# Patient Record
Sex: Female | Born: 1990 | Race: Black or African American | Hispanic: No | Marital: Single | Smoking: Never smoker
Health system: Southern US, Community
[De-identification: ages and names within clinical notes are randomized; demographics above are authoritative.]

## PROBLEM LIST (undated history)

## (undated) ENCOUNTER — Inpatient Hospital Stay (HOSPITAL_COMMUNITY): Payer: Self-pay

## (undated) DIAGNOSIS — F419 Anxiety disorder, unspecified: Secondary | ICD-10-CM

## (undated) DIAGNOSIS — R519 Headache, unspecified: Secondary | ICD-10-CM

## (undated) DIAGNOSIS — R51 Headache: Secondary | ICD-10-CM

## (undated) DIAGNOSIS — B999 Unspecified infectious disease: Secondary | ICD-10-CM

## (undated) DIAGNOSIS — A749 Chlamydial infection, unspecified: Secondary | ICD-10-CM

## (undated) DIAGNOSIS — D649 Anemia, unspecified: Secondary | ICD-10-CM

## (undated) DIAGNOSIS — N83209 Unspecified ovarian cyst, unspecified side: Secondary | ICD-10-CM

## (undated) HISTORY — PX: INDUCED ABORTION: SHX677

---
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
## 2012-01-09 ENCOUNTER — Ambulatory Visit (HOSPITAL_COMMUNITY)
Admission: RE | Admit: 2012-01-09 | Discharge: 2012-01-09 | Disposition: A | Discharge status: Home / Self Care | Payer: Medicaid Other | Source: Ambulatory Visit | Attending: Obstetrics and Gynecology | Admitting: Obstetrics and Gynecology

## 2012-01-09 VITALS — BP 110/71 | HR 95 | Wt 199.0 lb

## 2012-01-09 DIAGNOSIS — O358XX0 Maternal care for other (suspected) fetal abnormality and damage, not applicable or unspecified: Secondary | ICD-10-CM | POA: Insufficient documentation

## 2012-01-09 DIAGNOSIS — Z363 Encounter for antenatal screening for malformations: Secondary | ICD-10-CM | POA: Insufficient documentation

## 2012-01-09 DIAGNOSIS — Z1389 Encounter for screening for other disorder: Secondary | ICD-10-CM | POA: Insufficient documentation

## 2012-01-09 DIAGNOSIS — O3500X0 Maternal care for (suspected) central nervous system malformation or damage in fetus, unspecified, not applicable or unspecified: Secondary | ICD-10-CM | POA: Insufficient documentation

## 2012-01-09 DIAGNOSIS — O350XX0 Maternal care for (suspected) central nervous system malformation in fetus, not applicable or unspecified: Secondary | ICD-10-CM | POA: Insufficient documentation

## 2012-01-09 DIAGNOSIS — O2690 Pregnancy related conditions, unspecified, unspecified trimester: Secondary | ICD-10-CM

## 2012-01-09 NOTE — ED Notes (Signed)
Patient reports positive fetal movement.  Denies any contractions, leaking or bleeding, HA, VD.

## 2012-01-09 NOTE — Progress Notes (Signed)
Patient seen today  for follow up ultrasound.  See full report in [facility].  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 The fetal heart axis appears somewhat deviated to the right - the [facility] and outflow tracts otherwise appear normal. Normal amniotic fluid volume  Appropriate interval growth with EFW at the 28th %tile  Patient has appointment with Peds surgery in early June.  Referral placed for Peds Cardiology fetal echo.  Recommend follow up in 4 weeks for interval growth scan.

## 2012-02-06 ENCOUNTER — Encounter (HOSPITAL_COMMUNITY): Payer: Self-pay | Admitting: *Deleted

## 2012-02-06 ENCOUNTER — Encounter (HOSPITAL_COMMUNITY): Payer: Self-pay

## 2012-02-06 ENCOUNTER — Inpatient Hospital Stay (HOSPITAL_COMMUNITY)
Admission: AD | Admit: 2012-02-06 | Discharge: 2012-02-07 | Disposition: A | Discharge status: Home / Self Care | Payer: Medicaid Other | Source: Ambulatory Visit | Attending: Obstetrics & Gynecology | Admitting: Obstetrics & Gynecology

## 2012-02-06 ENCOUNTER — Other Ambulatory Visit (HOSPITAL_COMMUNITY): Payer: Self-pay | Admitting: Maternal and Fetal Medicine

## 2012-02-06 ENCOUNTER — Ambulatory Visit (HOSPITAL_COMMUNITY)
Admission: RE | Admit: 2012-02-06 | Discharge: 2012-02-06 | Disposition: A | Discharge status: Home / Self Care | Payer: Medicaid Other | Source: Ambulatory Visit | Attending: Obstetrics and Gynecology | Admitting: Obstetrics and Gynecology

## 2012-02-06 VITALS — BP 107/72 | HR 106 | Wt 207.0 lb

## 2012-02-06 DIAGNOSIS — O3500X0 Maternal care for (suspected) central nervous system malformation or damage in fetus, unspecified, not applicable or unspecified: Secondary | ICD-10-CM | POA: Insufficient documentation

## 2012-02-06 DIAGNOSIS — R109 Unspecified abdominal pain: Secondary | ICD-10-CM

## 2012-02-06 DIAGNOSIS — O358XX0 Maternal care for other (suspected) fetal abnormality and damage, not applicable or unspecified: Secondary | ICD-10-CM | POA: Insufficient documentation

## 2012-02-06 DIAGNOSIS — O26899 Other specified pregnancy related conditions, unspecified trimester: Secondary | ICD-10-CM

## 2012-02-06 DIAGNOSIS — O350XX0 Maternal care for (suspected) central nervous system malformation in fetus, not applicable or unspecified: Secondary | ICD-10-CM | POA: Insufficient documentation

## 2012-02-06 DIAGNOSIS — O99891 Other specified diseases and conditions complicating pregnancy: Secondary | ICD-10-CM | POA: Insufficient documentation

## 2012-02-06 LAB — URINALYSIS, ROUTINE W REFLEX MICROSCOPIC
Bilirubin Urine: NEGATIVE
Glucose, UA: NEGATIVE mg/dL
Nitrite: NEGATIVE
Specific Gravity, Urine: >1.03 — ABNORMAL HIGH (ref 1.005–1.030)
pH: 6.5 (ref 5.0–8.0)

## 2012-02-06 NOTE — MAU Provider Note (Signed)
History     CSN: [id number]  Arrival date & time 02/06/12  2108   None     Chief Complaint  Patient presents with  . Abdominal Pain    HPI Maria Olson is a 21 y.o. female @ [redacted]w[redacted]d gestation who presents to MAU for abdominal pain. The pain started tonight approximately 7 pm. She describes the pain as cramping that comes and goes. The pain is better now than when first started. She rates her pain as 6/10. Sitting make the pain worse and is better with lying down. Took one 500 mg tylenol for pain without relief. Denies nausea, vomiting but does have a headache. Last prenatal visit was 3 or 4 weeks ago with Dr. Dareen Piano. The history was provided by the patient.   Past Medical History  Diagnosis Date  . No pertinent past medical history     Past Surgical History  Procedure Date  . No past surgeries     Family History  Problem Relation Age of Onset  . Asthma Brother     History  Substance Use Topics  . Smoking status: Never Smoker   . Smokeless tobacco: Never Used  . Alcohol Use: No    OB History    Grav Para Term Preterm Abortions TAB SAB Ect Mult Living   2 1 1  0 0 0 0 0 0 1      Review of Systems  Constitutional: Negative for fever, chills, diaphoresis and fatigue.  HENT: Negative for ear pain, congestion, sore throat, facial swelling, neck pain, neck stiffness, dental problem and sinus pressure.   Eyes: Negative for photophobia, pain, discharge and visual disturbance.  Respiratory: Negative for cough, chest tightness and wheezing.   Cardiovascular: Negative for chest pain, palpitations and leg swelling.  Gastrointestinal: Negative for nausea, vomiting, abdominal pain, diarrhea, constipation and abdominal distention.  Genitourinary: Positive for frequency. Negative for dysuria, flank pain, vaginal bleeding, vaginal discharge and difficulty urinating.  Musculoskeletal: Positive for back pain. Negative for myalgias and gait problem.  Skin: Negative for color change  and rash.  Neurological: Positive for headaches. Negative for dizziness, speech difficulty, weakness, light-headedness and numbness.  Psychiatric/Behavioral: Negative for confusion and agitation. The patient is not nervous/anxious.     Allergies  Review of patient's allergies indicates no known allergies.  Home Medications  No current outpatient prescriptions on file.  BP 108/59  Pulse 90  Temp 98.7 F (37.1 C) (Oral)  Resp 20  Ht 5\' 2"  (1.575 m)  Wt 210 lb (95.255 kg)  BMI 38.41 kg/m2  SpO2 99%  LMP 08/06/2011  Physical Exam  Nursing note and vitals reviewed. Constitutional: She is oriented to person, place, and time. She appears well-developed and well-nourished.  HENT:  Head: Normocephalic.  Eyes: EOM are normal.  Neck: Neck supple.  Cardiovascular: Normal rate.   Pulmonary/Chest: Effort normal.  Abdominal: Soft. There is no tenderness.  Musculoskeletal: Normal range of motion.  Neurological: She is alert and oriented to person, place, and time. No cranial nerve deficit.  Skin: Skin is warm and dry.  Psychiatric: She has a normal mood and affect. Her behavior is normal. Judgment and thought content normal.   Results for orders placed during the hospital encounter of 02/06/12 (from the past 24 hour(s))  URINALYSIS, ROUTINE W REFLEX MICROSCOPIC     Status: Abnormal   Collection Time   02/06/12  9:20 PM      Component Value Range   Color, Urine YELLOW  YELLOW   APPearance  CLEAR  CLEAR   Specific Gravity, Urine >1.030 (*) 1.005 - 1.030   pH 6.5  5.0 - 8.0   Glucose, UA NEGATIVE  NEGATIVE mg/dL   Hgb urine dipstick NEGATIVE  NEGATIVE   Bilirubin Urine NEGATIVE  NEGATIVE   Ketones, ur NEGATIVE  NEGATIVE mg/dL   Protein, ur NEGATIVE  NEGATIVE mg/dL   Urobilinogen, UA 0.2  0.0 - 1.0 mg/dL   Nitrite NEGATIVE  NEGATIVE   Leukocytes, UA NEGATIVE  NEGATIVE   EFM: Base line 150 moderate variability, 15 x 15, one ? Deceleration but patient had moved from lying to sitting  tracing before and after is  Reactive.  ED Course: I spoke with Dr. Arlyce Dice and he request I check the patient's cervix and if closed will have her go home and follow up in the office.   Procedures   Cervical exam: Closed, thick, high.  Assessment: 21 y.o. @ [redacted]w[redacted]d with abdominal pain   Round ligament pain  Plan:  Rest, po fluids   Flexeril Rx   Follow up in the office, return here as needed. MDM

## 2012-02-06 NOTE — MAU Note (Signed)
Pt reports lower abd pain x 2 hours, denies vaginal bleeding . Denies dysuria.

## 2012-02-07 MED ORDER — CYCLOBENZAPRINE HCL 10 MG PO TABS: 5.0000 mg | ORAL_TABLET | Freq: Two times a day (BID) | ORAL | Status: AC | PRN

## 2012-02-07 MED ORDER — CYCLOBENZAPRINE HCL 10 MG PO TABS
5.0000 mg | ORAL_TABLET | Freq: Once | ORAL | Status: AC
  Administered 2012-02-07: 5 mg via ORAL
  Filled 2012-02-07: qty 2

## 2012-02-07 NOTE — Discharge Instructions (Signed)
Abdominal Pain During Pregnancy Abdominal discomfort is common in pregnancy. Most of the time, it does not cause harm. There are many causes of abdominal pain. Some causes are more serious than others. Some of the causes of abdominal pain in pregnancy are easily diagnosed. Occasionally, the diagnosis takes time to understand. Other times, the cause is not determined. Abdominal pain can be a sign that something is very wrong with the pregnancy, or the pain may have nothing to do with the pregnancy at all. For this reason, always tell your caregiver if you have any abdominal discomfort. CAUSES Common and harmless causes of abdominal pain include:  Constipation.   Excess gas and bloating.   Round ligament pain. This is pain that is felt in the folds of the groin.   The position the baby or placenta is in.   Baby kicks.   Braxton-Hicks contractions. These are mild contractions that do not cause cervical dilation.  Serious causes of abdominal pain include:  Ectopic pregnancy. This happens when a fertilized egg implants outside of the uterus.   Miscarriage.   Preterm labor. This is when labor starts at less than 37 weeks of pregnancy.   Placental abruption. This is when the placenta partially or completely separates from the uterus.   Preeclampsia. This is often associated with high blood pressure and has been referred to as "toxemia in pregnancy."   Uterine or amniotic fluid infections.  Causes unrelated to pregnancy include:  Urinary tract infection.   Gallbladder stones or inflammation.   Hepatitis or other liver illness.   Intestinal problems, stomach flu, food poisoning, or ulcer.   Appendicitis.   Kidney (renal) stones.   Kidney infection (pylonephritis).  HOME CARE INSTRUCTIONS  For mild pain:  Do not have sexual intercourse or put anything in your vagina until your symptoms go away completely.   Get plenty of rest until your pain improves. If your pain does not  improve in 1 hour, call your caregiver.   Drink clear fluids if you feel nauseous. Avoid solid food as long as you are uncomfortable or nauseous.   Only take medicine as directed by your caregiver.   Keep all follow-up appointments with your caregiver.  SEEK IMMEDIATE MEDICAL CARE IF:  You are bleeding, leaking fluid, or passing tissue from the vagina.   You have increasing pain or cramping.   You have persistent vomiting.   You have painful or bloody urination.   You have a fever.   You notice a decrease in your baby's movements.   You have extreme weakness or feel faint.   You have shortness of breath, with or without abdominal pain.   You develop a severe headache with abdominal pain.   You have abnormal vaginal discharge with abdominal pain.   You have persistent diarrhea.   You have abdominal pain that continues even after rest, or gets worse.  MAKE SURE YOU:   Understand these instructions.   Will watch your condition.   Will get help right away if you are not doing well or get worse.  Document Released: 08/05/2005 Document Revised: 07/25/2011 Document Reviewed: 03/01/2011 Exit[employer] Patient Information 2012 [employer].Abdominal Pain During Pregnancy Abdominal discomfort is common in pregnancy. Most of the time, it does not cause harm. There are many causes of abdominal pain. Some causes are more serious than others. Some of the causes of abdominal pain in pregnancy are easily diagnosed. Occasionally, the diagnosis takes time to understand. Other times, the cause is not determined.   Abdominal pain can be a sign that something is very wrong with the pregnancy, or the pain may have nothing to do with the pregnancy at all. For this reason, always tell your caregiver if you have any abdominal discomfort. CAUSES Common and harmless causes of abdominal pain include:  Constipation.   Excess gas and bloating.   Round ligament pain. This is pain that is felt in the  folds of the groin.   The position the baby or placenta is in.   Baby kicks.   Braxton-Hicks contractions. These are mild contractions that do not cause cervical dilation.  Serious causes of abdominal pain include:  Ectopic pregnancy. This happens when a fertilized egg implants outside of the uterus.   Miscarriage.   Preterm labor. This is when labor starts at less than 37 weeks of pregnancy.   Placental abruption. This is when the placenta partially or completely separates from the uterus.   Preeclampsia. This is often associated with high blood pressure and has been referred to as "toxemia in pregnancy."   Uterine or amniotic fluid infections.  Causes unrelated to pregnancy include:  Urinary tract infection.   Gallbladder stones or inflammation.   Hepatitis or other liver illness.   Intestinal problems, stomach flu, food poisoning, or ulcer.   Appendicitis.   Kidney (renal) stones.   Kidney infection (pylonephritis).  HOME CARE INSTRUCTIONS  For mild pain:  Do not have sexual intercourse or put anything in your vagina until your symptoms go away completely.   Get plenty of rest until your pain improves. If your pain does not improve in 1 hour, call your caregiver.   Drink clear fluids if you feel nauseous. Avoid solid food as long as you are uncomfortable or nauseous.   Only take medicine as directed by your caregiver.   Keep all follow-up appointments with your caregiver.  SEEK IMMEDIATE MEDICAL CARE IF:  You are bleeding, leaking fluid, or passing tissue from the vagina.   You have increasing pain or cramping.   You have persistent vomiting.   You have painful or bloody urination.   You have a fever.   You notice a decrease in your baby's movements.   You have extreme weakness or feel faint.   You have shortness of breath, with or without abdominal pain.   You develop a severe headache with abdominal pain.   You have abnormal vaginal discharge  with abdominal pain.   You have persistent diarrhea.   You have abdominal pain that continues even after rest, or gets worse.  MAKE SURE YOU:   Understand these instructions.   Will watch your condition.   Will get help right away if you are not doing well or get worse.  Document Released: 08/05/2005 Document Revised: 07/25/2011 Document Reviewed: 03/01/2011 Exit[employer] Patient Information 2012 [employer]. 

## 2012-02-27 ENCOUNTER — Ambulatory Visit (HOSPITAL_COMMUNITY): Admission: RE | Admit: 2012-02-27 | Payer: Medicaid Other | Source: Ambulatory Visit

## 2012-03-02 ENCOUNTER — Ambulatory Visit (HOSPITAL_COMMUNITY)
Admission: RE | Admit: 2012-03-02 | Discharge: 2012-03-02 | Disposition: A | Discharge status: Home / Self Care | Payer: Medicaid Other | Source: Ambulatory Visit | Attending: Obstetrics and Gynecology | Admitting: Obstetrics and Gynecology

## 2012-03-02 VITALS — BP 111/70 | HR 97 | Wt 212.5 lb

## 2012-03-02 DIAGNOSIS — O350XX0 Maternal care for (suspected) central nervous system malformation in fetus, not applicable or unspecified: Secondary | ICD-10-CM | POA: Insufficient documentation

## 2012-03-02 DIAGNOSIS — O3500X0 Maternal care for (suspected) central nervous system malformation or damage in fetus, unspecified, not applicable or unspecified: Secondary | ICD-10-CM | POA: Insufficient documentation

## 2012-03-02 DIAGNOSIS — O358XX0 Maternal care for other (suspected) fetal abnormality and damage, not applicable or unspecified: Secondary | ICD-10-CM | POA: Insufficient documentation

## 2012-03-02 NOTE — Progress Notes (Signed)
Patient seen today  for ultrasound.  See full report in [facility].  Alpha Gula, MD  IUP at 29+6 weeks Gastroschisis All other interval fetal anatomy was seen and appeared normal   Normal amniotic fluid volume  Appropriate interval growth with EFW at the 58th %tile   Follow-up ultrasound for growth in 3 weeks  for interval growth. Recommend beginning NSTs at 32 weeks Patient was previously seen by Peds surgery at [facility].  Will make arrangements for transfer of care.

## 2012-03-11 ENCOUNTER — Inpatient Hospital Stay (HOSPITAL_COMMUNITY)
Admission: AD | Admit: 2012-03-11 | Discharge: 2012-03-12 | Disposition: A | Discharge status: Home / Self Care | Payer: Medicaid Other | Source: Ambulatory Visit | Attending: Obstetrics and Gynecology | Admitting: Obstetrics and Gynecology

## 2012-03-11 ENCOUNTER — Encounter (HOSPITAL_COMMUNITY): Payer: Self-pay | Admitting: *Deleted

## 2012-03-11 DIAGNOSIS — O4700 False labor before 37 completed weeks of gestation, unspecified trimester: Secondary | ICD-10-CM | POA: Insufficient documentation

## 2012-03-11 LAB — URINE MICROSCOPIC-ADD ON

## 2012-03-11 LAB — URINALYSIS, ROUTINE W REFLEX MICROSCOPIC
Hgb urine dipstick: NEGATIVE
Nitrite: NEGATIVE
Specific Gravity, Urine: 1.02 (ref 1.005–1.030)
Urobilinogen, UA: 1 mg/dL (ref 0.0–1.0)

## 2012-03-11 MED ORDER — TERBUTALINE SULFATE 1 MG/ML IJ SOLN
0.2500 mg | Freq: Once | INTRAMUSCULAR | Status: AC
  Administered 2012-03-11: 0.25 mg via SUBCUTANEOUS
  Filled 2012-03-11: qty 1

## 2012-03-11 NOTE — MAU Note (Signed)
IS SCH  FOR NST ON 7-29- WITH MFM,   ON 7-30- DR Dareen Piano- OFFICE  APPOINTMENT.    IN 8-1- ANOTHER NST  AT MFM.Marland Kitchen  DELIVERY PLAN - IS TO DELIVER AT [facility]- THEN TAKE BABY TO [facility]- FOR SURGERY

## 2012-03-11 NOTE — MAU Note (Signed)
Pt G2 P1 at 31.1wks having contractions since 1900.  Leaking clear fluid x 1 hr.  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

## 2012-03-11 NOTE — MAU Provider Note (Signed)
History     CSN: [id number]  Arrival date and time: 03/11/12 2234   None     Chief Complaint  Patient presents with  . Contractions   HPI Maria Olson is s 21 y.o. female @ [redacted]w[redacted]d gestation who presents to MAU for ? ROM and contractions. Contractions started approximately 15:00. Leaking of fluid approximately 22:00. The history was provided by the patient.   OB History    Grav Para Term Preterm Abortions TAB SAB Ect Mult Living   2 1 1  0 0 0 0 0 0 1      Past Medical History  Diagnosis Date  . No pertinent past medical history     Past Surgical History  Procedure Date  . No past surgeries     Family History  Problem Relation Age of Onset  . Asthma Brother     History  Substance Use Topics  . Smoking status: Never Smoker   . Smokeless tobacco: Never Used  . Alcohol Use: No    Allergies: No Known Allergies  Prescriptions prior to admission  Medication Sig Dispense Refill  . Prenatal Vit-Fe Fumarate-FA (PRENATAL MULTIVITAMIN) TABS Take 1 tablet by mouth daily.        Review of Systems  Constitutional: Negative for fever, chills and weight loss.  HENT: Negative for ear pain, nosebleeds, congestion, sore throat and neck pain.   Eyes: Negative for blurred vision, double vision, photophobia and pain.  Respiratory: Negative for cough, shortness of breath and wheezing.   Cardiovascular: Negative for chest pain, palpitations and leg swelling.  Gastrointestinal: Positive for heartburn and abdominal pain. Negative for nausea, vomiting, diarrhea and constipation.  Genitourinary: Negative for dysuria, urgency and frequency.  Musculoskeletal: Positive for back pain. Negative for myalgias.  Skin: Negative for itching and rash.  Neurological: Negative for dizziness, sensory change, speech change, seizures, weakness and headaches.  Endo/Heme/Allergies: Does not bruise/bleed easily.  Psychiatric/Behavioral: Negative for depression. The patient is not nervous/anxious and  does not have insomnia.    Physical Exam   Blood pressure 122/66, pulse 95, temperature 98.2 F (36.8 C), temperature source Oral, resp. rate 18, height 5\' 3"  (1.6 m), weight 207 lb 6.4 oz (94.076 kg), last menstrual period 08/06/2011, unknown if currently breastfeeding.  Physical Exam  Nursing note and vitals reviewed. Constitutional: She is oriented to person, place, and time. She appears well-developed and well-nourished. No distress.  HENT:  Head: Normocephalic and atraumatic.  Eyes: EOM are normal.  Neck: Neck supple.  Cardiovascular: Normal rate.   Respiratory: Effort normal.  GI: Soft. There is no tenderness.  Genitourinary:       External genitalia without lesions. White discharge vaginal vault. No pooling noted. Cervix 1.5 cm, 50% ballottable.    Musculoskeletal: Normal range of motion.  Neurological: She is alert and oriented to person, place, and time.  Skin: Skin is warm and dry.  Psychiatric: She has a normal mood and affect. Her behavior is normal. Judgment and thought content normal.   Results for orders placed during the hospital encounter of 03/11/12 (from the past 24 hour(s))  URINALYSIS, ROUTINE W REFLEX MICROSCOPIC     Status: Abnormal   Collection Time   03/11/12 10:54 PM      Component Value Range   Color, Urine YELLOW  YELLOW   APPearance CLEAR  CLEAR   Specific Gravity, Urine 1.020  1.005 - 1.030   pH 6.0  5.0 - 8.0   Glucose, UA NEGATIVE  NEGATIVE mg/dL  Hgb urine dipstick NEGATIVE  NEGATIVE   Bilirubin Urine NEGATIVE  NEGATIVE   Ketones, ur 15 (*) NEGATIVE mg/dL   Protein, ur NEGATIVE  NEGATIVE mg/dL   Urobilinogen, UA 1.0  0.0 - 1.0 mg/dL   Nitrite NEGATIVE  NEGATIVE   Leukocytes, UA SMALL (*) NEGATIVE  URINE MICROSCOPIC-ADD ON     Status: Abnormal   Collection Time   03/11/12 10:54 PM      Component Value Range   Squamous Epithelial / LPF FEW (*) RARE   WBC, UA 3-6  <3 WBC/hpf   RBC / HPF 0-2  <3 RBC/hpf   Bacteria, UA FEW (*) RARE  AMNISURE  RUPTURE OF MEMBRANE (ROM)     Status: Normal   Collection Time   03/11/12 11:45 PM      Component Value Range   Amnisure ROM NEGATIVE     00:20 EFM Baseline 140, contractions every 6 to 7 minutes initially. Dr. Dareen Piano ordered terb. 23:30.  Contractions have stopped. Consult with Dr. Dareen Piano. Since contractions have stopped and amnisure is negative will instruct patient to drink plenty of fluids, pelvic rest and follow up in the office tomorrow.   Procedures Assessment and Plan  21 y.o. female @ 105w2d gestation with preterm contractions.   Plan: Increase po fluids  Pelvic rest  Follow up in the office later today.  NEESE,HOPE 03/11/2012, 11:34 PM

## 2012-03-12 LAB — AMNISURE RUPTURE OF MEMBRANE (ROM) NOT AT ARMC: Amnisure ROM: NEGATIVE

## 2012-03-14 LAB — URINE CULTURE: Colony Count: 40000

## 2012-03-16 ENCOUNTER — Ambulatory Visit (HOSPITAL_COMMUNITY)
Admission: RE | Admit: 2012-03-16 | Discharge: 2012-03-16 | Disposition: A | Discharge status: Home / Self Care | Payer: Medicaid Other | Source: Ambulatory Visit | Attending: Obstetrics and Gynecology | Admitting: Obstetrics and Gynecology

## 2012-03-16 DIAGNOSIS — O3500X0 Maternal care for (suspected) central nervous system malformation or damage in fetus, unspecified, not applicable or unspecified: Secondary | ICD-10-CM | POA: Insufficient documentation

## 2012-03-16 DIAGNOSIS — O350XX0 Maternal care for (suspected) central nervous system malformation in fetus, not applicable or unspecified: Secondary | ICD-10-CM | POA: Insufficient documentation

## 2012-03-16 DIAGNOSIS — O358XX0 Maternal care for other (suspected) fetal abnormality and damage, not applicable or unspecified: Secondary | ICD-10-CM | POA: Insufficient documentation

## 2012-03-19 ENCOUNTER — Ambulatory Visit (HOSPITAL_COMMUNITY)
Admission: RE | Admit: 2012-03-19 | Discharge: 2012-03-19 | Disposition: A | Discharge status: Home / Self Care | Payer: Medicaid Other | Source: Ambulatory Visit | Attending: Obstetrics and Gynecology | Admitting: Obstetrics and Gynecology

## 2012-03-19 ENCOUNTER — Other Ambulatory Visit (HOSPITAL_COMMUNITY): Payer: Self-pay | Admitting: Maternal and Fetal Medicine

## 2012-03-19 DIAGNOSIS — O358XX0 Maternal care for other (suspected) fetal abnormality and damage, not applicable or unspecified: Secondary | ICD-10-CM

## 2012-03-19 DIAGNOSIS — O350XX0 Maternal care for (suspected) central nervous system malformation in fetus, not applicable or unspecified: Secondary | ICD-10-CM | POA: Insufficient documentation

## 2012-03-19 DIAGNOSIS — O3500X0 Maternal care for (suspected) central nervous system malformation or damage in fetus, unspecified, not applicable or unspecified: Secondary | ICD-10-CM | POA: Insufficient documentation

## 2012-03-23 ENCOUNTER — Other Ambulatory Visit (HOSPITAL_COMMUNITY): Payer: Medicaid Other

## 2012-03-24 ENCOUNTER — Other Ambulatory Visit (HOSPITAL_COMMUNITY): Payer: Self-pay | Admitting: Maternal and Fetal Medicine

## 2012-03-24 ENCOUNTER — Ambulatory Visit (HOSPITAL_COMMUNITY)
Admission: RE | Admit: 2012-03-24 | Discharge: 2012-03-24 | Disposition: A | Discharge status: Home / Self Care | Payer: Medicaid Other | Source: Ambulatory Visit | Attending: Obstetrics and Gynecology | Admitting: Obstetrics and Gynecology

## 2012-03-24 ENCOUNTER — Encounter (HOSPITAL_COMMUNITY): Payer: Self-pay

## 2012-03-24 ENCOUNTER — Ambulatory Visit (HOSPITAL_COMMUNITY)
Admission: RE | Admit: 2012-03-24 | Discharge: 2012-03-24 | Disposition: A | Discharge status: Home / Self Care | Payer: Medicaid Other | Source: Ambulatory Visit | Attending: Maternal and Fetal Medicine | Admitting: Maternal and Fetal Medicine

## 2012-03-24 DIAGNOSIS — O3500X0 Maternal care for (suspected) central nervous system malformation or damage in fetus, unspecified, not applicable or unspecified: Secondary | ICD-10-CM | POA: Insufficient documentation

## 2012-03-24 DIAGNOSIS — O288 Other abnormal findings on antenatal screening of mother: Secondary | ICD-10-CM

## 2012-03-24 DIAGNOSIS — O350XX0 Maternal care for (suspected) central nervous system malformation in fetus, not applicable or unspecified: Secondary | ICD-10-CM | POA: Insufficient documentation

## 2012-03-24 DIAGNOSIS — O358XX0 Maternal care for other (suspected) fetal abnormality and damage, not applicable or unspecified: Secondary | ICD-10-CM | POA: Insufficient documentation

## 2012-03-24 NOTE — ED Notes (Signed)
Pt concerned about ctx she was having on Sunday.  Requested VE per MD.  SVE per Dr. Otho Perl.  3cm.  Pt states no ctx today.  Instructed to be evaluated if ctx start up again.

## 2012-03-24 NOTE — Progress Notes (Signed)
Maria Olson was seen for ultrasound appointment today.  Please see [facility] report for details.

## 2012-03-26 ENCOUNTER — Ambulatory Visit (HOSPITAL_COMMUNITY): Payer: Medicaid Other

## 2012-03-31 ENCOUNTER — Ambulatory Visit (HOSPITAL_COMMUNITY): Payer: Medicaid Other

## 2012-04-03 ENCOUNTER — Ambulatory Visit (HOSPITAL_COMMUNITY): Payer: Medicaid Other

## 2012-04-03 ENCOUNTER — Other Ambulatory Visit (HOSPITAL_COMMUNITY): Payer: Medicaid Other

## 2012-04-07 ENCOUNTER — Other Ambulatory Visit (HOSPITAL_COMMUNITY): Payer: Medicaid Other

## 2012-09-19 ENCOUNTER — Encounter (HOSPITAL_COMMUNITY): Payer: Self-pay | Admitting: Emergency Medicine

## 2012-09-19 ENCOUNTER — Emergency Department (HOSPITAL_COMMUNITY)
Admission: EM | Admit: 2012-09-19 | Discharge: 2012-09-19 | Disposition: A | Discharge status: Home / Self Care | Payer: Medicaid Other | Attending: Emergency Medicine | Admitting: Emergency Medicine

## 2012-09-19 DIAGNOSIS — R51 Headache: Secondary | ICD-10-CM | POA: Insufficient documentation

## 2012-09-19 DIAGNOSIS — R112 Nausea with vomiting, unspecified: Secondary | ICD-10-CM | POA: Insufficient documentation

## 2012-09-19 DIAGNOSIS — R05 Cough: Secondary | ICD-10-CM | POA: Insufficient documentation

## 2012-09-19 DIAGNOSIS — R6883 Chills (without fever): Secondary | ICD-10-CM | POA: Insufficient documentation

## 2012-09-19 DIAGNOSIS — IMO0001 Reserved for inherently not codable concepts without codable children: Secondary | ICD-10-CM | POA: Insufficient documentation

## 2012-09-19 DIAGNOSIS — R059 Cough, unspecified: Secondary | ICD-10-CM | POA: Insufficient documentation

## 2012-09-19 MED ORDER — HYDROCODONE-ACETAMINOPHEN 7.5-325 MG/15ML PO SOLN: 15.0000 mL | Freq: Four times a day (QID) | ORAL | Status: DC | PRN

## 2012-09-19 MED ORDER — PROMETHAZINE HCL 50 MG PO TABS: 25.0000 mg | ORAL_TABLET | Freq: Four times a day (QID) | ORAL | Status: DC | PRN

## 2012-09-19 NOTE — ED Provider Notes (Signed)
History     CSN: [id number]  Arrival date & time 09/19/12  1404   First MD Initiated Contact with Patient 09/19/12 1413      Chief Complaint  Patient presents with  . Influenza    (Consider location/radiation/quality/duration/timing/severity/associated sxs/prior treatment) HPI  Maria Olson is a 22 y.o. female complaining of Myalgia, HA, Dry cough, 2 episodes of nonbloody nonbilious emesis this a.m. Patient is now tolerating by mouth. She endorses chills and denies fevers. Denies chest pain, shortness of breath, abdominal pain, pharyngitis, rhinorrhea. She's not had a flu shot this year she has positive sick contact with her daughter having a stomach virus earlier in the week.  Past Medical History  Diagnosis Date  . No pertinent past medical history     Past Surgical History  Procedure Date  . No past surgeries     Family History  Problem Relation Age of Onset  . Asthma Brother     History  Substance Use Topics  . Smoking status: Never Smoker   . Smokeless tobacco: Never Used  . Alcohol Use: No    OB History    Grav Para Term Preterm Abortions TAB SAB Ect Mult Living   2 1 1  0 0 0 0 0 0 1      Review of Systems  Constitutional: Negative for fever.  Respiratory: Positive for cough. Negative for shortness of breath.   Cardiovascular: Negative for chest pain.  Gastrointestinal: Positive for nausea and vomiting. Negative for abdominal pain and diarrhea.  Musculoskeletal: Positive for myalgias.  All other systems reviewed and are negative.    Allergies  Review of patient's allergies indicates no known allergies.  Home Medications   Current Outpatient Rx  Name  Route  Sig  Dispense  Refill  . IBU PO   Oral   Take 2 tablets by mouth every 6 (six) hours as needed. For pain           BP 134/77  Pulse 99  Temp 99.3 F (37.4 C) (Oral)  Resp 16  SpO2 97%  LMP 09/01/2011  Breastfeeding? Unknown  Physical Exam  Nursing note and vitals  reviewed. Constitutional: She is oriented to person, place, and time. She appears well-developed and well-nourished. No distress.  HENT:  Head: Normocephalic and atraumatic.  Mouth/Throat: Oropharynx is clear and moist.  Eyes: Conjunctivae normal and EOM are normal. Pupils are equal, round, and reactive to light.  Neck: Normal range of motion.  Cardiovascular: Normal rate.   Pulmonary/Chest: Effort normal and breath sounds normal. No stridor. No respiratory distress. She has no wheezes. She has no rales. She exhibits no tenderness.  Abdominal: Soft. Bowel sounds are normal. She exhibits no distension and no mass. There is no tenderness. There is no rebound and no guarding.  Musculoskeletal: Normal range of motion.  Neurological: She is alert and oriented to person, place, and time.  Psychiatric: She has a normal mood and affect.    ED Course  Procedures (including critical care time)  Labs Reviewed - No data to display No results found.   1. Influenza-like illness       MDM  Patient with likely influenza. Doubt any complication like pneumonia. I discussed the pros and cons of Tamiflu. She defers prescription for Tamiflu at this time. I will give symptomatic control. Vital signs are stable and patient is amenable for discharge at this time.  Filed Vitals:   09/19/12 1409  BP: 134/77  Pulse: 99  Temp: 99.3 F (  37.4 C)  TempSrc: Oral  Resp: 16  SpO2: 97%     Pt verbalized understanding and agrees with care plan. Outpatient follow-up and return precautions given.    New Prescriptions   HYDROCODONE-ACETAMINOPHEN (HYCET) 7.5-325 MG/15 ML SOLUTION    Take 15 mLs by mouth every 6 (six) hours as needed for pain or cough. Maximum 90 mL per day. Do not combine with any other acetaminophen-containing solutions or tabs   PROMETHAZINE (PHENERGAN) 50 MG TABLET    Take 0.5 tablets (25 mg total) by mouth every 6 (six) hours as needed for nausea.            Wynetta Emery,  PA-C 09/20/12 1526

## 2012-09-19 NOTE — ED Notes (Signed)
Pt. Stated, I started having body aches and vomited twice this morning.

## 2012-09-22 NOTE — ED Provider Notes (Signed)
Medical screening examination/treatment/procedure(s) were performed by non-physician practitioner and as supervising physician I was immediately available for consultation/collaboration.   Suzi Roots, MD 09/22/12 [phone]

## 2012-09-26 ENCOUNTER — Encounter (HOSPITAL_COMMUNITY): Payer: Self-pay | Admitting: *Deleted

## 2012-09-26 ENCOUNTER — Inpatient Hospital Stay (HOSPITAL_COMMUNITY)
Admission: AD | Admit: 2012-09-26 | Discharge: 2012-09-26 | Disposition: A | Discharge status: Home / Self Care | Payer: Medicaid Other | Source: Ambulatory Visit | Attending: Obstetrics and Gynecology | Admitting: Obstetrics and Gynecology

## 2012-09-26 DIAGNOSIS — R509 Fever, unspecified: Secondary | ICD-10-CM | POA: Insufficient documentation

## 2012-09-26 DIAGNOSIS — N760 Acute vaginitis: Secondary | ICD-10-CM | POA: Insufficient documentation

## 2012-09-26 DIAGNOSIS — IMO0002 Reserved for concepts with insufficient information to code with codable children: Secondary | ICD-10-CM | POA: Insufficient documentation

## 2012-09-26 DIAGNOSIS — B9689 Other specified bacterial agents as the cause of diseases classified elsewhere: Secondary | ICD-10-CM | POA: Insufficient documentation

## 2012-09-26 DIAGNOSIS — A499 Bacterial infection, unspecified: Secondary | ICD-10-CM | POA: Insufficient documentation

## 2012-09-26 LAB — CBC WITH DIFFERENTIAL/PLATELET
Basophils Absolute: 0 10*3/uL (ref 0.0–0.1)
Basophils Relative: 0 % (ref 0–1)
Eosinophils Absolute: 0.1 10*3/uL (ref 0.0–0.7)
MCH: 29.6 pg (ref 26.0–34.0)
MCHC: 34 g/dL (ref 30.0–36.0)
Neutro Abs: 6.2 10*3/uL (ref 1.7–7.7)
Neutrophils Relative %: 85 % — ABNORMAL HIGH (ref 43–77)
Platelets: 318 10*3/uL (ref 150–400)
RDW: 12.7 % (ref 11.5–15.5)

## 2012-09-26 LAB — URINALYSIS, ROUTINE W REFLEX MICROSCOPIC
Glucose, UA: NEGATIVE mg/dL
Ketones, ur: NEGATIVE mg/dL
Leukocytes, UA: NEGATIVE
Nitrite: NEGATIVE
Protein, ur: NEGATIVE mg/dL

## 2012-09-26 LAB — WET PREP, GENITAL
Trich, Wet Prep: NONE SEEN
Yeast Wet Prep HPF POC: NONE SEEN

## 2012-09-26 LAB — POCT PREGNANCY, URINE: Preg Test, Ur: NEGATIVE

## 2012-09-26 MED ORDER — METRONIDAZOLE 500 MG PO TABS: 500.0000 mg | ORAL_TABLET | Freq: Two times a day (BID) | ORAL | Status: DC

## 2012-09-26 MED ORDER — ACETAMINOPHEN 500 MG PO TABS
1000.0000 mg | ORAL_TABLET | Freq: Once | ORAL | Status: AC
  Administered 2012-09-26: 1000 mg via ORAL
  Filled 2012-09-26: qty 2

## 2012-09-26 NOTE — MAU Note (Signed)
Pt reports she had and abortion 08/29/12. Stated she is having abd pain , chills , headache since last week.  Pt had same symptoms and went to [facility] and they told her she had the flu. Has not gotten any better since. Denies andy vaginal bleeding or discharge.

## 2012-09-26 NOTE — MAU Provider Note (Signed)
History     CSN: [id number]  Arrival date and time: 09/26/12 1439   None     Chief Complaint  Patient presents with  . Chills   HPI Maria Olson is 22 y.o. [phone] presenting with a feeling of being cold, temperature at home was 102.2. She had same sxs for  2 days last week.   Did not take anything for the fever because she wanted [place] to see she had one.  Nausea/vomiting last week but only slight nausea today after drinking a coke.  Had TAB at [facility] 08/29/12  by Dr. Tenny Craw.  Denies vaginal bleeding,discharge or odor.  Intercourse with condoms since given ok to return.  Is on OCPs now but plans IUD with Dr. Dareen Piano after she has a cycle.     Past Medical History  Diagnosis Date  . No pertinent past medical history   . Medical history non-contributory     Past Surgical History  Procedure Laterality Date  . No past surgeries    . Cesarean section      Family History  Problem Relation Age of Onset  . Asthma Brother     History  Substance Use Topics  . Smoking status: Never Smoker   . Smokeless tobacco: Never Used  . Alcohol Use: No    Allergies: No Known Allergies  Prescriptions prior to admission  Medication Sig Dispense Refill  . HYDROcodone-acetaminophen (HYCET) 7.5-325 mg/15 ml solution Take 15 mLs by mouth every 6 (six) hours as needed for pain or cough. Maximum 90 mL per day. Do not combine with any other acetaminophen-containing solutions or tabs  120 mL  0  . ibuprofen (ADVIL,MOTRIN) 200 MG tablet Take 400 mg by mouth every 6 (six) hours as needed (pain).      . promethazine (PHENERGAN) 50 MG tablet Take 0.5 tablets (25 mg total) by mouth every 6 (six) hours as needed for nausea.  30 tablet  0    Review of Systems  Constitutional: Positive for fever and chills.  Eyes: Negative.   Respiratory: Negative.   Cardiovascular: Negative.   Gastrointestinal: Positive for nausea (mild after drinking coke) and abdominal pain. Negative for vomiting.   Genitourinary:       Neg for bleeding, discharge or odor  Musculoskeletal: Negative for myalgias.  Neurological: Negative for headaches.   Physical Exam   Blood pressure 111/65, pulse 121, temperature 102.1 F (38.9 C), temperature source Oral, resp. rate 18, last menstrual period 08/31/2012, unknown if currently breastfeeding.  Physical Exam  Vitals reviewed. Constitutional: She is oriented to person, place, and time. She appears well-developed and well-nourished. No distress.  HENT:  Head: Normocephalic.  Neck: Normal range of motion.  Cardiovascular: Normal rate.   Respiratory: Effort normal.  GI: Soft. She exhibits no distension and no mass. There is no tenderness. There is no rebound and no guarding.  Genitourinary: There is no rash, tenderness or lesion on the right labia. There is no rash, tenderness or lesion on the left labia. Uterus is not enlarged (mild tender). Cervix exhibits no discharge. Right adnexum displays no mass, no tenderness and no fullness. Left adnexum displays no mass, no tenderness and no fullness. No erythema or bleeding around the vagina. Vaginal discharge: normal appearing, white without odor.  Neurological: She is alert and oriented to person, place, and time.  Skin: Skin is warm and dry.  Psychiatric: She has a normal mood and affect. Her behavior is normal.   Results for orders placed during  the hospital encounter of 09/26/12 (from the past 24 hour(s))  URINALYSIS, ROUTINE W REFLEX MICROSCOPIC     Status: None   Collection Time    09/26/12  2:55 PM      Result Value Range   Color, Urine YELLOW  YELLOW   APPearance CLEAR  CLEAR   Specific Gravity, Urine 1.025  1.005 - 1.030   pH 5.5  5.0 - 8.0   Glucose, UA NEGATIVE  NEGATIVE mg/dL   Hgb urine dipstick NEGATIVE  NEGATIVE   Bilirubin Urine NEGATIVE  NEGATIVE   Ketones, ur NEGATIVE  NEGATIVE mg/dL   Protein, ur NEGATIVE  NEGATIVE mg/dL   Urobilinogen, UA 0.2  0.0 - 1.0 mg/dL   Nitrite NEGATIVE   NEGATIVE   Leukocytes, UA NEGATIVE  NEGATIVE  POCT PREGNANCY, URINE     Status: None   Collection Time    09/26/12  2:59 PM      Result Value Range   Preg Test, Ur NEGATIVE  NEGATIVE  CBC WITH DIFFERENTIAL     Status: Abnormal   Collection Time    09/26/12  3:36 PM      Result Value Range   WBC 7.2  4.0 - 10.5 K/uL   RBC 3.98  3.87 - 5.11 MIL/uL   Hemoglobin 11.8 (*) 12.0 - 15.0 g/dL   HCT 24.4 (*) 01.0 - 27.2 %   MCV 87.2  78.0 - 100.0 fL   MCH 29.6  26.0 - 34.0 pg   MCHC 34.0  30.0 - 36.0 g/dL   RDW 53.6  64.4 - 03.4 %   Platelets 318  150 - 400 K/uL   Neutrophils Relative 85 (*) 43 - 77 %   Neutro Abs 6.2  1.7 - 7.7 K/uL   Lymphocytes Relative 6 (*) 12 - 46 %   Lymphs Abs 0.5 (*) 0.7 - 4.0 K/uL   Monocytes Relative 8  3 - 12 %   Monocytes Absolute 0.6  0.1 - 1.0 K/uL   Eosinophils Relative 1  0 - 5 %   Eosinophils Absolute 0.1  0.0 - 0.7 K/uL   Basophils Relative 0  0 - 1 %   Basophils Absolute 0.0  0.0 - 0.1 K/uL  WET PREP, GENITAL     Status: Abnormal   Collection Time    09/26/12  4:07 PM      Result Value Range   Yeast Wet Prep HPF POC NONE SEEN  NONE SEEN   Trich, Wet Prep NONE SEEN  NONE SEEN   Clue Cells Wet Prep HPF POC MANY (*) NONE SEEN   WBC, Wet Prep HPF POC FEW (*) NONE SEEN   MAU Course  Procedures  GC/CHL culture to lab  MDM 13:33  Reported MSE to Dr. Dareen Piano.  Order CBC/diff and exam patient.  Tylenol 1 gram po given. 17:35  Reported labs and physical exam findings to Dr. Dareen Piano.  Order given to Treat the Bacterial vaginosis and have her follow up with Dr. Tenny Craw this week  Assessment and Plan  A:  Fever     S/P Elective Termination of Pregnancy    Bacterial vaginosis  P:  Rx for Flagyl to pharmacy      May take Tylenol for fever      Follow up with Dr. Tenny Craw this week  Matt Holmes 09/26/2012, 4:25 PM

## 2012-09-29 LAB — GC/CHLAMYDIA PROBE AMP: GC Probe RNA: NEGATIVE

## 2013-03-23 ENCOUNTER — Encounter (HOSPITAL_COMMUNITY): Payer: Self-pay | Admitting: *Deleted

## 2013-03-23 ENCOUNTER — Inpatient Hospital Stay (HOSPITAL_COMMUNITY)
Admission: AD | Admit: 2013-03-23 | Discharge: 2013-03-24 | Disposition: A | Discharge status: Home / Self Care | Payer: Medicaid Other | Source: Ambulatory Visit | Attending: Obstetrics and Gynecology | Admitting: Obstetrics and Gynecology

## 2013-03-23 DIAGNOSIS — R51 Headache: Secondary | ICD-10-CM | POA: Insufficient documentation

## 2013-03-23 DIAGNOSIS — B9689 Other specified bacterial agents as the cause of diseases classified elsewhere: Secondary | ICD-10-CM | POA: Insufficient documentation

## 2013-03-23 DIAGNOSIS — N926 Irregular menstruation, unspecified: Secondary | ICD-10-CM

## 2013-03-23 DIAGNOSIS — A499 Bacterial infection, unspecified: Secondary | ICD-10-CM | POA: Insufficient documentation

## 2013-03-23 DIAGNOSIS — R109 Unspecified abdominal pain: Secondary | ICD-10-CM | POA: Insufficient documentation

## 2013-03-23 DIAGNOSIS — N760 Acute vaginitis: Secondary | ICD-10-CM | POA: Insufficient documentation

## 2013-03-23 DIAGNOSIS — N949 Unspecified condition associated with female genital organs and menstrual cycle: Secondary | ICD-10-CM | POA: Insufficient documentation

## 2013-03-23 NOTE — MAU Note (Signed)
Pt reports cramping and watery discharge x 2-3 weeks, having a "whole bunch of headaches", and this am she felt nauseated. LMP 02/01/2013, has had negative preg test at home.

## 2013-03-24 DIAGNOSIS — A499 Bacterial infection, unspecified: Secondary | ICD-10-CM

## 2013-03-24 DIAGNOSIS — N760 Acute vaginitis: Secondary | ICD-10-CM

## 2013-03-24 LAB — WET PREP, GENITAL
Trich, Wet Prep: NONE SEEN
Yeast Wet Prep HPF POC: NONE SEEN

## 2013-03-24 LAB — URINALYSIS, ROUTINE W REFLEX MICROSCOPIC
Bilirubin Urine: NEGATIVE
Glucose, UA: NEGATIVE mg/dL
Hgb urine dipstick: NEGATIVE
Nitrite: NEGATIVE
Specific Gravity, Urine: 1.025 (ref 1.005–1.030)
pH: 6.5 (ref 5.0–8.0)

## 2013-03-24 LAB — GC/CHLAMYDIA PROBE AMP: GC Probe RNA: NEGATIVE

## 2013-03-24 MED ORDER — BUTALBITAL-APAP-CAFFEINE 50-325-40 MG PO TABS
2.0000 | ORAL_TABLET | Freq: Once | ORAL | Status: DC
  Filled 2013-03-24: qty 2

## 2013-03-24 MED ORDER — METRONIDAZOLE 500 MG PO TABS: 500.0000 mg | ORAL_TABLET | Freq: Two times a day (BID) | ORAL | Status: DC

## 2013-03-24 MED ORDER — BUTALBITAL-APAP-CAFFEINE 50-325-40 MG PO TABS: 1.0000 | ORAL_TABLET | Freq: Four times a day (QID) | ORAL | Status: DC | PRN

## 2013-03-24 NOTE — MAU Provider Note (Signed)
Chief Complaint: Abdominal Cramping and Vaginal Discharge  First Provider Initiated Contact with Patient 03/24/13 0040     SUBJECTIVE HPI: Maria Olson is a 22 y.o. [id number] female who presents with: 1. Mild-mod cramping x 1 week. 2. Nausea x 1 this morning. 3. Short, light period at end of July.  4. Vaginal discharge. 5. Chronic HA's  She is concerned that she may be pregnant. She is sexually active, no BC. Patient's last normal menstrual period was 02/01/2013. Neg home UPT. Also reports chronic HA's. None now.   Past Medical History  Diagnosis Date  . No pertinent past medical history   . Medical history non-contributory    OB History   Grav Para Term Preterm Abortions TAB SAB Ect Mult Living   3 2 1 1 1  0 0 0 0 2     # Outc Date GA Lbr Len/2nd Wgt Sex Del Anes PTL Lv   1 TRM 9/12 [redacted]w[redacted]d 10:26 / 01:14 3.255kg(7lb2.8oz) F SVD EPI  Yes   Comments: None   2 PRE  [redacted]w[redacted]d      Yes Yes   Comments: System Generated. Please review and update pregnancy details.   3 ABT              Past Surgical History  Procedure Laterality Date  . No past surgeries    . Cesarean section     History   Social History  . Marital Status: Single    Spouse Name: N/A    Number of Children: N/A  . Years of Education: N/A   Occupational History  . Not on file.   Social History Main Topics  . Smoking status: Never Smoker   . Smokeless tobacco: Never Used  . Alcohol Use: No  . Drug Use: No  . Sexually Active: Yes    Birth Control/ Protection: None   Other Topics Concern  . Not on file   Social History Narrative  . No narrative on file   No current facility-administered medications on file prior to encounter.   Current Outpatient Prescriptions on File Prior to Encounter  Medication Sig Dispense Refill  . ibuprofen (ADVIL,MOTRIN) 200 MG tablet Take 400 mg by mouth every 6 (six) hours as needed (pain).       No Known Allergies  ROS: Pertinent pos items in HPI. Neg for fever, chills,  urinary complaints, GI complaints, intermenstrual spotting or dyspareunia.   OBJECTIVE Blood pressure 121/78, pulse 68, temperature 98.2 F (36.8 C), temperature source Oral, resp. rate 18, height 5\' 3"  (1.6 m), weight 87.544 kg (193 lb), last menstrual period 02/01/2013, SpO2 100.00%. GENERAL: Well-developed, well-nourished female in no acute distress.  HEENT: Normocephalic HEART: normal rate RESP: normal effort ABDOMEN: Soft, NT. Pos BS x 4.  EXTREMITIES: Nontender, no edema NEURO: Alert and oriented SPECULUM EXAM: NEFG, moderate, thin, white, malodorous discharge, no blood noted, cervix clean BIMANUAL: cervix closed; uterus normal size, no adnexal tenderness or masses. No CMT.   LAB RESULTS Results for orders placed during the hospital encounter of 03/23/13 (from the past 24 hour(s))  URINALYSIS, ROUTINE W REFLEX MICROSCOPIC     Status: Abnormal   Collection Time    03/23/13 10:55 PM      Result Value Range   Color, Urine YELLOW  YELLOW   APPearance CLEAR  CLEAR   Specific Gravity, Urine 1.025  1.005 - 1.030   pH 6.5  5.0 - 8.0   Glucose, UA NEGATIVE  NEGATIVE mg/dL   Hgb urine  dipstick NEGATIVE  NEGATIVE   Bilirubin Urine NEGATIVE  NEGATIVE   Ketones, ur 15 (*) NEGATIVE mg/dL   Protein, ur NEGATIVE  NEGATIVE mg/dL   Urobilinogen, UA 1.0  0.0 - 1.0 mg/dL   Nitrite NEGATIVE  NEGATIVE   Leukocytes, UA NEGATIVE  NEGATIVE  POCT PREGNANCY, URINE     Status: None   Collection Time    03/23/13 11:08 PM      Result Value Range   Preg Test, Ur NEGATIVE  NEGATIVE  WET PREP, GENITAL     Status: Abnormal   Collection Time    03/24/13 12:50 AM      Result Value Range   Yeast Wet Prep HPF POC NONE SEEN  NONE SEEN   Trich, Wet Prep NONE SEEN  NONE SEEN   Clue Cells Wet Prep HPF POC MANY (*) NONE SEEN   WBC, Wet Prep HPF POC MODERATE (*) NONE SEEN    IMAGING No results found.  MAU COURSE Cramping 3/10 while in MAU.  ASSESSMENT 1. BV (bacterial vaginosis)   2. Chronic  headache   3. Irregular menstruation    PLAN Discharge home in stable condition. Instructed pt to take UPT if no period in next month.  GC/CT pending     Follow-up Information   Follow up with [facility] (As needed)    Contact information:   [address] [place] [address] [phone]      Follow up with Primary care provider. (As needed for headaches)        Medication List    STOP taking these medications       HYDROcodone-acetaminophen 7.5-325 mg/15 ml solution  Commonly known as:  HYCET     promethazine 50 MG tablet  Commonly known as:  PHENERGAN      TAKE these medications       butalbital-acetaminophen-caffeine 50-325-40 MG per tablet  Commonly known as:  FIORICET, ESGIC  Take 1-2 tablets by mouth every 6 (six) hours as needed for headache.     ibuprofen 200 MG tablet  Commonly known as:  ADVIL,MOTRIN  Take 400 mg by mouth every 6 (six) hours as needed (pain).     metroNIDAZOLE 500 MG tablet  Commonly known as:  FLAGYL  Take 1 tablet (500 mg total) by mouth 2 (two) times daily.       Dorathy Kinsman, CNM 03/24/2013  2:10 AM

## 2013-06-02 ENCOUNTER — Inpatient Hospital Stay (HOSPITAL_COMMUNITY)
Admission: AD | Admit: 2013-06-02 | Discharge: 2013-06-02 | Disposition: A | Discharge status: Home / Self Care | Payer: Medicaid Other | Source: Ambulatory Visit | Attending: Obstetrics and Gynecology | Admitting: Obstetrics and Gynecology

## 2013-06-02 ENCOUNTER — Encounter (HOSPITAL_COMMUNITY): Payer: Self-pay | Admitting: *Deleted

## 2013-06-02 DIAGNOSIS — B9689 Other specified bacterial agents as the cause of diseases classified elsewhere: Secondary | ICD-10-CM | POA: Insufficient documentation

## 2013-06-02 DIAGNOSIS — N760 Acute vaginitis: Secondary | ICD-10-CM

## 2013-06-02 DIAGNOSIS — B373 Candidiasis of vulva and vagina: Secondary | ICD-10-CM

## 2013-06-02 DIAGNOSIS — K120 Recurrent oral aphthae: Secondary | ICD-10-CM | POA: Insufficient documentation

## 2013-06-02 DIAGNOSIS — K5900 Constipation, unspecified: Secondary | ICD-10-CM | POA: Insufficient documentation

## 2013-06-02 DIAGNOSIS — A499 Bacterial infection, unspecified: Secondary | ICD-10-CM | POA: Insufficient documentation

## 2013-06-02 DIAGNOSIS — B3731 Acute candidiasis of vulva and vagina: Secondary | ICD-10-CM | POA: Insufficient documentation

## 2013-06-02 DIAGNOSIS — R109 Unspecified abdominal pain: Secondary | ICD-10-CM | POA: Insufficient documentation

## 2013-06-02 LAB — CBC
Hemoglobin: 12.1 g/dL (ref 12.0–15.0)
MCH: 30 pg (ref 26.0–34.0)
MCHC: 35.2 g/dL (ref 30.0–36.0)
Platelets: 308 10*3/uL (ref 150–400)
RBC: 4.04 MIL/uL (ref 3.87–5.11)

## 2013-06-02 LAB — WET PREP, GENITAL: Yeast Wet Prep HPF POC: NONE SEEN

## 2013-06-02 LAB — URINALYSIS, ROUTINE W REFLEX MICROSCOPIC
Bilirubin Urine: NEGATIVE
Hgb urine dipstick: NEGATIVE
Ketones, ur: NEGATIVE mg/dL
Leukocytes, UA: NEGATIVE
Nitrite: NEGATIVE
Protein, ur: NEGATIVE mg/dL
Specific Gravity, Urine: >1.03 — ABNORMAL HIGH (ref 1.005–1.030)
Urobilinogen, UA: 0.2 mg/dL (ref 0.0–1.0)

## 2013-06-02 LAB — POCT PREGNANCY, URINE: Preg Test, Ur: NEGATIVE

## 2013-06-02 LAB — GC/CHLAMYDIA PROBE AMP: GC Probe RNA: NEGATIVE

## 2013-06-02 MED ORDER — METRONIDAZOLE 500 MG PO TABS: 500.0000 mg | ORAL_TABLET | Freq: Two times a day (BID) | ORAL | Status: DC

## 2013-06-02 MED ORDER — FLUCONAZOLE 150 MG PO TABS
150.0000 mg | ORAL_TABLET | Freq: Once | ORAL | Status: AC
  Administered 2013-06-02: 150 mg via ORAL
  Filled 2013-06-02: qty 1

## 2013-06-02 NOTE — MAU Note (Signed)
PT SAYS SHE WENT TO DR  Maria Olson ON [address]-    FOR JOINT PAIN- SAYS WBC- LOW, VIT D- LOW,,  PREDIABETIC,   STILL HAVING ABD PAIN  - X2 MTHS - NO PELVIC EXAM.    WENT TO HD- X1 MTH AGO- HAD CX DONE -- ALL NEG.      HAS HX OF ULCERS IN MTH-  BUT NEW ONE LAST WEEK  AND AN NEW ONE TODAY.      LAST SEX- 10-11.  NO BIRTH CONTROL.

## 2013-06-02 NOTE — MAU Provider Note (Signed)
Chief Complaint: Abdominal Pain   First Provider Initiated Contact with Patient 06/02/13 0256     SUBJECTIVE HPI: Maria Olson is a 22 y.o. [phone] female who presents with intermittent focal low abdominal pain times one month and a mouth ulcer x1 week and a new one today. Rates pain 4/10 on pain scale at worst. Pain is sometimes on the left and sometimes on the right. Has history of similar low abdominal pain. No specific diagnosis. Resolved spontaneously in the past. Has been taking ibuprofen with adequate pain relief.   Past Medical History  Diagnosis Date  . No pertinent past medical history   . Medical history non-contributory    OB History  Gravida Para Term Preterm AB SAB TAB Ectopic Multiple Living  4 2 1 1 2  0 1 0 0 2    # Outcome Date GA Lbr Len/2nd Weight Sex Delivery Anes PTL Lv  4 TRM 04/23/11 [redacted]w[redacted]d 10:26 / 01:14 3.255 kg (7 lb 2.8 oz) F SVD EPI  Y     Comments: None  3 ABT           2 TAB           1 PRE  [redacted]w[redacted]d    LTCS  Y Y     Comments: System Generated. Please review and update pregnancy details.     Past Surgical History  Procedure Laterality Date  . No past surgeries    . Cesarean section     History   Social History  . Marital Status: Single    Spouse Name: N/A    Number of Children: N/A  . Years of Education: N/A   Occupational History  . Not on file.   Social History Main Topics  . Smoking status: Never Smoker   . Smokeless tobacco: Never Used  . Alcohol Use: No  . Drug Use: No  . Sexual Activity: Yes    Birth Control/ Protection: None   Other Topics Concern  . Not on file   Social History Narrative  . No narrative on file   No current facility-administered medications on file prior to encounter.   Current Outpatient Prescriptions on File Prior to Encounter  Medication Sig Dispense Refill  . ibuprofen (ADVIL,MOTRIN) 200 MG tablet Take 400 mg by mouth every 6 (six) hours as needed (pain).      . butalbital-acetaminophen-caffeine  (FIORICET, ESGIC) 50-325-40 MG per tablet Take 1-2 tablets by mouth every 6 (six) hours as needed for headache.  30 tablet  1   No Known Allergies  ROS: Positive for constipation. Last bowel movement yesterday. Has approximately 3 bowel movements per week. Negative for fever, chills, vaginal discharge, intermenstrual bleeding, dyspareunia, urinary complaints, nausea, vomiting, diarrhea.  OBJECTIVE Blood pressure 113/75, pulse 66, temperature 98.4 F (36.9 C), temperature source Oral, resp. rate 20, height 5\' 3"  (1.6 m), weight 85.73 kg (189 lb), last menstrual period 05/19/2013. GENERAL: Well-developed, well-nourished female in no acute distress.  HEENT: Normocephalic. Single 4 mm ulceration increase between anterior lower condoms and lips. No drainage. Mild erythema. Very tender. HEART: normal rate RESP: normal effort ABDOMEN: Soft, mild left lower quadrant tenderness. Negative mass, guarding or rebound. Positive bowel sounds x4. No CVA tenderness. EXTREMITIES: Nontender, no edema NEURO: Alert and oriented SPECULUM EXAM: NEFG, large amount of curd-like, malodorous discharge mixed with watery discharge, no blood noted, cervix clean. Vaginal walls erythematous. No lesions. BIMANUAL: cervix closed; uterus normal size, no adnexal tenderness or masses. No cervical motion tenderness.  LAB RESULTS Results for orders placed during the hospital encounter of 06/02/13 (from the past 24 hour(s))  URINALYSIS, ROUTINE W REFLEX MICROSCOPIC     Status: Abnormal   Collection Time    06/02/13  1:00 AM      Result Value Range   Color, Urine YELLOW  YELLOW   APPearance CLEAR  CLEAR   Specific Gravity, Urine >1.030 (*) 1.005 - 1.030   pH 6.0  5.0 - 8.0   Glucose, UA NEGATIVE  NEGATIVE mg/dL   Hgb urine dipstick NEGATIVE  NEGATIVE   Bilirubin Urine NEGATIVE  NEGATIVE   Ketones, ur NEGATIVE  NEGATIVE mg/dL   Protein, ur NEGATIVE  NEGATIVE mg/dL   Urobilinogen, UA 0.2  0.0 - 1.0 mg/dL   Nitrite NEGATIVE   NEGATIVE   Leukocytes, UA NEGATIVE  NEGATIVE  POCT PREGNANCY, URINE     Status: None   Collection Time    06/02/13  1:26 AM      Result Value Range   Preg Test, Ur NEGATIVE  NEGATIVE  CBC     Status: Abnormal   Collection Time    06/02/13  3:00 AM      Result Value Range   WBC 5.6  4.0 - 10.5 K/uL   RBC 4.04  3.87 - 5.11 MIL/uL   Hemoglobin 12.1  12.0 - 15.0 g/dL   HCT 45.4 (*) 09.8 - 11.9 %   MCV 85.1  78.0 - 100.0 fL   MCH 30.0  26.0 - 34.0 pg   MCHC 35.2  30.0 - 36.0 g/dL   RDW 14.7  82.9 - 56.2 %   Platelets 308  150 - 400 K/uL  WET PREP, GENITAL     Status: Abnormal   Collection Time    06/02/13  3:50 AM      Result Value Range   Yeast Wet Prep HPF POC NONE SEEN  NONE SEEN   Trich, Wet Prep NONE SEEN  NONE SEEN   Clue Cells Wet Prep HPF POC MANY (*) NONE SEEN   WBC, Wet Prep HPF POC FEW (*) NONE SEEN    IMAGING No results found.  MAU COURSE Diflucan given in MAU.  ASSESSMENT 1. Bacterial vaginosis   2. Constipation   3. Vulvovaginal candidiasis   4. Aphthous ulcer of mouth    PLAN Discharge home in stable condition. Orajel for aphthous ulcer.     Follow-up Information   Follow up with [facility] (As needed if symptoms persist)    Contact information:   [address] [place] [address] [phone]      Follow up with [facility] [place] [facility]. (As needed in emergencies)    Contact information:   [address] [id number] [place] [address] [phone]       Medication List         butalbital-acetaminophen-caffeine 50-325-40 MG per tablet  Commonly known as:  FIORICET, ESGIC  Take 1-2 tablets by mouth every 6 (six) hours as needed for headache.     gabapentin 300 MG capsule  Commonly known as:  NEURONTIN  Take 300 mg by mouth 3 (three) times daily.     ibuprofen 200 MG tablet  Commonly known as:  ADVIL,MOTRIN  Take 400 mg by mouth  every 6 (six) hours as needed (pain).     metroNIDAZOLE 500 MG tablet  Commonly known as:  FLAGYL  Take 1 tablet (500 mg total) by mouth  2 (two) times daily.       [place], CNM 06/02/2013  4:19 AM

## 2013-07-28 LAB — OB RESULTS CONSOLE GC/CHLAMYDIA
Chlamydia: NEGATIVE
Gonorrhea: NEGATIVE

## 2013-07-28 LAB — OB RESULTS CONSOLE ABO/RH: RH TYPE: POSITIVE

## 2013-07-28 LAB — OB RESULTS CONSOLE RPR: RPR: NONREACTIVE

## 2013-07-28 LAB — OB RESULTS CONSOLE HIV ANTIBODY (ROUTINE TESTING): HIV: NONREACTIVE

## 2013-07-28 LAB — OB RESULTS CONSOLE ANTIBODY SCREEN: Antibody Screen: NEGATIVE

## 2013-07-28 LAB — OB RESULTS CONSOLE RUBELLA ANTIBODY, IGM: Rubella: IMMUNE

## 2013-07-28 LAB — OB RESULTS CONSOLE HEPATITIS B SURFACE ANTIGEN: HEP B S AG: NEGATIVE

## 2013-08-19 NOTE — L&D Delivery Note (Signed)
Patient was C/C/+3 and pushed for 5 minutes with epidural.   NSVD  female infant, Apgars 9,9, weight P.   The patient had one small first degree perineal laceration repaired with 2-0 vicryl R. Fundus was firm. EBL was expected. Placenta was delivered intact. Vagina was clear.  Baby was vigorous and doing skin to skin with mother.  Markham Dumlao A

## 2013-10-05 ENCOUNTER — Inpatient Hospital Stay (HOSPITAL_COMMUNITY)
Admission: AD | Admit: 2013-10-05 | Discharge: 2013-10-05 | Disposition: A | Discharge status: Home / Self Care | Payer: Medicaid Other | Source: Ambulatory Visit | Attending: Obstetrics and Gynecology | Admitting: Obstetrics and Gynecology

## 2013-10-05 ENCOUNTER — Encounter (HOSPITAL_COMMUNITY): Payer: Self-pay | Admitting: *Deleted

## 2013-10-05 DIAGNOSIS — O9989 Other specified diseases and conditions complicating pregnancy, childbirth and the puerperium: Secondary | ICD-10-CM

## 2013-10-05 DIAGNOSIS — O99619 Diseases of the digestive system complicating pregnancy, unspecified trimester: Secondary | ICD-10-CM

## 2013-10-05 DIAGNOSIS — O99891 Other specified diseases and conditions complicating pregnancy: Secondary | ICD-10-CM | POA: Insufficient documentation

## 2013-10-05 DIAGNOSIS — A499 Bacterial infection, unspecified: Secondary | ICD-10-CM | POA: Insufficient documentation

## 2013-10-05 DIAGNOSIS — B9689 Other specified bacterial agents as the cause of diseases classified elsewhere: Secondary | ICD-10-CM | POA: Insufficient documentation

## 2013-10-05 DIAGNOSIS — K5900 Constipation, unspecified: Secondary | ICD-10-CM | POA: Insufficient documentation

## 2013-10-05 DIAGNOSIS — O2390 Unspecified genitourinary tract infection in pregnancy, unspecified trimester: Secondary | ICD-10-CM | POA: Insufficient documentation

## 2013-10-05 DIAGNOSIS — N760 Acute vaginitis: Secondary | ICD-10-CM | POA: Insufficient documentation

## 2013-10-05 DIAGNOSIS — M549 Dorsalgia, unspecified: Secondary | ICD-10-CM | POA: Insufficient documentation

## 2013-10-05 LAB — URINALYSIS, ROUTINE W REFLEX MICROSCOPIC
BILIRUBIN URINE: NEGATIVE
GLUCOSE, UA: NEGATIVE mg/dL
HGB URINE DIPSTICK: NEGATIVE
Ketones, ur: NEGATIVE mg/dL
Leukocytes, UA: NEGATIVE
Nitrite: NEGATIVE
Protein, ur: NEGATIVE mg/dL
Urobilinogen, UA: 0.2 mg/dL (ref 0.0–1.0)
pH: 6 (ref 5.0–8.0)

## 2013-10-05 NOTE — MAU Provider Note (Signed)
Chief Complaint: Contractions   First Provider Initiated Contact with Patient 10/05/13 1921     SUBJECTIVE HPI: Maria Olson is a 23 y.o. [id number] at [redacted]w[redacted]d by LMP who presents to maternity admissions reporting back pain described as constant, with some intermittent increases in pain.  She also reports rectal pressure intermittently with this pain. She reports regular bowel movements x2 days with constipation prior to that lasting a few days.  She is currently taking Flagyl for bacterial vaginosis.  She was diagnosed in October, then called the office and was prescribed medication for her symptoms this time.  She is feeling fetal movement, denies LOF, vaginal bleeding, vaginal itching/burning, urinary symptoms, h/a, dizziness, n/v, or fever/chills.     Past Medical History  Diagnosis Date  . No pertinent past medical history   . Medical history non-contributory    Past Surgical History  Procedure Laterality Date  . No past surgeries    . Cesarean section     History   Social History  . Marital Status: Single    Spouse Name: N/A    Number of Children: N/A  . Years of Education: N/A   Occupational History  . Not on file.   Social History Main Topics  . Smoking status: Never Smoker   . Smokeless tobacco: Never Used  . Alcohol Use: No  . Drug Use: No  . Sexual Activity: Yes    Birth Control/ Protection: None   Other Topics Concern  . Not on file   Social History Narrative  . No narrative on file   No current facility-administered medications on file prior to encounter.   No current outpatient prescriptions on file prior to encounter.   No Known Allergies  ROS: Pertinent items in HPI  OBJECTIVE Blood pressure 118/69, pulse 90, temperature 98.1 F (36.7 C), temperature source Oral, resp. rate 18, height 5\' 3"  (1.6 m), weight 92.715 kg (204 lb 6.4 oz), last menstrual period 06/05/2013, SpO2 100.00%. GENERAL: Well-developed, well-nourished female in no acute distress.   HEENT: Normocephalic HEART: normal rate RESP: normal effort ABDOMEN: Soft, non-tender EXTREMITIES: Nontender, no edema NEURO: Alert and oriented  FHR 158 by doppler  Dilation: Closed Effacement (%): Thick Exam by:: Sharen Counter CNM Large amount stool palpable in pt rectum  LAB RESULTS Results for orders placed during the hospital encounter of 10/05/13 (from the past 24 hour(s))  URINALYSIS, ROUTINE W REFLEX MICROSCOPIC     Status: Abnormal   Collection Time    10/05/13  6:50 PM      Result Value Ref Range   Color, Urine YELLOW  YELLOW   APPearance CLEAR  CLEAR   Specific Gravity, Urine >1.030 (*) 1.005 - 1.030   pH 6.0  5.0 - 8.0   Glucose, UA NEGATIVE  NEGATIVE mg/dL   Hgb urine dipstick NEGATIVE  NEGATIVE   Bilirubin Urine NEGATIVE  NEGATIVE   Ketones, ur NEGATIVE  NEGATIVE mg/dL   Protein, ur NEGATIVE  NEGATIVE mg/dL   Urobilinogen, UA 0.2  0.0 - 1.0 mg/dL   Nitrite NEGATIVE  NEGATIVE   Leukocytes, UA NEGATIVE  NEGATIVE     ASSESSMENT 1. Back pain complicating pregnancy in second trimester   2. Constipation in pregnancy     PLAN Consult Dr Henderson Cloud Discharge home Teaching done about preventing constipation including increasing fiber and fluids Colace OTC BID PRN F/U in office as scheduled Return to MAU as needed    Medication List         acetaminophen  500 MG tablet  Commonly known as:  TYLENOL  Take 500 mg by mouth every 6 (six) hours as needed for mild pain.     metroNIDAZOLE 500 MG tablet  Commonly known as:  FLAGYL  Take 500 mg by mouth 2 (two) times daily. For 7 days     prenatal multivitamin Tabs tablet  Take 1 tablet by mouth daily at 12 noon.       Follow-up Information   Follow up with [facility]   Contact information:   [address] [place] [id number] [phone]      Sharen CounterLisa Leftwich-Kirby Certified Nurse-Midwife 10/05/2013  8:17 PM

## 2013-10-05 NOTE — MAU Note (Signed)
Patient reports "contractions" irregularly that started today in lower back. Denies LOF,  Or VB at this time. States currently on medication for BV.

## 2013-10-05 NOTE — Discharge Instructions (Signed)
Back Pain in Pregnancy °Back pain during pregnancy is common. It happens in about half of all pregnancies. It is important for you and your baby that you remain active during your pregnancy. If you feel that back pain is not allowing you to remain active or sleep well, it is time to see your caregiver. Back pain may be caused by several factors related to changes during your pregnancy. Fortunately, unless you had trouble with your back before your pregnancy, the pain is likely to get better after you deliver. °Low back pain usually occurs between the fifth and seventh months of pregnancy. It can, however, happen in the first couple months. Factors that increase the risk of back problems include:  °· Previous back problems. °· Injury to your back. °· Having twins or multiple births. °· A chronic cough. °· Stress. °· Job-related repetitive motions. °· Muscle or spinal disease in the back. °· Family history of back problems, ruptured (herniated) discs, or osteoporosis. °· Depression, anxiety, and panic attacks. °CAUSES  °· When you are pregnant, your body produces a hormone called relaxin. This hormone makes the ligaments connecting the low back and pubic bones more flexible. This flexibility allows the baby to be delivered more easily. When your ligaments are loose, your muscles need to work harder to support your back. Soreness in your back can come from tired muscles. Soreness can also come from back tissues that are irritated since they are receiving less support. °· As the baby grows, it puts pressure on the nerves and blood vessels in your pelvis. This can cause back pain. °· As the baby grows and gets heavier during pregnancy, the uterus pushes the stomach muscles forward and changes your center of gravity. This makes your back muscles work harder to maintain good posture. °SYMPTOMS  °Lumbar pain during pregnancy °Lumbar pain during pregnancy usually occurs at or above the waist in the center of the back. There  may be pain and numbness that radiates into your leg or foot. This is similar to low back pain experienced by non-pregnant women. It usually increases with sitting for long periods of time, standing, or repetitive lifting. Tenderness may also be present in the muscles along your upper back. °Posterior pelvic pain during pregnancy °Pain in the back of the pelvis is more common than lumbar pain in pregnancy. It is a deep pain felt in your side at the waistline, or across the tailbone (sacrum), or in both places. You may have pain on one or both sides. This pain can also go into the buttocks and backs of the upper thighs. Pubic and groin pain may also be present. The pain does not quickly resolve with rest, and morning stiffness may also be present. °Pelvic pain during pregnancy can be brought on by most activities. A high level of fitness before and during pregnancy may or may not prevent this problem. Labor pain is usually 1 to 2 minutes apart, lasts for about 1 minute, and involves a bearing down feeling or pressure in your pelvis. However, if you are at term with the pregnancy, constant low back pain can be the beginning of early labor, and you should be aware of this. °DIAGNOSIS  °X-rays of the back should not be done during the first 12 to 14 weeks of the pregnancy and only when absolutely necessary during the rest of the pregnancy. MRIs do not give off radiation and are safe during pregnancy. MRIs also should only be done when absolutely necessary. °HOME CARE INSTRUCTIONS °· Exercise   as directed by your caregiver. Exercise is the most effective way to prevent or manage back pain. If you have a back problem, it is especially important to avoid sports that require sudden body movements. Swimming and walking are great activities. °· Do not stand in one place for long periods of time. °· Do not wear high heels. °· Sit in chairs with good posture. Use a pillow on your lower back if necessary. Make sure your head  rests over your shoulders and is not hanging forward. °· Try sleeping on your side, preferably the left side, with a pillow or two between your legs. If you are sore after a night's rest, your bed may be too soft. Try placing a board between your mattress and box spring. °· Listen to your body when lifting. If you are experiencing pain, ask for help or try bending your knees more so you can use your leg muscles rather than your back muscles. Squat down when picking up something from the floor. Do not bend over. °· Eat a healthy diet. Try to gain weight within your caregiver's recommendations. °· Use heat or cold packs 3 to 4 times a day for 15 minutes to help with the pain. °· Only take over-the-counter or prescription medicines for pain, discomfort, or fever as directed by your caregiver. °Sudden (acute) back pain °· Use bed rest for only the most extreme, acute episodes of back pain. Prolonged bed rest over 48 hours will aggravate your condition. °· Ice is very effective for acute conditions. °· Put ice in a plastic bag. °· Place a towel between your skin and the bag. °· Leave the ice on for 10 to 20 minutes every 2 hours, or as needed. °· Using heat packs for 30 minutes prior to activities is also helpful. °Continued back pain °See your caregiver if you have continued problems. Your caregiver can help or refer you for appropriate physical therapy. With conditioning, most back problems can be avoided. Sometimes, a more serious issue may be the cause of back pain. You should be seen right away if new problems seem to be developing. Your caregiver may recommend: °· A maternity girdle. °· An elastic sling. °· A back brace. °· A massage therapist or acupuncture. °SEEK MEDICAL CARE IF:  °· You are not able to do most of your daily activities, even when taking the pain medicine you were given. °· You need a referral to a physical therapist or chiropractor. °· You want to try acupuncture. °SEEK IMMEDIATE MEDICAL CARE  IF: °· You develop numbness, tingling, weakness, or problems with the use of your arms or legs. °· You develop severe back pain that is no longer relieved with medicines. °· You have a sudden change in bowel or bladder control. °· You have increasing pain in other areas of the body. °· You develop shortness of breath, dizziness, or fainting. °· You develop nausea, vomiting, or sweating. °· You have back pain which is similar to labor pains. °· You have back pain along with your water breaking or vaginal bleeding. °· You have back pain or numbness that travels down your leg. °· Your back pain developed after you fell. °· You develop pain on one side of your back. You may have a kidney stone. °· You see blood in your urine. You may have a bladder infection or kidney stone. °· You have back pain with blisters. You may have shingles. °Back pain is fairly common during pregnancy but should not be accepted as just part of   the process. Back pain should always be treated as soon as possible. This will make your pregnancy as pleasant as possible. Document Released: 11/13/2005 Document Revised: 10/28/2011 Document Reviewed: 12/25/2010 [facility] Patient Information 2014 [place], [place].  Constipation, Adult Constipation is when a person has fewer than 3 bowel movements a week; has difficulty having a bowel movement; or has stools that are dry, hard, or larger than normal. As people grow older, constipation is more common. If you try to fix constipation with medicines that make you have a bowel movement (laxatives), the problem may get worse. Long-term laxative use may cause the muscles of the colon to become weak. A low-fiber diet, not taking in enough fluids, and taking certain medicines may make constipation worse. CAUSES   Certain medicines, such as antidepressants, pain medicine, iron supplements, antacids, and water pills.   Certain diseases, such as diabetes, irritable bowel syndrome (IBS), thyroid disease, or  depression.   Not drinking enough water.   Not eating enough fiber-rich foods.   Stress or travel.  Lack of physical activity or exercise.  Not going to the restroom when there is the urge to have a bowel movement.  Ignoring the urge to have a bowel movement.  Using laxatives too much. SYMPTOMS   Having fewer than 3 bowel movements a week.   Straining to have a bowel movement.   Having hard, dry, or larger than normal stools.   Feeling full or bloated.   Pain in the lower abdomen.  Not feeling relief after having a bowel movement. DIAGNOSIS  Your caregiver will take a medical history and perform a physical exam. Further testing may be done for severe constipation. Some tests may include:   A barium enema X-ray to examine your rectum, colon, and sometimes, your small intestine.  A sigmoidoscopy to examine your lower colon.  A colonoscopy to examine your entire colon. TREATMENT  Treatment will depend on the severity of your constipation and what is causing it. Some dietary treatments include drinking more fluids and eating more fiber-rich foods. Lifestyle treatments may include regular exercise. If these diet and lifestyle recommendations do not help, your caregiver may recommend taking over-the-counter laxative medicines to help you have bowel movements. Prescription medicines may be prescribed if over-the-counter medicines do not work.  HOME CARE INSTRUCTIONS   Increase dietary fiber in your diet, such as fruits, vegetables, whole grains, and beans. Limit high-fat and processed sugars in your diet, such as [place] fries, hamburgers, cookies, candies, and soda.   A fiber supplement may be added to your diet if you cannot get enough fiber from foods.   Drink enough fluids to keep your urine clear or pale yellow.   Exercise regularly or as directed by your caregiver.   Go to the restroom when you have the urge to go. Do not hold it.  Only take medicines as  directed by your caregiver. Do not take other medicines for constipation without talking to your caregiver first. SEEK IMMEDIATE MEDICAL CARE IF:   You have bright red blood in your stool.   Your constipation lasts for more than 4 days or gets worse.   You have abdominal or rectal pain.   You have thin, pencil-like stools.  You have unexplained weight loss. MAKE SURE YOU:   Understand these instructions.  Will watch your condition.  Will get help right away if you are not doing well or get worse. Document Released: 05/03/2004 Document Revised: 10/28/2011 Document Reviewed: 05/17/2013 ExitCare Patient Information 2014  [employer]. ° °

## 2013-11-26 ENCOUNTER — Encounter (HOSPITAL_COMMUNITY): Payer: Self-pay

## 2013-11-26 ENCOUNTER — Inpatient Hospital Stay (HOSPITAL_COMMUNITY)
Admission: AD | Admit: 2013-11-26 | Discharge: 2013-11-26 | Disposition: A | Discharge status: Home / Self Care | Payer: Medicaid Other | Source: Ambulatory Visit | Attending: Obstetrics and Gynecology | Admitting: Obstetrics and Gynecology

## 2013-11-26 DIAGNOSIS — M545 Low back pain, unspecified: Secondary | ICD-10-CM

## 2013-11-26 DIAGNOSIS — O368390 Maternal care for abnormalities of the fetal heart rate or rhythm, unspecified trimester, not applicable or unspecified: Secondary | ICD-10-CM | POA: Insufficient documentation

## 2013-11-26 DIAGNOSIS — M549 Dorsalgia, unspecified: Secondary | ICD-10-CM | POA: Insufficient documentation

## 2013-11-26 DIAGNOSIS — O4700 False labor before 37 completed weeks of gestation, unspecified trimester: Secondary | ICD-10-CM | POA: Insufficient documentation

## 2013-11-26 DIAGNOSIS — O479 False labor, unspecified: Secondary | ICD-10-CM

## 2013-11-26 LAB — URINALYSIS, ROUTINE W REFLEX MICROSCOPIC
Bilirubin Urine: NEGATIVE
GLUCOSE, UA: NEGATIVE mg/dL
HGB URINE DIPSTICK: NEGATIVE
Ketones, ur: NEGATIVE mg/dL
Leukocytes, UA: NEGATIVE
Nitrite: NEGATIVE
PH: 6 (ref 5.0–8.0)
PROTEIN: NEGATIVE mg/dL
Urobilinogen, UA: 0.2 mg/dL (ref 0.0–1.0)

## 2013-11-26 LAB — WET PREP, GENITAL
TRICH WET PREP: NONE SEEN
WBC WET PREP: NONE SEEN
YEAST WET PREP: NONE SEEN

## 2013-11-26 MED ORDER — ACETAMINOPHEN 500 MG PO TABS
1000.0000 mg | ORAL_TABLET | Freq: Once | ORAL | Status: AC
  Administered 2013-11-26: 1000 mg via ORAL
  Filled 2013-11-26: qty 2

## 2013-11-26 NOTE — Discharge Instructions (Signed)
Abdominal Pain During Pregnancy  Belly (abdominal) pain is common during pregnancy. Most of the time, it is not a serious problem. Other times, it can be a sign that something is wrong with the pregnancy. Always tell your doctor if you have belly pain.  HOME CARE  Monitor your belly pain for any changes. The following actions may help you feel better:  · Do not have sex (intercourse) or put anything in your vagina until you feel better.  · Rest until your pain stops.  · Drink clear fluids if you feel sick to your stomach (nauseous). Do not eat solid food until you feel better.  · Only take medicine as told by your doctor.  · Keep all doctor visits as told.  GET HELP RIGHT AWAY IF:   · You are bleeding, leaking fluid, or pieces of tissue come out of your vagina.  · You have more pain or cramping.  · You keep throwing up (vomiting).  · You have pain when you pee (urinate) or have blood in your pee.  · You have a fever.  · You do not feel your baby moving as much.  · You feel very weak or feel like passing out.  · You have trouble breathing, with or without belly pain.  · You have a very bad headache and belly pain.  · You have fluid leaking from your vagina and belly pain.  · You keep having watery poop (diarrhea).  · Your belly pain does not go away after resting, or the pain gets worse.  MAKE SURE YOU:   · Understand these instructions.  · Will watch your condition.  · Will get help right away if you are not doing well or get worse.  Document Released: 07/24/2009 Document Revised: 04/07/2013 Document Reviewed: 03/04/2013  ExitCare® Patient Information ©2014 [employer], LLC.

## 2013-11-26 NOTE — MAU Note (Signed)
I've been contracting for last 3 days and are getting worse. Started in my back but now is my back and stomach. They come and go. Denies bleeding. I don't feel any leaking but sometimes my panties are alittle wet.

## 2013-11-26 NOTE — MAU Provider Note (Signed)
History     CSN: [id number]  Arrival date and time: 11/26/13 2113   First Provider Initiated Contact with Patient 11/26/13 2225      Chief Complaint  Patient presents with  . Contractions   HPI  Ms. Maria Olson is a 23 y.o. female [phone] at [redacted]w[redacted]d who presents with contractions that started 3 days ago. The contractions are described as "at times every back to back, and some every so often". She reports good fetal movement, denies LOF, vaginal bleeding, vaginal itching/burning, urinary symptoms, h/a, dizziness, n/v, or fever/chills.   She currently rates her pain 5/10; lower stomach and back pain.   OB History   Grav Para Term Preterm Abortions TAB SAB Ect Mult Living   5 2 1 1 2 1  0 0 0 2      Past Medical History  Diagnosis Date  . No pertinent past medical history   . Medical history non-contributory   . Preterm labor     Past Surgical History  Procedure Laterality Date  . No past surgeries    . Cesarean section      Family History  Problem Relation Age of Onset  . Asthma Brother     History  Substance Use Topics  . Smoking status: Never Smoker   . Smokeless tobacco: Never Used  . Alcohol Use: No    Allergies: No Known Allergies  Prescriptions prior to admission  Medication Sig Dispense Refill  . acetaminophen (TYLENOL) 500 MG tablet Take 500 mg by mouth every 6 (six) hours as needed for mild pain.      . fexofenadine (ALLEGRA) 180 MG tablet Take 180 mg by mouth daily.      [name] (EYE ALLERGY RELIEF OP) Place 2 drops into both eyes 4 (four) times daily as needed (For allergies.).       Results for orders placed during the hospital encounter of 11/26/13 (from the past 48 hour(s))  URINALYSIS, ROUTINE W REFLEX MICROSCOPIC     Status: Abnormal   Collection Time    11/26/13  9:35 PM      Result Value Ref Range   Color, Urine YELLOW  YELLOW   APPearance CLEAR  CLEAR   Specific Gravity, Urine >1.030 (*) 1.005 - 1.030   pH 6.0  5.0  - 8.0   Glucose, UA NEGATIVE  NEGATIVE mg/dL   Hgb urine dipstick NEGATIVE  NEGATIVE   Bilirubin Urine NEGATIVE  NEGATIVE   Ketones, ur NEGATIVE  NEGATIVE mg/dL   Protein, ur NEGATIVE  NEGATIVE mg/dL   Urobilinogen, UA 0.2  0.0 - 1.0 mg/dL   Nitrite NEGATIVE  NEGATIVE   Leukocytes, UA NEGATIVE  NEGATIVE   Comment: MICROSCOPIC NOT DONE ON URINES WITH NEGATIVE PROTEIN, BLOOD, LEUKOCYTES, NITRITE, OR GLUCOSE <1000 mg/dL.  WET PREP, GENITAL     Status: Abnormal   Collection Time    11/26/13 10:31 PM      Result Value Ref Range   Yeast Wet Prep HPF POC NONE SEEN  NONE SEEN   Trich, Wet Prep NONE SEEN  NONE SEEN   Clue Cells Wet Prep HPF POC FEW (*) NONE SEEN   WBC, Wet Prep HPF POC NONE SEEN  NONE SEEN   Comment: MODERATE BACTERIA SEEN    Review of Systems  Constitutional: Negative for fever and chills.  Gastrointestinal: Positive for abdominal pain (+ contraction pain ). Negative for nausea, vomiting, diarrhea and constipation.  Genitourinary: Negative for dysuria, urgency, frequency and hematuria.  Physical Exam   Blood pressure 113/66, pulse 94, temperature 98.4 F (36.9 C), resp. rate 20, height 5\' 3"  (1.6 m), weight 97.523 kg (215 lb), last menstrual period 06/05/2013.  Physical Exam  Constitutional: She is oriented to person, place, and time. She appears well-developed and well-nourished.  Non-toxic appearance. She does not have a sickly appearance. She does not appear ill. No distress.  Eyes: Pupils are equal, round, and reactive to light.  Neck: Neck supple.  Respiratory: Effort normal.  GI: Soft. There is no tenderness.  Genitourinary:  Speculum exam: Vagina - Small amount of creamy discharge, no odor Cervix - No contact bleeding Bimanual exam: Cervix closed, thick, soft, posterior  Uterus non tender, Gravid  Adnexa non tender, no masses bilaterally GC/Chlam, wet prep done Chaperone present for exam.   Musculoskeletal: Normal range of motion.  Neurological: She  is alert and oriented to person, place, and time.  Skin: Skin is warm. She is not diaphoretic.  Psychiatric: Her behavior is normal.   Fetal Tracing: Baseline: 150 bpm  Variability: Moderate  Accelerations: 15x15 Decelerations: Variable decelerations  Toco: None  MAU Course  Procedures None  MDM NST Wet prep GC Tylenol 1 gram  Consulted with Dr. Claiborne Billingsallahan who agrees with plan of care.   Assessment and Plan   A:  Braxton hicks contractions  Back pain in pregnancy  P:  Discharge home in stable condition Preterm labor precautions given Maternity belt recommended  Ok to take tylenol as needed, as directed on the bottle   Iona HansenJennifer Irene Rasch, NP  11/26/2013, 11:24 PM

## 2013-11-27 LAB — GC/CHLAMYDIA PROBE AMP
CT PROBE, AMP APTIMA: NEGATIVE
GC Probe RNA: NEGATIVE

## 2014-02-01 ENCOUNTER — Inpatient Hospital Stay (HOSPITAL_COMMUNITY)
Admission: AD | Admit: 2014-02-01 | Discharge: 2014-02-02 | Disposition: A | Discharge status: Home / Self Care | Payer: Medicaid Other | Source: Ambulatory Visit | Attending: Obstetrics and Gynecology | Admitting: Obstetrics and Gynecology

## 2014-02-01 ENCOUNTER — Encounter (HOSPITAL_COMMUNITY): Payer: Self-pay | Admitting: *Deleted

## 2014-02-01 DIAGNOSIS — O212 Late vomiting of pregnancy: Secondary | ICD-10-CM | POA: Insufficient documentation

## 2014-02-01 DIAGNOSIS — O4700 False labor before 37 completed weeks of gestation, unspecified trimester: Secondary | ICD-10-CM | POA: Insufficient documentation

## 2014-02-01 DIAGNOSIS — O4703 False labor before 37 completed weeks of gestation, third trimester: Secondary | ICD-10-CM

## 2014-02-01 DIAGNOSIS — O34219 Maternal care for unspecified type scar from previous cesarean delivery: Secondary | ICD-10-CM | POA: Insufficient documentation

## 2014-02-01 NOTE — MAU Note (Addendum)
PT  SAYS HER ENTIRE ABD HURTS-  STARTED TODAY.-  COMES/ GOES.     NO VOMITING-  BUT NAUSEA  X3  DAYS -  CALLED DR Dareen PianoANDERSON-  TODAY AND YESTERDAY-  TOLD TO DRINK WATER-   IF NOT BETTER  TO COME  TO HOSPITAL.    DIARRHEA - STARTED  THIS AM-  LOOSE-  NOT WATERY   X1 TODAY .   TOOK PHENERGAN  X3 DAYS-  STARTED ON SAT-- NOT HELP. Marland Kitchen.     NOONE ELSE IN HOUSE SICK.    SAYS  VOIDED AT HOME-  CANNOT  GIVE SPECIMEN  NOW.

## 2014-02-02 DIAGNOSIS — O4700 False labor before 37 completed weeks of gestation, unspecified trimester: Secondary | ICD-10-CM

## 2014-02-02 LAB — URINALYSIS, ROUTINE W REFLEX MICROSCOPIC
BILIRUBIN URINE: NEGATIVE
Glucose, UA: NEGATIVE mg/dL
Hgb urine dipstick: NEGATIVE
Ketones, ur: NEGATIVE mg/dL
Leukocytes, UA: NEGATIVE
Nitrite: NEGATIVE
PROTEIN: NEGATIVE mg/dL
Specific Gravity, Urine: 1.025 (ref 1.005–1.030)
Urobilinogen, UA: 1 mg/dL (ref 0.0–1.0)
pH: 6 (ref 5.0–8.0)

## 2014-02-02 LAB — POCT FERN TEST: POCT Fern Test: NEGATIVE

## 2014-02-02 MED ORDER — ONDANSETRON HCL 4 MG PO TABS: 4.0000 mg | ORAL_TABLET | Freq: Every day | ORAL | Status: DC | PRN

## 2014-02-02 NOTE — MAU Provider Note (Signed)
Chief Complaint:  No chief complaint on file.   First Tay Whitwell Initiated Contact with Patient 02/01/14 2349      HPI: Maria Olson is a 23 y.o. [id number] at 2761w4d who presents to maternity admissions reporting preterm contractions, nausea and vomiting x3 days, one loose bowel movement today and possible leaking of fluid versus leaking urine this evening. No improvement in nausea with Phenergan. Uncertain of frequency of contractions or vomiting.   Denies fever, chills, or vaginal bleeding. Good fetal movement.   Pregnancy Course: Uncomplicated. History of C-section for gastroschisis 2 years ago.  Past Medical History: Past Medical History  Diagnosis Date  . No pertinent past medical history   . Preterm labor     Past obstetric history: OB History  Gravida Para Term Preterm AB SAB TAB Ectopic Multiple Living  5 2 1 1 2  0 1 0 0 2    # Outcome Date GA Lbr Len/2nd Weight Sex Delivery Anes PTL Lv  5 CUR           4 PRE 2013 3259w0d   M LTCS  Y Y     Comments: gastroschisis  3 TRM 04/23/11 571w2d 10:26 / 01:14 3.255 kg (7 lb 2.8 oz) F SVD EPI  Y     Comments: None  2 ABT           1 TAB               Past Surgical History: Past Surgical History  Procedure Laterality Date  . Cesarean section       Family History: Family History  Problem Relation Age of Onset  . Asthma Brother     Social History: History  Substance Use Topics  . Smoking status: Never Smoker   . Smokeless tobacco: Never Used  . Alcohol Use: No    Allergies: No Known Allergies  Meds:  Prescriptions prior to admission  Medication Sig Dispense Refill  . acetaminophen (TYLENOL) 500 MG tablet Take 500 mg by mouth every 6 (six) hours as needed for mild pain.      . Prenatal Vit-Fe Fumarate-FA (PRENATAL MULTIVITAMIN) TABS tablet Take 1 tablet by mouth daily at 12 noon.      . promethazine (PHENERGAN) 25 MG tablet Take 25 mg by mouth every 6 (six) hours as needed for nausea or vomiting.        ROS:  Pertinent findings in history of present illness.  Physical Exam  Blood pressure 119/62, pulse 108, temperature 98.9 F (37.2 C), temperature source Oral, resp. rate 20, height 5\' 2"  (1.575 m), weight 102.116 kg (225 lb 2 oz), last menstrual period 06/05/2013. GENERAL: Well-developed, well-nourished female in no acute distress.  HEENT: normocephalic HEART: normal rate RESP: normal effort ABDOMEN: Soft, non-tender, gravid appropriate for gestational age EXTREMITIES: Nontender, no edema NEURO: alert and oriented SPECULUM EXAM: NEFG, scant physiologic discharge, neg pool, no blood, cervix clean Dilation: Fingertip Effacement (%): Thick Cervical Position: Posterior Station: Ballotable Exam by:: Maria Olson CNM  FHT:  Baseline 140 , moderate variability, accelerations present, no decelerations Contractions: UI   Labs: Results for orders placed during the hospital encounter of 02/01/14 (from the past 24 hour(s))  POCT FERN TEST     Status: None   Collection Time    02/02/14 12:19 AM      Result Value Ref Range   POCT Fern Test Negative = intact amniotic membranes    URINALYSIS, ROUTINE W REFLEX MICROSCOPIC     Status: None  Collection Time    02/02/14 12:25 AM      Result Value Ref Range   Color, Urine YELLOW  YELLOW   APPearance CLEAR  CLEAR   Specific Gravity, Urine 1.025  1.005 - 1.030   pH 6.0  5.0 - 8.0   Glucose, UA NEGATIVE  NEGATIVE mg/dL   Hgb urine dipstick NEGATIVE  NEGATIVE   Bilirubin Urine NEGATIVE  NEGATIVE   Ketones, ur NEGATIVE  NEGATIVE mg/dL   Protein, ur NEGATIVE  NEGATIVE mg/dL   Urobilinogen, UA 1.0  0.0 - 1.0 mg/dL   Nitrite NEGATIVE  NEGATIVE   Leukocytes, UA NEGATIVE  NEGATIVE    Imaging:  No results found.  MAU Course: Fern, UA, Push fluids.  Patient reports decreased contractions since arrival to maternity admissions. Only rare uterine irritability traced despite moving toco multiple times.  Assessment: 1. Preterm uterine contractions  in third trimester, antepartum    Plan: Discharge home per consult with Dr. Claiborne Billingsallahan. Preterm labor precautions and fetal kick counts. Increase fluids and rest. Small, frequent, bland meals. Follow-up Information   Follow [facility] with [facility] (As scheduled or , As needed if symptoms worsen)    Contact information:   [address] [place] [id number] [phone]      [facility] (As needed in emergencies)    Contact information:   [address] [id number] [place] [id number] [phone]        Medication List         acetaminophen 500 MG tablet  Commonly known as:  TYLENOL  Take 500 mg by mouth every 6 (six) hours as needed for mild pain.     ondansetron 4 MG tablet  Commonly known as:  ZOFRAN  Take 1 tablet (4 mg total) by mouth daily as needed for nausea or vomiting.     prenatal multivitamin Tabs tablet  Take 1 tablet by mouth daily at 12 noon.     promethazine 25 MG tablet  Commonly known as:  PHENERGAN  Take 25 mg by mouth every 6 (six) hours as needed for nausea or vomiting.       [place] Olson, CNM 02/02/2014 12:55 AM

## 2014-02-02 NOTE — Discharge Instructions (Signed)
Braxton Hicks Contractions °Contractions of the uterus can occur throughout pregnancy. Contractions are not always a sign that you are in labor.  °WHAT ARE BRAXTON HICKS CONTRACTIONS?  °Contractions that occur before labor are called Braxton Hicks contractions, or false labor. Toward the end of pregnancy (32-34 weeks), these contractions can develop more often and may become more forceful. This is not true labor because these contractions do not result in opening (dilatation) and thinning of the cervix. They are sometimes difficult to tell apart from true labor because these contractions can be forceful and people have different pain tolerances. You should not feel embarrassed if you go to the hospital with false labor. Sometimes, the only way to tell if you are in true labor is for your health care provider to look for changes in the cervix. °If there are no prenatal problems or other health problems associated with the pregnancy, it is completely safe to be sent home with false labor and await the onset of true labor. °HOW CAN YOU TELL THE DIFFERENCE BETWEEN TRUE AND FALSE LABOR? °False Labor °· The contractions of false labor are usually shorter and not as hard as those of true labor.   °· The contractions are usually irregular.   °· The contractions are often felt in the front of the lower abdomen and in the groin.   °· The contractions may go away when you walk around or change positions while lying down.   °· The contractions get weaker and are shorter lasting as time goes on.   °· The contractions do not usually become progressively stronger, regular, and closer together as with true labor.   °True Labor °· Contractions in true labor last 30-70 seconds, become very regular, usually become more intense, and increase in frequency.   °· The contractions do not go away with walking.   °· The discomfort is usually felt in the top of the uterus and spreads to the lower abdomen and low back.   °· True labor can be  determined by your health care provider with an exam. This will show that the cervix is dilating and getting thinner.   °WHAT TO REMEMBER °· Keep up with your usual exercises and follow other instructions given by your health care provider.   °· Take medicines as directed by your health care provider.   °· Keep your regular prenatal appointments.   °· Eat and drink lightly if you think you are going into labor.   °· If Braxton Hicks contractions are making you uncomfortable:   °¨ Change your position from lying down or resting to walking, or from walking to resting.   °¨ Sit and rest in a tub of warm water.   °¨ Drink 2-3 glasses of water. Dehydration may cause these contractions.   °¨ Do slow and deep breathing several times an hour.   °WHEN SHOULD I SEEK IMMEDIATE MEDICAL CARE? °Seek immediate medical care if: °· Your contractions become stronger, more regular, and closer together.   °· You have fluid leaking or gushing from your vagina.   °· You have a fever.   °· You pass blood-tinged mucus.   °· You have vaginal bleeding.   °· You have continuous abdominal pain.   °· You have low back pain that you never had before.   °· You feel your baby's head pushing down and causing pelvic pressure.   °· Your baby is not moving as much as it used to.   °Document Released: 08/05/2005 Document Revised: 08/10/2013 Document Reviewed: 05/17/2013 °ExitCare® Patient Information ©2015 [employer]. This information is not intended to replace advice given to you by your health care   provider. Make sure you discuss any questions you have with your health care provider.   Morning Sickness Morning sickness is when you feel sick to your stomach (nauseous) during pregnancy. This nauseous feeling may or may not come with vomiting. It often occurs in the morning but can be a problem any time of day. Morning sickness is most common during the first trimester, but it may continue throughout pregnancy. While morning sickness is  unpleasant, it is usually harmless unless you develop severe and continual vomiting (hyperemesis gravidarum). This condition requires more intense treatment.  CAUSES  The cause of morning sickness is not completely known but seems to be related to normal hormonal changes that occur in pregnancy. RISK FACTORS You are at greater risk if you:  Experienced nausea or vomiting before your pregnancy.  Had morning sickness during a previous pregnancy.  Are pregnant with more than one baby, such as twins. TREATMENT  Do not use any medicines (prescription, over-the-counter, or herbal) for morning sickness without first talking to your health care provider. Your health care provider may prescribe or recommend:  Vitamin B6 supplements.  Anti-nausea medicines.  The herbal medicine ginger. HOME CARE INSTRUCTIONS   Only take over-the-counter or prescription medicines as directed by your health care provider.  Taking multivitamins before getting pregnant can prevent or decrease the severity of morning sickness in most women.   Eat a piece of dry toast or unsalted crackers before getting out of bed in the morning.   Eat five or six small meals a day.   Eat dry and bland foods (rice, baked potato). Foods high in carbohydrates are often helpful.  Do not drink liquids with your meals. Drink liquids between meals.   Avoid greasy, fatty, and spicy foods.   Get someone to cook for you if the smell of any food causes nausea and vomiting.   If you feel nauseous after taking prenatal vitamins, take the vitamins at night or with a snack.  Snack on protein foods (nuts, yogurt, cheese) between meals if you are hungry.   Eat unsweetened gelatins for desserts.   Wearing an acupressure wristband (worn for sea sickness) may be helpful.   Acupuncture may be helpful.   Do not smoke.   Get a humidifier to keep the air in your house free of odors.   Get plenty of fresh air. SEEK MEDICAL  CARE IF:   Your home remedies are not working, and you need medicine.  You feel dizzy or lightheaded.  You are losing weight. SEEK IMMEDIATE MEDICAL CARE IF:   You have persistent and uncontrolled nausea and vomiting.  You pass out (faint). Document Released: 09/26/2006 Document Revised: 04/07/2013 Document Reviewed: 01/20/2013 [facility] Patient Information 2014 [place] [place].  Fetal Movement Counts Patient Name: __________________________________________________ Patient Due Date: ____________________ Performing a fetal movement count is highly recommended in high-risk pregnancies, but it is good for every pregnant woman to do. Your caregiver may ask you to start counting fetal movements at 28 weeks of the pregnancy. Fetal movements often increase:  After eating a full meal.  After physical activity.  After eating or drinking something sweet or cold.  At rest. Pay attention to when you feel the baby is most active. This will help you notice a pattern of your baby's sleep and wake cycles and what factors contribute to an increase in fetal movement. It is important to perform a fetal movement count at the same time each day when your baby is normally most active.  HOW TO COUNT FETAL MOVEMENTS 1. Find a quiet and comfortable area to sit or lie down on your left side. Lying on your left side provides the best blood and oxygen circulation to your baby. 2. Write down the day and time on a sheet of paper or in a journal. 3. Start counting kicks, flutters, swishes, rolls, or jabs in a 2 hour period. You should feel at least 10 movements within 2 hours. 4. If you do not feel 10 movements in 2 hours, wait 2 3 hours and count again. Look for a change in the pattern or not enough counts in 2 hours. SEEK MEDICAL CARE IF:  You feel less than 10 counts in 2 hours, tried twice.  There is no movement in over an hour.  The pattern is changing or taking longer each day to reach 10 counts in 2  hours.  You feel the baby is not moving as he or she usually does. Date: ____________ Movements: ____________ Start time: ____________ Maria Olson time: ____________  Date: ____________ Movements: ____________ Start time: ____________ Maria Olson time: ____________ Date: ____________ Movements: ____________ Start time: ____________ Maria Olson time: ____________ Date: ____________ Movements: ____________ Start time: ____________ Maria Olson time: ____________ Date: ____________ Movements: ____________ Start time: ____________ Maria Olson time: ____________ Date: ____________ Movements: ____________ Start time: ____________ Maria Olson time: ____________ Date: ____________ Movements: ____________ Start time: ____________ Maria Olson time: ____________ Date: ____________ Movements: ____________ Start time: ____________ Maria Olson time: ____________  Date: ____________ Movements: ____________ Start time: ____________ Maria Olson time: ____________ Date: ____________ Movements: ____________ Start time: ____________ Maria Olson time: ____________ Date: ____________ Movements: ____________ Start time: ____________ Maria Olson time: ____________ Date: ____________ Movements: ____________ Start time: ____________ Maria Olson time: ____________ Date: ____________ Movements: ____________ Start time: ____________ Maria Olson time: ____________ Date: ____________ Movements: ____________ Start time: ____________ Maria Olson time: ____________ Date: ____________ Movements: ____________ Start time: ____________ Maria Olson time: ____________  Date: ____________ Movements: ____________ Start time: ____________ Maria Olson time: ____________ Date: ____________ Movements: ____________ Start time: ____________ Maria Olson time: ____________ Date: ____________ Movements: ____________ Start time: ____________ Maria Olson time: ____________ Date: ____________ Movements: ____________ Start time: ____________ Maria Olson time: ____________ Date: ____________ Movements: ____________ Start time: ____________  Maria Olson time: ____________ Date: ____________ Movements: ____________ Start time: ____________ Maria Olson time: ____________ Date: ____________ Movements: ____________ Start time: ____________ Maria Olson time: ____________  Date: ____________ Movements: ____________ Start time: ____________ Maria Olson time: ____________ Date: ____________ Movements: ____________ Start time: ____________ Maria Olson time: ____________ Date: ____________ Movements: ____________ Start time: ____________ Maria Olson time: ____________ Date: ____________ Movements: ____________ Start time: ____________ Maria Olson time: ____________ Date: ____________ Movements: ____________ Start time: ____________ Maria Olson time: ____________ Date: ____________ Movements: ____________ Start time: ____________ Maria Olson time: ____________ Date: ____________ Movements: ____________ Start time: ____________ Maria Olson time: ____________  Date: ____________ Movements: ____________ Start time: ____________ Maria Olson time: ____________ Date: ____________ Movements: ____________ Start time: ____________ Maria Olson time: ____________ Date: ____________ Movements: ____________ Start time: ____________ Maria Olson time: ____________ Date: ____________ Movements: ____________ Start time: ____________ Maria Olson time: ____________ Date: ____________ Movements: ____________ Start time: ____________ Maria Olson time: ____________ Date: ____________ Movements: ____________ Start time: ____________ Maria Olson time: ____________ Date: ____________ Movements: ____________ Start time: ____________ Maria Olson time: ____________  Date: ____________ Movements: ____________ Start time: ____________ Maria Olson time: ____________ Date: ____________ Movements: ____________ Start time: ____________ Maria Olson time: ____________ Date: ____________ Movements: ____________ Start time: ____________ Maria Olson time: ____________ Date: ____________ Movements: ____________ Start time: ____________ Maria Olson time: ____________ Date: ____________  Movements: ____________ Start time: ____________ Maria Olson time: ____________ Date: ____________ Movements: ____________ Start time: ____________ Maria Olson time: ____________ Date: ____________ Movements: ____________ Start time: ____________  Finish time: ____________  Date: ____________ Movements: ____________ Start time: ____________ Maria MartinFinish time: ____________ Date: ____________ Movements: ____________ Start time: ____________ Maria MartinFinish time: ____________ Date: ____________ Movements: ____________ Start time: ____________ Maria MartinFinish time: ____________ Date: ____________ Movements: ____________ Start time: ____________ Maria MartinFinish time: ____________ Date: ____________ Movements: ____________ Start time: ____________ Maria MartinFinish time: ____________ Date: ____________ Movements: ____________ Start time: ____________ Maria MartinFinish time: ____________ Date: ____________ Movements: ____________ Start time: ____________ Maria MartinFinish time: ____________  Date: ____________ Movements: ____________ Start time: ____________ Maria MartinFinish time: ____________ Date: ____________ Movements: ____________ Start time: ____________ Maria MartinFinish time: ____________ Date: ____________ Movements: ____________ Start time: ____________ Maria MartinFinish time: ____________ Date: ____________ Movements: ____________ Start time: ____________ Maria MartinFinish time: ____________ Date: ____________ Movements: ____________ Start time: ____________ Maria MartinFinish time: ____________ Date: ____________ Movements: ____________ Start time: ____________ Maria MartinFinish time: ____________ Document Released: 09/04/2006 Document Revised: 07/22/2012 Document Reviewed: 06/01/2012 ExitCare Patient Information 2014 [employer], [employer].

## 2014-02-11 ENCOUNTER — Other Ambulatory Visit: Payer: Self-pay | Admitting: Obstetrics and Gynecology

## 2014-02-11 LAB — OB RESULTS CONSOLE GBS: GBS: NEGATIVE

## 2014-02-27 ENCOUNTER — Encounter (HOSPITAL_COMMUNITY): Payer: Self-pay | Admitting: *Deleted

## 2014-02-27 ENCOUNTER — Inpatient Hospital Stay (HOSPITAL_COMMUNITY)
Admission: AD | Admit: 2014-02-27 | Discharge: 2014-02-27 | Disposition: A | Discharge status: Home / Self Care | Payer: Medicaid Other | Source: Ambulatory Visit | Attending: Obstetrics & Gynecology | Admitting: Obstetrics & Gynecology

## 2014-02-27 DIAGNOSIS — O479 False labor, unspecified: Secondary | ICD-10-CM | POA: Diagnosis present

## 2014-02-27 NOTE — MAU Note (Signed)
Assumed care of patient.

## 2014-02-27 NOTE — MAU Note (Addendum)
contractions started at 0300, have calmed down, but came because she is seeing blood. Started as pink this morning. Then came clear with blood streaks. No water leaking.  ? 1 cm on Friday when checked.  Denies any problems with preg, no placental issues or previa

## 2014-02-27 NOTE — Discharge Instructions (Signed)

## 2014-03-01 ENCOUNTER — Encounter (HOSPITAL_COMMUNITY): Payer: Self-pay | Admitting: *Deleted

## 2014-03-01 ENCOUNTER — Encounter (HOSPITAL_COMMUNITY): Payer: Medicaid Other | Admitting: Anesthesiology

## 2014-03-01 ENCOUNTER — Inpatient Hospital Stay (HOSPITAL_COMMUNITY)
Admission: AD | Admit: 2014-03-01 | Discharge: 2014-03-03 | DRG: 775 | Disposition: A | Discharge status: Home / Self Care | Payer: Medicaid Other | Source: Ambulatory Visit | Attending: Obstetrics and Gynecology | Admitting: Obstetrics and Gynecology

## 2014-03-01 ENCOUNTER — Inpatient Hospital Stay (HOSPITAL_COMMUNITY): Payer: Medicaid Other | Admitting: Anesthesiology

## 2014-03-01 DIAGNOSIS — IMO0001 Reserved for inherently not codable concepts without codable children: Secondary | ICD-10-CM

## 2014-03-01 DIAGNOSIS — O479 False labor, unspecified: Secondary | ICD-10-CM | POA: Diagnosis present

## 2014-03-01 LAB — CBC
HCT: 32.2 % — ABNORMAL LOW (ref 36.0–46.0)
Hemoglobin: 11 g/dL — ABNORMAL LOW (ref 12.0–15.0)
MCH: 30.5 pg (ref 26.0–34.0)
MCHC: 34.2 g/dL (ref 30.0–36.0)
MCV: 89.2 fL (ref 78.0–100.0)
Platelets: 266 10*3/uL (ref 150–400)
RBC: 3.61 MIL/uL — ABNORMAL LOW (ref 3.87–5.11)
RDW: 13.1 % (ref 11.5–15.5)
WBC: 5.6 10*3/uL (ref 4.0–10.5)

## 2014-03-01 LAB — TYPE AND SCREEN
ABO/RH(D): O POS
ANTIBODY SCREEN: NEGATIVE

## 2014-03-01 LAB — RPR

## 2014-03-01 MED ORDER — LACTATED RINGERS IV SOLN: 500.0000 mL | INTRAVENOUS | Status: DC | PRN

## 2014-03-01 MED ORDER — OXYTOCIN 40 UNITS IN LACTATED RINGERS INFUSION - SIMPLE MED
62.5000 mL/h | INTRAVENOUS | Status: DC
Start: 2014-03-01 — End: 2014-03-02

## 2014-03-01 MED ORDER — ACETAMINOPHEN 325 MG PO TABS: 650.0000 mg | ORAL_TABLET | ORAL | Status: DC | PRN

## 2014-03-01 MED ORDER — DIPHENHYDRAMINE HCL 50 MG/ML IJ SOLN
12.5000 mg | INTRAMUSCULAR | Status: DC | PRN
  Administered 2014-03-01 (×2): 12.5 mg via INTRAVENOUS
  Filled 2014-03-01: qty 1

## 2014-03-01 MED ORDER — TERBUTALINE SULFATE 1 MG/ML IJ SOLN: 0.2500 mg | Freq: Once | INTRAMUSCULAR | Status: AC | PRN

## 2014-03-01 MED ORDER — IBUPROFEN 600 MG PO TABS: 600.0000 mg | ORAL_TABLET | Freq: Four times a day (QID) | ORAL | Status: DC | PRN

## 2014-03-01 MED ORDER — CITRIC ACID-SODIUM CITRATE 334-500 MG/5ML PO SOLN: 30.0000 mL | ORAL | Status: DC | PRN

## 2014-03-01 MED ORDER — OXYTOCIN BOLUS FROM INFUSION
500.0000 mL | INTRAVENOUS | Status: DC
  Administered 2014-03-02: 500 mL via INTRAVENOUS

## 2014-03-01 MED ORDER — EPHEDRINE 5 MG/ML INJ
10.0000 mg | INTRAVENOUS | Status: DC | PRN
  Filled 2014-03-01: qty 2

## 2014-03-01 MED ORDER — PHENYLEPHRINE 40 MCG/ML (10ML) SYRINGE FOR IV PUSH (FOR BLOOD PRESSURE SUPPORT)
80.0000 ug | PREFILLED_SYRINGE | INTRAVENOUS | Status: DC | PRN
  Filled 2014-03-01: qty 2

## 2014-03-01 MED ORDER — FLEET ENEMA 7-19 GM/118ML RE ENEM: 1.0000 | ENEMA | RECTAL | Status: DC | PRN

## 2014-03-01 MED ORDER — PHENYLEPHRINE 40 MCG/ML (10ML) SYRINGE FOR IV PUSH (FOR BLOOD PRESSURE SUPPORT)
80.0000 ug | PREFILLED_SYRINGE | INTRAVENOUS | Status: DC | PRN
  Filled 2014-03-01: qty 2
  Filled 2014-03-01: qty 10

## 2014-03-01 MED ORDER — LIDOCAINE HCL (PF) 1 % IJ SOLN
INTRAMUSCULAR | Status: DC | PRN
  Administered 2014-03-01 (×4): 4 mL

## 2014-03-01 MED ORDER — ONDANSETRON HCL 4 MG/2ML IJ SOLN: 4.0000 mg | Freq: Four times a day (QID) | INTRAMUSCULAR | Status: DC | PRN

## 2014-03-01 MED ORDER — OXYCODONE-ACETAMINOPHEN 5-325 MG PO TABS: 1.0000 | ORAL_TABLET | ORAL | Status: DC | PRN

## 2014-03-01 MED ORDER — LACTATED RINGERS IV SOLN
INTRAVENOUS | Status: DC
  Administered 2014-03-01 (×2): via INTRAVENOUS

## 2014-03-01 MED ORDER — OXYTOCIN 40 UNITS IN LACTATED RINGERS INFUSION - SIMPLE MED
1.0000 m[IU]/min | INTRAVENOUS | Status: DC
  Administered 2014-03-01: 1 m[IU]/min via INTRAVENOUS
  Filled 2014-03-01: qty 1000

## 2014-03-01 MED ORDER — FENTANYL 2.5 MCG/ML BUPIVACAINE 1/10 % EPIDURAL INFUSION (WH - ANES)
14.0000 mL/h | INTRAMUSCULAR | Status: DC | PRN
  Administered 2014-03-01 – 2014-03-02 (×2): 14 mL/h via EPIDURAL
  Filled 2014-03-01 (×2): qty 125

## 2014-03-01 MED ORDER — LIDOCAINE HCL (PF) 1 % IJ SOLN
30.0000 mL | INTRAMUSCULAR | Status: DC | PRN
  Filled 2014-03-01: qty 30

## 2014-03-01 MED ORDER — LACTATED RINGERS IV SOLN: 500.0000 mL | Freq: Once | INTRAVENOUS | Status: DC

## 2014-03-01 NOTE — Anesthesia Procedure Notes (Signed)
Epidural Patient location during procedure: OB Start time: 03/01/2014 7:36 PM  Staffing Anesthesiologist: Jocob Dambach Performed by: anesthesiologist   Preanesthetic Checklist Completed: patient identified, site marked, surgical consent, pre-op evaluation, timeout performed, IV checked, risks and benefits discussed and monitors and equipment checked  Epidural Patient position: sitting Prep: site prepped and draped and DuraPrep Patient monitoring: continuous pulse ox and blood pressure Approach: midline Location: L3-L4 Injection technique: LOR air  Needle:  Needle type: Tuohy  Needle gauge: 17 G Needle length: 9 cm and 9 Needle insertion depth: 7 cm Catheter type: closed end flexible Catheter size: 19 Gauge Catheter at skin depth: 12 cm Test dose: negative  Assessment Events: blood not aspirated, injection not painful, no injection resistance, negative IV test and no paresthesia  Additional Notes Discussed risk of headache, infection, bleeding, nerve injury and failed or incomplete block.  Patient voices understanding and wishes to proceed.  Epidural placed easily on first attempt.  No paresthesia.  Patient tolerated procedure well with no apparent complications.  Jasmine DecemberA. Ebrahim Deremer, MDReason for block:procedure for pain

## 2014-03-01 NOTE — MAU Note (Signed)
uc's since Sunday, more intense today, bloody show.  Feels like possibly leaking when she has uc's.  Vomiting today, diarrhea this a.m.

## 2014-03-01 NOTE — Anesthesia Preprocedure Evaluation (Signed)
Anesthesia Evaluation  Patient identified by MRN, date of birth, ID band Patient awake    Reviewed: Allergy & Precautions, H&P , NPO status , Patient's Chart, lab work & pertinent test results, reviewed documented beta blocker date and time   History of Anesthesia Complications Negative for: history of anesthetic complications  Airway Mallampati: III TM Distance: >3 FB Neck ROM: full    Dental  (+) Teeth Intact   Pulmonary neg pulmonary ROS,  breath sounds clear to auscultation        Cardiovascular negative cardio ROS  Rhythm:regular Rate:Normal     Neuro/Psych negative neurological ROS  negative psych ROS   GI/Hepatic negative GI ROS, Neg liver ROS,   Endo/Other  BMI 39.6  Renal/GU negative Renal ROS     Musculoskeletal   Abdominal   Peds  Hematology negative hematology ROS (+)   Anesthesia Other Findings   Reproductive/Obstetrics (+) Pregnancy (h/o c/s x1, attempting VBAC)                           Anesthesia Physical Anesthesia Plan  ASA: II  Anesthesia Plan: Epidural   Post-op Pain Management:    Induction:   Airway Management Planned:   Additional Equipment:   Intra-op Plan:   Post-operative Plan:   Informed Consent: I have reviewed the patients History and Physical, chart, labs and discussed the procedure including the risks, benefits and alternatives for the proposed anesthesia with the patient or authorized representative who has indicated his/her understanding and acceptance.     Plan Discussed with:   Anesthesia Plan Comments:         Anesthesia Quick Evaluation

## 2014-03-01 NOTE — H&P (Signed)
23 y.o. 4677w3d  [id number] comes in c/o labor.  Otherwise has good fetal movement and no bleeding.  Past Medical History  Diagnosis Date  . No pertinent past medical history   . Preterm labor     Past Surgical History  Procedure Laterality Date  . Cesarean section      OB History  Gravida Para Term Preterm AB SAB TAB Ectopic Multiple Living  5 2 1 1 2  0 2 0 0 2    # Outcome Date GA Lbr Len/2nd Weight Sex Delivery Anes PTL Lv  5 CUR           4 TAB 2014          3 TAB 2014          2 PRE 03/27/12 382w0d   M LTCS Spinal Y Y     Comments: gastroschisis  1 TRM 04/23/11 1127w2d 10:26 / 01:14 3.255 kg (7 lb 2.8 oz) F SVD EPI  Y     Comments: None      History   Social History  . Marital Status: Single    Spouse Name: N/A    Number of Children: N/A  . Years of Education: N/A   Occupational History  . Not on file.   Social History Main Topics  . Smoking status: Never Smoker   . Smokeless tobacco: Never Used  . Alcohol Use: No  . Drug Use: No  . Sexual Activity: Yes    Birth Control/ Protection: None   Other Topics Concern  . Not on file   Social History Narrative  . No narrative on file   Review of patient's allergies indicates no known allergies.    Prenatal Transfer Tool  Maternal Diabetes: No Genetic Screening: Normal Maternal Ultrasounds/Referrals: Normal Fetal Ultrasounds or other Referrals:  None Maternal Substance Abuse:  No Significant Maternal Medications:  None Significant Maternal Lab Results: None  Other PNC: uncomplicated.    Filed Vitals:   03/01/14 1850  BP: 112/58  Pulse: 103  Temp:      Lungs/Cor:  NAD Abdomen:  soft, gravid Ex:  no cords, erythema SVE:  5/80/-2, intact per MAU FHTs:  120, good STV, NST R Toco:  q3-5   A/P   TOLAC - hx of LTCS for gastroschesis at 33 weeks.  Pt had previous normal vaginal delivery.  GBS neg.  D/C summ at office confirms LTCS at 33 weeks.  Alexei Doswell A

## 2014-03-01 NOTE — MAU Note (Signed)
Pt states she has been having contractions since Sunday morning that "calmed down" Pt states she is also complaining of nausea and vomiting, and vaginal bleeding

## 2014-03-02 ENCOUNTER — Encounter (HOSPITAL_COMMUNITY): Payer: Self-pay | Admitting: *Deleted

## 2014-03-02 LAB — CBC
HEMATOCRIT: 30.6 % — AB (ref 36.0–46.0)
Hemoglobin: 10.4 g/dL — ABNORMAL LOW (ref 12.0–15.0)
MCH: 30.1 pg (ref 26.0–34.0)
MCHC: 34 g/dL (ref 30.0–36.0)
MCV: 88.4 fL (ref 78.0–100.0)
Platelets: 236 10*3/uL (ref 150–400)
RBC: 3.46 MIL/uL — AB (ref 3.87–5.11)
RDW: 13.1 % (ref 11.5–15.5)
WBC: 10.4 10*3/uL (ref 4.0–10.5)

## 2014-03-02 MED ORDER — SODIUM CHLORIDE 0.9 % IJ SOLN: 3.0000 mL | Freq: Two times a day (BID) | INTRAMUSCULAR | Status: DC

## 2014-03-02 MED ORDER — PRENATAL MULTIVITAMIN CH: 1.0000 | ORAL_TABLET | Freq: Every day | ORAL | Status: DC

## 2014-03-02 MED ORDER — OXYCODONE-ACETAMINOPHEN 5-325 MG PO TABS
1.0000 | ORAL_TABLET | ORAL | Status: DC | PRN
  Administered 2014-03-02: 1 via ORAL
  Administered 2014-03-02: 2 via ORAL
  Filled 2014-03-02: qty 1
  Filled 2014-03-02: qty 2

## 2014-03-02 MED ORDER — SODIUM CHLORIDE 0.9 % IV SOLN: 250.0000 mL | INTRAVENOUS | Status: DC | PRN

## 2014-03-02 MED ORDER — WITCH HAZEL-GLYCERIN EX PADS: 1.0000 "application " | MEDICATED_PAD | CUTANEOUS | Status: DC | PRN

## 2014-03-02 MED ORDER — DIPHENHYDRAMINE HCL 25 MG PO CAPS: 25.0000 mg | ORAL_CAPSULE | Freq: Four times a day (QID) | ORAL | Status: DC | PRN

## 2014-03-02 MED ORDER — BENZOCAINE-MENTHOL 20-0.5 % EX AERO
1.0000 "application " | INHALATION_SPRAY | CUTANEOUS | Status: DC | PRN
  Filled 2014-03-02: qty 56

## 2014-03-02 MED ORDER — ONDANSETRON HCL 4 MG/2ML IJ SOLN: 4.0000 mg | INTRAMUSCULAR | Status: DC | PRN

## 2014-03-02 MED ORDER — ONDANSETRON HCL 4 MG PO TABS: 4.0000 mg | ORAL_TABLET | ORAL | Status: DC | PRN

## 2014-03-02 MED ORDER — SENNOSIDES-DOCUSATE SODIUM 8.6-50 MG PO TABS
2.0000 | ORAL_TABLET | ORAL | Status: DC
  Administered 2014-03-02: 2 via ORAL
  Filled 2014-03-02: qty 2

## 2014-03-02 MED ORDER — TETANUS-DIPHTH-ACELL PERTUSSIS 5-2.5-18.5 LF-MCG/0.5 IM SUSP: 0.5000 mL | Freq: Once | INTRAMUSCULAR | Status: DC

## 2014-03-02 MED ORDER — IBUPROFEN 800 MG PO TABS
800.0000 mg | ORAL_TABLET | Freq: Three times a day (TID) | ORAL | Status: DC
  Administered 2014-03-02 – 2014-03-03 (×5): 800 mg via ORAL
  Filled 2014-03-02 (×6): qty 1

## 2014-03-02 MED ORDER — DIBUCAINE 1 % RE OINT: 1.0000 "application " | TOPICAL_OINTMENT | RECTAL | Status: DC | PRN

## 2014-03-02 MED ORDER — ZOLPIDEM TARTRATE 5 MG PO TABS: 5.0000 mg | ORAL_TABLET | Freq: Every evening | ORAL | Status: DC | PRN

## 2014-03-02 MED ORDER — FERROUS SULFATE 325 (65 FE) MG PO TABS
325.0000 mg | ORAL_TABLET | Freq: Two times a day (BID) | ORAL | Status: DC
  Administered 2014-03-02 – 2014-03-03 (×3): 325 mg via ORAL
  Filled 2014-03-02 (×3): qty 1

## 2014-03-02 MED ORDER — MAGNESIUM HYDROXIDE 400 MG/5ML PO SUSP: 30.0000 mL | ORAL | Status: DC | PRN

## 2014-03-02 MED ORDER — METHYLERGONOVINE MALEATE 0.2 MG/ML IJ SOLN: 0.2000 mg | INTRAMUSCULAR | Status: DC | PRN

## 2014-03-02 MED ORDER — SIMETHICONE 80 MG PO CHEW
80.0000 mg | CHEWABLE_TABLET | ORAL | Status: DC | PRN
  Administered 2014-03-02 (×2): 80 mg via ORAL
  Filled 2014-03-02 (×2): qty 1

## 2014-03-02 MED ORDER — LANOLIN HYDROUS EX OINT: TOPICAL_OINTMENT | CUTANEOUS | Status: DC | PRN

## 2014-03-02 MED ORDER — SODIUM CHLORIDE 0.9 % IJ SOLN: 3.0000 mL | INTRAMUSCULAR | Status: DC | PRN

## 2014-03-02 MED ORDER — METHYLERGONOVINE MALEATE 0.2 MG PO TABS: 0.2000 mg | ORAL_TABLET | ORAL | Status: DC | PRN

## 2014-03-02 MED ORDER — MEASLES, MUMPS & RUBELLA VAC ~~LOC~~ INJ
0.5000 mL | INJECTION | Freq: Once | SUBCUTANEOUS | Status: DC
  Filled 2014-03-02: qty 0.5

## 2014-03-02 NOTE — Progress Notes (Signed)
CM / UR chart review completed.  

## 2014-03-02 NOTE — Anesthesia Postprocedure Evaluation (Signed)
  Anesthesia Post-op Note  Anesthesia Post Note  Patient: Maria DeanerKierra D Pickford  Procedure(s) Performed: * No procedures listed *  Anesthesia type: Epidural  Patient location: Mother/Baby  Post pain: Pain level controlled  Post assessment: Post-op Vital signs reviewed  Last Vitals:  Filed Vitals:   03/02/14 0747  BP: 121/74  Pulse: 102  Temp: 37.4 C  Resp: 20    Post vital signs: Reviewed  Level of consciousness:alert  Complications: No apparent anesthesia complications

## 2014-03-02 NOTE — Lactation Note (Signed)
This note was copied from the chart of Maria Hartford FinancialKierra Skare. Lactation Consultation Note  Patient Name: Maria Olson ZOXWR'UToday's Date: 03/02/2014 Reason for consult: Initial assessment  Infant on breast upon entering room.  Infant had already been feeding for 25 minutes.  Mom reports hearing swallows.  LS-9.  More depth may have been needed but infant was latched and did not get to see nipple post-latch.  Infant has breastfed x6 (10-30 min) since birth 3818 hrs old; voids-3, stools-6 since birth.  Mom denies any pain.  Reviewed with mom need for attaining depth and transferring milk.  Mom stated she knew how to hand express milk and [facility]  Reviewed feeding cues, size of infant's stomach, and cluster feeding.  Encouraged mom to continue exclusive breastfeeding with feeding cues.  Mom has experience breastfeeding previous child for 3 months but did not breastfeed her NICU baby.  Lactation brochure given and informed of support group and outpatient services and community support.  Encouraged mom to call for assistance as needed.     Maternal Data Formula Feeding for Exclusion: No Infant to breast within first hour of birth: Yes Has patient been taught Hand Expression?: Yes (pt stated she knew how and declined LC review) Does the patient have breastfeeding experience prior to this delivery?: Yes  Feeding Feeding Type: Breast Fed Length of feed: 15 min  LATCH Score/Interventions Latch: Grasps breast easily, tongue down, lips flanged, rhythmical sucking.  Audible Swallowing: A few with stimulation (mom stated hearing swallows earlier; infant sleepy at breast at moment)  Type of Nipple: Everted at rest and after stimulation  Comfort (Breast/Nipple): Soft / non-tender     Hold (Positioning): No assistance needed to correctly position infant at breast.  LATCH Score: 9  Lactation Tools Discussed/Used WIC Program: Yes   Consult Status Consult Status:  Follow-up Date: 03/03/14 Follow-up type: In-patient    Lendon KaVann, Maria Olson 03/02/2014, 6:28 PM

## 2014-03-02 NOTE — Addendum Note (Signed)
Addendum created 03/02/14 0901 by Turner DanielsJennifer L Elan Brainerd, CRNA   Modules edited: Notes Section   Notes Section:  File: [id number]

## 2014-03-03 MED ORDER — OXYCODONE-ACETAMINOPHEN 5-325 MG PO TABS: 2.0000 | ORAL_TABLET | ORAL | Status: DC | PRN

## 2014-03-03 MED ORDER — IBUPROFEN 600 MG PO TABS: 600.0000 mg | ORAL_TABLET | Freq: Four times a day (QID) | ORAL | Status: DC | PRN

## 2014-03-03 MED ORDER — DOCUSATE SODIUM 100 MG PO CAPS: 100.0000 mg | ORAL_CAPSULE | Freq: Two times a day (BID) | ORAL | Status: DC

## 2014-03-03 NOTE — Lactation Note (Signed)
This note was copied from the chart of Maria Hartford FinancialKierra Stroud. Lactation Consultation Note  Patient Name: Maria Olson Maria Olson Today's Date: 03/03/2014  mother reports breastfeeding well. Has experience breastfeeding her first child and stopped when became pregnant with her second. Breast are filling. Requested a hand pump to use as needed. Mom encouraged to feed baby 8-12 times/24 hours and with feeding cues to establish a good milk supply. Mom made aware of O/P services, breastfeeding support groups, community resources, and our phone # for post-discharge questions.    Maternal Data    Feeding Feeding Type: Breast Fed Length of feed: 15 min  LATCH Score/Interventions                      Lactation Tools Discussed/Used     Consult Status      Christella HartiganDaly, Melany Wiesman M 03/03/2014, 10:46 AM

## 2014-03-03 NOTE — Discharge Summary (Signed)
Obstetric Discharge Summary Reason for Admission: onset of labor Prenatal Procedures: NST and ultrasound Intrapartum Procedures: spontaneous vaginal delivery Postpartum Procedures: none Complications-Operative and Postpartum: 1st degree perineal laceration Hemoglobin  Date Value Ref Range Status  03/02/2014 10.4* 12.0 - 15.0 g/dL Final     HCT  Date Value Ref Range Status  03/02/2014 30.6* 36.0 - 46.0 % Final    Physical Exam:  General: alert, cooperative and appears stated age 54Lochia: appropriate Uterine Fundus: firm  Discharge Diagnoses: Term Pregnancy-delivered  Discharge Information: Date: 03/03/2014 Activity: pelvic rest Diet: routine Medications: Ibuprofen, Colace and Percocet Condition: improved Instructions: refer to practice specific booklet Discharge to: home   Newborn Data: Live born female  Birth Weight: 6 lb 12.3 oz (3070 g) APGAR: 9, 9  Home with mother.  Maria Olson H. 03/03/2014, 8:43 PM

## 2014-06-19 ENCOUNTER — Other Ambulatory Visit (HOSPITAL_COMMUNITY)
Admission: RE | Admit: 2014-06-19 | Discharge: 2014-06-19 | Disposition: A | Discharge status: Home / Self Care | Payer: Medicaid Other | Source: Ambulatory Visit | Attending: Family Medicine | Admitting: Family Medicine

## 2014-06-19 ENCOUNTER — Encounter (HOSPITAL_COMMUNITY): Payer: Self-pay

## 2014-06-19 ENCOUNTER — Emergency Department (INDEPENDENT_AMBULATORY_CARE_PROVIDER_SITE_OTHER)
Admission: EM | Admit: 2014-06-19 | Discharge: 2014-06-19 | Disposition: A | Discharge status: Home / Self Care | Payer: Medicaid Other | Source: Home / Self Care | Attending: Family Medicine | Admitting: Family Medicine

## 2014-06-19 DIAGNOSIS — Z113 Encounter for screening for infections with a predominantly sexual mode of transmission: Secondary | ICD-10-CM | POA: Insufficient documentation

## 2014-06-19 DIAGNOSIS — N760 Acute vaginitis: Secondary | ICD-10-CM | POA: Diagnosis present

## 2014-06-19 DIAGNOSIS — Z202 Contact with and (suspected) exposure to infections with a predominantly sexual mode of transmission: Secondary | ICD-10-CM

## 2014-06-19 LAB — RPR

## 2014-06-19 LAB — POCT URINALYSIS DIP (DEVICE)
Bilirubin Urine: NEGATIVE
GLUCOSE, UA: NEGATIVE mg/dL
Hgb urine dipstick: NEGATIVE
LEUKOCYTES UA: NEGATIVE
NITRITE: NEGATIVE
PH: 6.5 (ref 5.0–8.0)
Protein, ur: NEGATIVE mg/dL
Specific Gravity, Urine: 1.02 (ref 1.005–1.030)
Urobilinogen, UA: 2 mg/dL — ABNORMAL HIGH (ref 0.0–1.0)

## 2014-06-19 LAB — HIV ANTIBODY (ROUTINE TESTING W REFLEX): HIV 1&2 Ab, 4th Generation: NONREACTIVE

## 2014-06-19 LAB — POCT PREGNANCY, URINE: Preg Test, Ur: NEGATIVE

## 2014-06-19 MED ORDER — LIDOCAINE HCL (PF) 1 % IJ SOLN
INTRAMUSCULAR | Status: AC
  Filled 2014-06-19: qty 5

## 2014-06-19 MED ORDER — AZITHROMYCIN 250 MG PO TABS
ORAL_TABLET | ORAL | Status: AC
  Filled 2014-06-19: qty 4

## 2014-06-19 MED ORDER — CEFTRIAXONE SODIUM 250 MG IJ SOLR
INTRAMUSCULAR | Status: AC
  Filled 2014-06-19: qty 250

## 2014-06-19 MED ORDER — AZITHROMYCIN 250 MG PO TABS
1000.0000 mg | ORAL_TABLET | Freq: Once | ORAL | Status: AC
  Administered 2014-06-19: 1000 mg via ORAL

## 2014-06-19 MED ORDER — CEFTRIAXONE SODIUM 250 MG IJ SOLR
250.0000 mg | Freq: Once | INTRAMUSCULAR | Status: AC
  Administered 2014-06-19: 250 mg via INTRAMUSCULAR

## 2014-06-19 NOTE — ED Provider Notes (Signed)
CSN: [id number]     Arrival date & time 06/19/14  1525 History   First MD Initiated Contact with Patient 06/19/14 1553     Chief Complaint  Patient presents with  . Exposure to STD   (Consider location/radiation/quality/duration/timing/severity/associated sxs/prior Treatment) HPI Comments: Had unprotected intercourse about one month ago with new partner. Was recently notified by this partner that he was treated for unknown SIT and that she should be tested.  Reports herself to be otherwise healthy PCP: Dr. Julio Sickssei-Bonsu GYN: Dr. Dareen PianoAnderson Describes trace malodorous vaginal discharge since 06/16/2014 with vague mild pelvic discomfort.   Patient is a 23 y.o. female presenting with STD exposure. The history is provided by the patient.  Exposure to STD This is a new problem.    Past Medical History  Diagnosis Date  . No pertinent past medical history   . Preterm labor    Past Surgical History  Procedure Laterality Date  . Cesarean section     Family History  Problem Relation Age of Onset  . Asthma Brother    History  Substance Use Topics  . Smoking status: Never Smoker   . Smokeless tobacco: Never Used  . Alcohol Use: No   OB History    Gravida Para Term Preterm AB TAB SAB Ectopic Multiple Living   5 3 2 1 2 2  0 0 0 3     Review of Systems  All other systems reviewed and are negative.   Allergies  Review of patient's allergies indicates no known allergies.  Home Medications   Prior to Admission medications   Medication Sig Start Date End Date Taking? Authorizing Provider  docusate sodium (COLACE) 100 MG capsule Take 1 capsule (100 mg total) by mouth 2 (two) times daily. 03/03/14   Kendra H. Tenny Crawoss, MD  ibuprofen (ADVIL,MOTRIN) 600 MG tablet Take 1 tablet (600 mg total) by mouth every 6 (six) hours as needed. 03/03/14   Kendra H. Tenny Crawoss, MD  oxyCODONE-acetaminophen (ROXICET) 5-325 MG per tablet Take 2 tablets by mouth every 4 (four) hours as needed. May take 1-2 tablets every  4-6 hours as needed for pain 03/03/14   Kendra H. Tenny Crawoss, MD  Prenatal Vit-Fe Fumarate-FA (PRENATAL MULTIVITAMIN) TABS tablet Take 1 tablet by mouth daily at 12 noon.    Historical Provider, MD   BP 137/84 mmHg  Pulse 79  Temp(Src) 98.4 F (36.9 C) (Oral)  Resp 20  SpO2 98%  LMP 06/08/2014 Physical Exam  Constitutional: She is oriented to person, place, and time. She appears well-developed and well-nourished. No distress.  HENT:  Head: Normocephalic and atraumatic.  Eyes: Conjunctivae are normal. No scleral icterus.  Cardiovascular: Normal rate.   Pulmonary/Chest: Effort normal.  Abdominal: Soft. Bowel sounds are normal. She exhibits no distension. There is no tenderness. There is no CVA tenderness.  Genitourinary: Vagina normal and uterus normal. Pelvic exam was performed with patient supine. There is no rash, tenderness or lesion on the right labia. There is no rash, tenderness or lesion on the left labia. Cervix exhibits no motion tenderness, no discharge and no friability. Right adnexum displays no mass, no tenderness and no fullness. Left adnexum displays no mass, no tenderness and no fullness.  Musculoskeletal: Normal range of motion.  Neurological: She is alert and oriented to person, place, and time.  Skin: Skin is warm and dry. No rash noted. No erythema.  Psychiatric: She has a normal mood and affect. Her behavior is normal.  Nursing note and vitals reviewed.   ED  Course  Procedures (including critical care time) Labs Review Labs Reviewed  POCT URINALYSIS DIP (DEVICE) - Abnormal; Notable for the following:    Ketones, ur TRACE (*)    Urobilinogen, UA 2.0 (*)    All other components within normal limits  RPR  HIV ANTIBODY (ROUTINE TESTING)  POCT PREGNANCY, URINE  CERVICOVAGINAL ANCILLARY ONLY    Imaging Review No results found.   MDM   1. STD exposure     No clinical indication of PID,TOA. [facility] with  azithromycin 1 gram po and ceftriazone 250mg  IM. Advised safe sex practices and OBGYN follow up if no improvement.  Cervicovaginal swabs and HIV and RPR testing sent.     Ria ClockJennifer Lee H Posie Lillibridge, [place] 06/19/14 [phone]

## 2014-06-19 NOTE — ED Notes (Signed)
Reports sex w new partner who has advised to be checked. She is requesting to be checked and treated

## 2014-06-19 NOTE — Discharge Instructions (Signed)

## 2014-06-20 ENCOUNTER — Encounter (HOSPITAL_COMMUNITY): Payer: Self-pay

## 2014-06-20 LAB — CERVICOVAGINAL ANCILLARY ONLY
Chlamydia: NEGATIVE
NEISSERIA GONORRHEA: NEGATIVE

## 2014-06-21 LAB — CERVICOVAGINAL ANCILLARY ONLY
WET PREP (BD AFFIRM): NEGATIVE
Wet Prep (BD Affirm): NEGATIVE
Wet Prep (BD Affirm): NEGATIVE

## 2014-11-06 ENCOUNTER — Encounter (HOSPITAL_COMMUNITY): Payer: Self-pay | Admitting: *Deleted

## 2014-11-06 ENCOUNTER — Emergency Department (HOSPITAL_COMMUNITY)
Admission: EM | Admit: 2014-11-06 | Discharge: 2014-11-06 | Disposition: A | Discharge status: Home / Self Care | Payer: Medicaid Other | Source: Home / Self Care | Attending: Family Medicine | Admitting: Family Medicine

## 2014-11-06 DIAGNOSIS — N926 Irregular menstruation, unspecified: Secondary | ICD-10-CM

## 2014-11-06 LAB — POCT URINALYSIS DIP (DEVICE)
Bilirubin Urine: NEGATIVE
Glucose, UA: NEGATIVE mg/dL
KETONES UR: NEGATIVE mg/dL
Leukocytes, UA: NEGATIVE
NITRITE: NEGATIVE
PROTEIN: NEGATIVE mg/dL
Specific Gravity, Urine: >=1.03 (ref 1.005–1.030)
Urobilinogen, UA: 1 mg/dL (ref 0.0–1.0)
pH: 6 (ref 5.0–8.0)

## 2014-11-06 LAB — POCT PREGNANCY, URINE: Preg Test, Ur: NEGATIVE

## 2014-11-06 NOTE — ED Provider Notes (Signed)
Maria Olson is a 24 y.o. female who presents to Urgent Care today for vaginal bleeding. Patient has had light vaginal bleeding for the past 7 days or so. Patient's last Mr. Was in mid February. She uses the NuvaRing for contraception. She left her liver and a little too long and developed some vaginal bleeding. She then remove the NuvaRing is had mild light vaginal bleeding off and on for the past several days. She has not reinserted a new NuvaRing yet. She has been evaluated by her OB/GYN and has an appointment coming up with a primary care provider in a few days. She is here wondering why she is having persistent bleeding. Eyes any history of excessive bleeding after dental procedures or previous episodes of irregular bleeding.   Past Medical History  Diagnosis Date  . No pertinent past medical history   . Preterm labor    Past Surgical History  Procedure Laterality Date  . Cesarean section     History  Substance Use Topics  . Smoking status: Never Smoker   . Smokeless tobacco: Never Used  . Alcohol Use: No   ROS as above Medications: No current facility-administered medications for this encounter.   Current Outpatient Prescriptions  Medication Sig Dispense Refill  . docusate sodium (COLACE) 100 MG capsule Take 1 capsule (100 mg total) by mouth 2 (two) times daily. 60 capsule 0  . ibuprofen (ADVIL,MOTRIN) 600 MG tablet Take 1 tablet (600 mg total) by mouth every 6 (six) hours as needed. 90 tablet 0  . oxyCODONE-acetaminophen (ROXICET) 5-325 MG per tablet Take 2 tablets by mouth every 4 (four) hours as needed. May take 1-2 tablets every 4-6 hours as needed for pain 30 tablet 0  . Prenatal Vit-Fe Fumarate-FA (PRENATAL MULTIVITAMIN) TABS tablet Take 1 tablet by mouth daily at 12 noon.     No Known Allergies   Exam:  BP 132/88 mmHg  Pulse 70  Temp(Src) 98 F (36.7 C)  Resp 16  SpO2 100%  LMP 10/31/2014 Gen: Well NAD HEENT: EOMI,  MMM Lungs: Normal work of breathing.  CTABL Heart: RRR no MRG Abd: NABS, Soft. Nondistended, Nontender Exts: Brisk capillary refill, warm and well perfused.   Results for orders placed or performed during the hospital encounter of 11/06/14 (from the past 24 hour(s))  POCT urinalysis dip (device)     Status: Abnormal   Collection Time: 11/06/14 12:38 PM  Result Value Ref Range   Glucose, UA NEGATIVE NEGATIVE mg/dL   Bilirubin Urine NEGATIVE NEGATIVE   Ketones, ur NEGATIVE NEGATIVE mg/dL   Specific Gravity, Urine >=1.030 1.005 - 1.030   Hgb urine dipstick LARGE (A) NEGATIVE   pH 6.0 5.0 - 8.0   Protein, ur NEGATIVE NEGATIVE mg/dL   Urobilinogen, UA 1.0 0.0 - 1.0 mg/dL   Nitrite NEGATIVE NEGATIVE   Leukocytes, UA NEGATIVE NEGATIVE  Pregnancy, urine POC     Status: None   Collection Time: 11/06/14 12:39 PM  Result Value Ref Range   Preg Test, Ur NEGATIVE NEGATIVE   No results found.  Assessment and Plan: 24 y.o. female with irregular bleeding due to withdrawal of hormonal contraception. Recommend restart NuvaRing and follow-up with PCP.  Discussed warning signs or symptoms. Please see discharge instructions. Patient expresses understanding.     Rodolph BongEvan S Corey, MD 11/06/14 1259
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
## 2014-11-06 NOTE — Discharge Instructions (Signed)
Thank you for coming in today. Follow up with your doctor.  Re-start the ring.  Return as needed.     Abnormal Uterine Bleeding Abnormal uterine bleeding can affect women at various stages in life, including teenagers, women in their reproductive years, pregnant women, and women who have reached menopause. Several kinds of uterine bleeding are considered abnormal, including:  Bleeding or spotting between periods.   Bleeding after sexual intercourse.   Bleeding that is heavier or more than normal.   Periods that last longer than usual.  Bleeding after menopause.  Many cases of abnormal uterine bleeding are minor and simple to treat, while others are more serious. Any type of abnormal bleeding should be evaluated by your health care provider. Treatment will depend on the cause of the bleeding. HOME CARE INSTRUCTIONS Monitor your condition for any changes. The following actions may help to alleviate any discomfort you are experiencing:  Avoid the use of tampons and douches as directed by your health care provider.  Change your pads frequently. You should get regular pelvic exams and Pap tests. Keep all follow-up appointments for diagnostic tests as directed by your health care provider.  SEEK MEDICAL CARE IF:   Your bleeding lasts more than 1 week.   You feel dizzy at times.  SEEK IMMEDIATE MEDICAL CARE IF:   You pass out.   You are changing pads every 15 to 30 minutes.   You have abdominal pain.  You have a fever.   You become sweaty or weak.   You are passing large blood clots from the vagina.   You start to feel nauseous and vomit. MAKE SURE YOU:   Understand these instructions.  Will watch your condition.  Will get help right away if you are not doing well or get worse. Document Released: 08/05/2005 Document Revised: 08/10/2013 Document Reviewed: 03/04/2013 [facility] Patient Information 2015 [employer] [place]. This information is not intended to replace  advice given to you by your health care provider. Make sure you discuss any questions you have with your health care provider.

## 2014-11-15 ENCOUNTER — Encounter (HOSPITAL_COMMUNITY): Payer: Self-pay | Admitting: *Deleted

## 2014-11-15 ENCOUNTER — Emergency Department (HOSPITAL_COMMUNITY)
Admission: EM | Admit: 2014-11-15 | Discharge: 2014-11-15 | Disposition: A | Discharge status: Home / Self Care | Payer: Medicaid Other | Attending: Emergency Medicine | Admitting: Emergency Medicine

## 2014-11-15 DIAGNOSIS — Z79899 Other long term (current) drug therapy: Secondary | ICD-10-CM | POA: Diagnosis not present

## 2014-11-15 DIAGNOSIS — J020 Streptococcal pharyngitis: Secondary | ICD-10-CM | POA: Diagnosis not present

## 2014-11-15 DIAGNOSIS — J029 Acute pharyngitis, unspecified: Secondary | ICD-10-CM | POA: Diagnosis present

## 2014-11-15 LAB — RAPID STREP SCREEN (MED CTR MEBANE ONLY): STREPTOCOCCUS, GROUP A SCREEN (DIRECT): POSITIVE — AB

## 2014-11-15 MED ORDER — DEXAMETHASONE 1 MG/ML PO CONC
10.0000 mg | Freq: Once | ORAL | Status: DC
  Filled 2014-11-15: qty 10

## 2014-11-15 MED ORDER — AMOXICILLIN 250 MG/5ML PO SUSR: 500.0000 mg | Freq: Three times a day (TID) | ORAL | Status: DC

## 2014-11-15 MED ORDER — DEXAMETHASONE 10 MG/ML FOR PEDIATRIC ORAL USE
10.0000 mg | Freq: Once | INTRAMUSCULAR | Status: AC
  Administered 2014-11-15: 10 mg via ORAL
  Filled 2014-11-15: qty 1

## 2014-11-15 MED ORDER — AMOXICILLIN 250 MG/5ML PO SUSR
500.0000 mg | Freq: Once | ORAL | Status: AC
  Administered 2014-11-15: 500 mg via ORAL
  Filled 2014-11-15: qty 10

## 2014-11-15 NOTE — Discharge Instructions (Signed)
Take amoxicillin as directed for 7 days and discard the remaining. Refer to attached documents for more information. Return to the ED with worsening or concerning symptoms.  °

## 2014-11-15 NOTE — ED Provider Notes (Signed)
CSN: [id number]     Arrival date & time 11/15/14  1251 History  This chart was scribed for non-physician practitioner, Emilia BeckKaitlyn Cassanda Walmer, PA-C working with Bethann BerkshireJoseph Zammit, MD by Luisa DagoPriscilla Tutu, Medical Scribe. This patient was seen in room TR06C/TR06C and the patient's care was started at 1:54 PM.      Chief Complaint  Patient presents with  . Sore Throat   Patient is a 24 y.o. female presenting with pharyngitis. The history is provided by the patient and medical records. No language interpreter was used.  Sore Throat This is a new problem. The current episode started yesterday. The problem occurs constantly. The problem has not changed since onset.Pertinent negatives include no chest pain, no abdominal pain, no headaches and no shortness of breath. The symptoms are aggravated by eating, swallowing and drinking. Nothing relieves the symptoms. She has tried nothing for the symptoms.   HPI Comments: Maria Olson is a 24 y.o. female who presents to the Emergency Department complaining of sudden onset gradually worsening sore throat that started yesterday. Pt states that she has noted some associated swelling and terrible pain which prevented her from taking any OTC medication prior to her arrival. Pt denies any sick contacts with similar symptoms. She denies any fever, neck pain, visual disturbance, CP, cough, SOB, abdominal pain, nausea, emesis, diarrhea, urinary symptoms, back pain, HA, weakness, numbness and rash as associated symptoms.     Past Medical History  Diagnosis Date  . No pertinent past medical history   . Preterm labor    Past Surgical History  Procedure Laterality Date  . Cesarean section     Family History  Problem Relation Age of Onset  . Asthma Brother    History  Substance Use Topics  . Smoking status: Never Smoker   . Smokeless tobacco: Never Used  . Alcohol Use: No   OB History    Gravida Para Term Preterm AB TAB SAB Ectopic Multiple Living   5 3 2 1 2 2  0 0  0 3     Review of Systems  Constitutional: Negative for fever and chills.  HENT: Positive for sore throat.   Respiratory: Negative for cough and shortness of breath.   Cardiovascular: Negative for chest pain.  Gastrointestinal: Negative for abdominal pain.  Neurological: Negative for headaches.  All other systems reviewed and are negative.     Allergies  Review of patient's allergies indicates no known allergies.  Home Medications   Prior to Admission medications   Medication Sig Start Date End Date Taking? Authorizing Provider  docusate sodium (COLACE) 100 MG capsule Take 1 capsule (100 mg total) by mouth 2 (two) times daily. 03/03/14   Waynard ReedsKendra Ross, MD  ibuprofen (ADVIL,MOTRIN) 600 MG tablet Take 1 tablet (600 mg total) by mouth every 6 (six) hours as needed. 03/03/14   Waynard ReedsKendra Ross, MD  oxyCODONE-acetaminophen (ROXICET) 5-325 MG per tablet Take 2 tablets by mouth every 4 (four) hours as needed. May take 1-2 tablets every 4-6 hours as needed for pain 03/03/14   Waynard ReedsKendra Ross, MD  Prenatal Vit-Fe Fumarate-FA (PRENATAL MULTIVITAMIN) TABS tablet Take 1 tablet by mouth daily at 12 noon.    Historical Provider, MD   BP 131/76 mmHg  Pulse 98  Temp(Src) 98.3 F (36.8 C) (Oral)  Resp 18  Ht 5\' 3"  (1.6 m)  Wt 170 lb (77.111 kg)  BMI 30.12 kg/m2  SpO2 100%  LMP 10/24/2014  Physical Exam  Constitutional: She is oriented to person, place, and time.  She appears well-developed and well-nourished. No distress.  HENT:  Head: Normocephalic and atraumatic.  Mouth/Throat: Uvula is midline. Oropharyngeal exudate and posterior oropharyngeal erythema present.  Eyes: Conjunctivae and EOM are normal.  Neck: Neck supple.  Cardiovascular: Normal rate.   No murmur heard. Pulmonary/Chest: Effort normal. No respiratory distress.  Musculoskeletal: Normal range of motion.  Lymphadenopathy:    She has cervical adenopathy.  Neurological: She is alert and oriented to person, place, and time.  Skin: Skin  is warm and dry.  Psychiatric: She has a normal mood and affect. Her behavior is normal.  Nursing note and vitals reviewed.      Procedures (including critical care time)  DIAGNOSTIC STUDIES: Oxygen Saturation is 100% on RA, normal by my interpretation.    COORDINATION OF CARE: 1:56 PM-Will proceed to treat the pt for strep throat. Pt advised of plan for treatment and pt agrees.  Labs Review Labs Reviewed  RAPID STREP SCREEN    Imaging Review No results found.   EKG Interpretation None      MDM   Final diagnoses:  Strep throat    1:59 PM Patient will be treated for strep throat based on clinical appearance. Vitals stable and patient afebrile.   I personally performed the services described in this documentation, which was scribed in my presence. The recorded information has been reviewed and is accurate.    Emilia Beck, PA-C 11/15/14 1400  Bethann Berkshire, MD 11/15/14 [phone]

## 2014-11-15 NOTE — ED Notes (Signed)
Pt reports sore throat. 

## 2014-11-15 NOTE — ED Notes (Signed)
Declined W/C at D/C and was escorted to lobby by RN. 

## 2015-05-18 ENCOUNTER — Encounter (HOSPITAL_COMMUNITY): Payer: Self-pay | Admitting: *Deleted

## 2015-05-18 ENCOUNTER — Inpatient Hospital Stay (HOSPITAL_COMMUNITY)
Admission: AD | Admit: 2015-05-18 | Discharge: 2015-05-18 | Disposition: A | Discharge status: Home / Self Care | Payer: Medicaid Other | Source: Ambulatory Visit | Attending: Obstetrics and Gynecology | Admitting: Obstetrics and Gynecology

## 2015-05-18 DIAGNOSIS — Z3202 Encounter for pregnancy test, result negative: Secondary | ICD-10-CM | POA: Insufficient documentation

## 2015-05-18 DIAGNOSIS — R109 Unspecified abdominal pain: Secondary | ICD-10-CM | POA: Insufficient documentation

## 2015-05-18 DIAGNOSIS — Z3200 Encounter for pregnancy test, result unknown: Secondary | ICD-10-CM | POA: Diagnosis present

## 2015-05-18 LAB — URINALYSIS, ROUTINE W REFLEX MICROSCOPIC
BILIRUBIN URINE: NEGATIVE
Glucose, UA: NEGATIVE mg/dL
Hgb urine dipstick: NEGATIVE
KETONES UR: 15 mg/dL — AB
LEUKOCYTES UA: NEGATIVE
NITRITE: NEGATIVE
PH: 6 (ref 5.0–8.0)
PROTEIN: NEGATIVE mg/dL
Specific Gravity, Urine: >1.03 — ABNORMAL HIGH (ref 1.005–1.030)
UROBILINOGEN UA: 0.2 mg/dL (ref 0.0–1.0)

## 2015-05-18 LAB — POCT PREGNANCY, URINE: Preg Test, Ur: NEGATIVE

## 2015-05-18 MED ORDER — IBUPROFEN 600 MG PO TABS: 600.0000 mg | ORAL_TABLET | Freq: Four times a day (QID) | ORAL | Status: DC | PRN

## 2015-05-18 NOTE — MAU Note (Signed)
Patient states she started having lower abdominal pain starting today and also "feels wet down there."  States LMP was first week of September and thinks she may be pregnant.

## 2015-05-18 NOTE — Discharge Instructions (Signed)
Abdominal Pain, Women °Abdominal (stomach, pelvic, or belly) pain can be caused by many things. It is important to tell your doctor: °· The location of the pain. °· Does it come and go or is it present all the time? °· Are there things that start the pain (eating certain foods, exercise)? °· Are there other symptoms associated with the pain (fever, nausea, vomiting, diarrhea)? °All of this is helpful to know when trying to find the cause of the pain. °CAUSES  °· Stomach: virus or bacteria infection, or ulcer. °· Intestine: appendicitis (inflamed appendix), regional ileitis (Crohn's disease), ulcerative colitis (inflamed colon), irritable bowel syndrome, diverticulitis (inflamed diverticulum of the colon), or cancer of the stomach or intestine. °· Gallbladder disease or stones in the gallbladder. °· Kidney disease, kidney stones, or infection. °· Pancreas infection or cancer. °· Fibromyalgia (pain disorder). °· Diseases of the female organs: °¨ Uterus: fibroid (non-cancerous) tumors or infection. °¨ Fallopian tubes: infection or tubal pregnancy. °¨ Ovary: cysts or tumors. °¨ Pelvic adhesions (scar tissue). °¨ Endometriosis (uterus lining tissue growing in the pelvis and on the pelvic organs). °¨ Pelvic congestion syndrome (female organs filling up with blood just before the menstrual period). °¨ Pain with the menstrual period. °¨ Pain with ovulation (producing an egg). °¨ Pain with an IUD (intrauterine device, birth control) in the uterus. °¨ Cancer of the female organs. °· Functional pain (pain not caused by a disease, may improve without treatment). °· Psychological pain. °· Depression. °DIAGNOSIS  °Your doctor will decide the seriousness of your pain by doing an examination. °· Blood tests. °· X-rays. °· Ultrasound. °· CT scan (computed tomography, special type of X-ray). °· MRI (magnetic resonance imaging). °· Cultures, for infection. °· Barium enema (dye inserted in the large intestine, to better view it with  X-rays). °· Colonoscopy (looking in intestine with a lighted tube). °· Laparoscopy (minor surgery, looking in abdomen with a lighted tube). °· Major abdominal exploratory surgery (looking in abdomen with a large incision). °TREATMENT  °The treatment will depend on the cause of the pain.  °· Many cases can be observed and treated at home. °· Over-the-counter medicines recommended by your caregiver. °· Prescription medicine. °· Antibiotics, for infection. °· Birth control pills, for painful periods or for ovulation pain. °· Hormone treatment, for endometriosis. °· Nerve blocking injections. °· Physical therapy. °· Antidepressants. °· Counseling with a psychologist or psychiatrist. °· Minor or major surgery. °HOME CARE INSTRUCTIONS  °· Do not take laxatives, unless directed by your caregiver. °· Take over-the-counter pain medicine only if ordered by your caregiver. Do not take aspirin because it can cause an upset stomach or bleeding. °· Try a clear liquid diet (broth or water) as ordered by your caregiver. Slowly move to a bland diet, as tolerated, if the pain is related to the stomach or intestine. °· Have a thermometer and take your temperature several times a day, and record it. °· Bed rest and sleep, if it helps the pain. °· Avoid sexual intercourse, if it causes pain. °· Avoid stressful situations. °· Keep your follow-up appointments and tests, as your caregiver orders. °· If the pain does not go away with medicine or surgery, you may try: °¨ Acupuncture. °¨ Relaxation exercises (yoga, meditation). °¨ Group therapy. °¨ Counseling. °SEEK MEDICAL CARE IF:  °· You notice certain foods cause stomach pain. °· Your home care treatment is not helping your pain. °· You need stronger pain medicine. °· You want your IUD removed. °· You feel faint or   lightheaded. °· You develop nausea and vomiting. °· You develop a rash. °· You are having side effects or an allergy to your medicine. °SEEK IMMEDIATE MEDICAL CARE IF:  °· Your  pain does not go away or gets worse. °· You have a fever. °· Your pain is felt only in portions of the abdomen. The right side could possibly be appendicitis. The left lower portion of the abdomen could be colitis or diverticulitis. °· You are passing blood in your stools (bright red or black tarry stools, with or without vomiting). °· You have blood in your urine. °· You develop chills, with or without a fever. °· You pass out. °MAKE SURE YOU:  °· Understand these instructions. °· Will watch your condition. °· Will get help right away if you are not doing well or get worse. °Document Released: 06/02/2007 Document Revised: 12/20/2013 Document Reviewed: 06/22/2009 °ExitCare® Patient Information ©2015 [employer]. This information is not intended to replace advice given to you by your health care provider. Make sure you discuss any questions you have with your health care provider. ° °

## 2015-05-18 NOTE — MAU Provider Note (Signed)
Chief Complaint: Abdominal Cramping  First Provider Initiated Contact with Patient 05/18/15 1329      SUBJECTIVE HPI: Maria Olson is a 24 y.o. [phone] female who presents to Maternity Admissions requseting pregnancy test. Having low abd cramping and wet sensation in her vaginal today. Patient's last menstrual period was 04/24/2015 (approximate). Not late for period. Hasn't taken pregnancy tests. Using contraception.   Location: suprapubic Quality: cramping Severity: 5/10 on pain scale Duration: <24 hours Context: none Timing: intermittent Modifying factors: none. Hasn't tried anything to treat the pain. Associated signs and symptoms: Negative for fever, chills, his perennial, intermenstrual bleeding, vaginal itching, vaginal odor.  Past Medical History  Diagnosis Date  . No pertinent past medical history   . Preterm labor    OB History  Gravida Para Term Preterm AB SAB TAB Ectopic Multiple Living  0 2 0 0 3    # Outcome Date GA Lbr Len/2nd Weight Sex Delivery Anes PTL Lv  5 Term 03/02/14 [redacted]w[redacted]d 18:50 / 00:48 6 lb 12.3 oz (3.07 kg) F VBAC EPI  Y  4 TAB 2014          3 TAB 2014          2 Preterm 03/27/12 [redacted]w[redacted]d   M CS-LTranv Spinal Y Y     Comments: gastroschisis  1 Term 04/23/11 [redacted]w[redacted]d 10:26 / 01:14 7 lb 2.8 oz (3.255 kg) F Vag-Spont EPI  Y     Comments: None     Past Surgical History  Procedure Laterality Date  . Cesarean section     Social History   Social History  . Marital Status: Single    Spouse Name: N/A  . Number of Children: N/A  . Years of Education: N/A   Occupational History  . Not on file.   Social History Main Topics  . Smoking status: Never Smoker   . Smokeless tobacco: Never Used  . Alcohol Use: No  . Drug Use: No  . Sexual Activity: Yes    Birth Control/ Protection: Inserts   Other Topics Concern  . Not on file   Social History Narrative   No current facility-administered medications on file prior to encounter.   Current  Outpatient Prescriptions on File Prior to Encounter  Medication Sig Dispense Refill  . amoxicillin (AMOXIL) 250 MG/5ML suspension Take 10 mLs (500 mg total) by mouth 3 (three) times daily. 300 mL 0  . docusate sodium (COLACE) 100 MG capsule Take 1 capsule (100 mg total) by mouth 2 (two) times daily. 60 capsule 0  . ibuprofen (ADVIL,MOTRIN) 600 MG tablet Take 1 tablet (600 mg total) by mouth every 6 (six) hours as needed. 90 tablet 0  . oxyCODONE-acetaminophen (ROXICET) 5-325 MG per tablet Take 2 tablets by mouth every 4 (four) hours as needed. May take 1-2 tablets every 4-6 hours as needed for pain 30 tablet 0  . Prenatal Vit-Fe Fumarate-FA (PRENATAL MULTIVITAMIN) TABS tablet Take 1 tablet by mouth daily at 12 noon.     No Known Allergies  I have reviewed the past Medical Hx, Surgical Hx, Social Hx, Allergies and Medications.   Review of Systems  Constitutional: Positive for chills (states she always feels cold because of anemia.). Negative for fever.  Gastrointestinal: Positive for abdominal pain. Negative for nausea, vomiting, diarrhea and constipation.  Genitourinary: Positive for vaginal discharge. Negative for dysuria, urgency, frequency, hematuria, flank pain, vaginal bleeding, vaginal pain and menstrual problem.  Musculoskeletal: Negative for back pain.  OBJECTIVE Patient Vitals for the past 24 hrs:  BP Temp Temp src Pulse Resp Height Weight  05/18/15 1247 123/76 mmHg 98 F (36.7 C) Oral 103 18  (1.6 m) 179 lb 6.4 oz (81.375 kg)   Constitutional: Well-developed, well-nourished female in no acute distress.  Cardiovascular: Mild tachycardia.  Respiratory: normal rate and effort.  GI: Abd soft, non-tender. Neurologic: Alert and oriented x 4.  GU: Refused  LAB RESULTS Results for orders placed or performed during the hospital encounter of 05/18/15 (from the past 24 hour(s))  Urinalysis, Routine w reflex microscopic (not at [facility])     Status: Abnormal   Collection Time:  05/18/15 12:47 PM  Result Value Ref Range   Color, Urine YELLOW YELLOW   APPearance CLEAR CLEAR   Specific Gravity, Urine >1.030 (H) 1.005 - 1.030   pH 6.0 5.0 - 8.0   Glucose, UA NEGATIVE NEGATIVE mg/dL   Hgb urine dipstick NEGATIVE NEGATIVE   Bilirubin Urine NEGATIVE NEGATIVE   Ketones, ur 15 (A) NEGATIVE mg/dL   Protein, ur NEGATIVE NEGATIVE mg/dL   Urobilinogen, UA 0.2 0.0 - 1.0 mg/dL   Nitrite NEGATIVE NEGATIVE   Leukocytes, UA NEGATIVE NEGATIVE  Pregnancy, urine POC     Status: None   Collection Time: 05/18/15 12:48 PM  Result Value Ref Range   Preg Test, Ur NEGATIVE NEGATIVE    IMAGING No results found.  MAU COURSE UPT, UA. Ordered wet prep, GC/chlamydia cultures, but patient refused pelvic exam. States she only wanted a pregnancy test.  MDM 24 year old nonpregnant female with mild-moderate cramping 1 day who refuses pelvic exam, wet prep were GC/Chlamydia cultures. Pain possibly caused by dysmenorrhea, but cannot rule out other causes without exam or testing. Patient is nontoxic-appearing and in no apparent distress. No obvious emergent condition present. Encouraged follow-up with her OB/GYN if no improvement in next week or follow-up in the ED if she develops fever or severe pain.  ASSESSMENT 1. Abdominal cramping   2. Negative pregnancy test    PLAN Discharge home in stable condition. Ibuprofen when necessary. Comfort measures. Follow-up Information    Follow up with TOMBLIN Cristie Hem, MD.   Specialty:  Obstetrics and Gynecology   Why:  As needed if symptoms worsen   Contact information:   [address] [place] [address] [phone]       Follow up with [facility] [place] [facility]ERN[facility] ADMISS[facility].   Why:  As needed in emergencies   Contact information:   [address] [id number] mc [place] [address] [phone]        Medication List    STOP taking these medications        amoxicillin 250  MG/5ML suspension  Commonly known as:  AMOXIL     docusate sodium 100 MG capsule  Commonly known as:  COLACE     oxyCODONE-acetaminophen 5-325 MG tablet  Commonly known as:  ROXICET      TAKE these medications        ibuprofen 600 MG tablet  Commonly known as:  ADVIL,MOTRIN  Take 1 tablet (600 mg total) by mouth every 6 (six) hours as needed for cramping.        [place], CNM 05/18/2015  1:14 PM

## 2015-07-07 ENCOUNTER — Emergency Department (HOSPITAL_COMMUNITY)
Admission: EM | Admit: 2015-07-07 | Discharge: 2015-07-07 | Discharge status: Against Medical Advice | Payer: Medicaid Other | Attending: Emergency Medicine | Admitting: Emergency Medicine

## 2015-07-07 ENCOUNTER — Encounter (HOSPITAL_COMMUNITY): Payer: Self-pay | Admitting: *Deleted

## 2015-07-07 ENCOUNTER — Ambulatory Visit (HOSPITAL_COMMUNITY): Payer: Self-pay

## 2015-07-07 DIAGNOSIS — Z3200 Encounter for pregnancy test, result unknown: Secondary | ICD-10-CM | POA: Insufficient documentation

## 2015-07-07 DIAGNOSIS — R109 Unspecified abdominal pain: Secondary | ICD-10-CM | POA: Insufficient documentation

## 2015-07-07 DIAGNOSIS — R110 Nausea: Secondary | ICD-10-CM | POA: Insufficient documentation

## 2015-07-07 LAB — URINALYSIS, ROUTINE W REFLEX MICROSCOPIC
BILIRUBIN URINE: NEGATIVE
GLUCOSE, UA: NEGATIVE mg/dL
Hgb urine dipstick: NEGATIVE
KETONES UR: NEGATIVE mg/dL
LEUKOCYTES UA: NEGATIVE
Nitrite: NEGATIVE
Protein, ur: NEGATIVE mg/dL
Specific Gravity, Urine: 1.027 (ref 1.005–1.030)
pH: 6 (ref 5.0–8.0)

## 2015-07-07 LAB — POC URINE PREG, ED: Preg Test, Ur: NEGATIVE

## 2015-07-07 MED ORDER — SODIUM CHLORIDE 0.9 % IV BOLUS (SEPSIS): 1000.0000 mL | Freq: Once | INTRAVENOUS | Status: DC

## 2015-07-07 MED ORDER — ONDANSETRON HCL 4 MG/2ML IJ SOLN: 4.0000 mg | Freq: Once | INTRAMUSCULAR | Status: DC

## 2015-07-07 NOTE — ED Notes (Signed)
Pt at desk stating she wanted to leave, pt states the wait was "ridiculous" stating she only wanted a pregnancy test. Pt read her triage complaint of abd pain and cramping, explained to patient that those complaints require a further workup. Also explained to patient if she should be a little more clear in triage we could have maybe accommodated her in the treatment she was requesting. Pt states she still wishes to sign out AMA

## 2015-07-07 NOTE — ED Notes (Addendum)
Pt complains of cramping in her lower abdomen and  nausea since Wednesday 11/9. Pt states she believes she is pregnant. Pt took a pregnancy test but states she was unable to tell from the strip. Pt states her last menstrual period was 10/25. Pt states she has noticed she is urinating and having bowel movements more frequently for the past 2 days.

## 2015-07-07 NOTE — ED Provider Notes (Cosign Needed)
Patient had arrived at ER room 14, had a chief complaint of abdominal cramping, nausea, urinary frequency and loose stools for several days.  I signed up to see the patient, and before patient was evaluated personally by me, she signed out AMA, prior to being seen.  ER staff had pt sign out AMA prior to leaving.  Initial vitals: Filed Vitals:   07/07/15 1555  BP: 121/70  Pulse: 68  Temp: 98.1 F (36.7 C)  TempSrc: Oral  Resp: 18  SpO2: 100%       Danelle BerryLeisa Lizabeth Fellner, PA-C 07/07/15 1813

## 2015-07-11 ENCOUNTER — Encounter (HOSPITAL_COMMUNITY): Payer: Self-pay | Admitting: *Deleted

## 2015-07-11 ENCOUNTER — Inpatient Hospital Stay (HOSPITAL_COMMUNITY)
Admission: AD | Admit: 2015-07-11 | Discharge: 2015-07-11 | Disposition: A | Discharge status: Home / Self Care | Payer: Medicaid Other | Source: Ambulatory Visit | Attending: Obstetrics & Gynecology | Admitting: Obstetrics & Gynecology

## 2015-07-11 ENCOUNTER — Inpatient Hospital Stay (HOSPITAL_COMMUNITY): Payer: Medicaid Other

## 2015-07-11 DIAGNOSIS — Z3A01 Less than 8 weeks gestation of pregnancy: Secondary | ICD-10-CM | POA: Diagnosis not present

## 2015-07-11 DIAGNOSIS — R109 Unspecified abdominal pain: Secondary | ICD-10-CM | POA: Insufficient documentation

## 2015-07-11 DIAGNOSIS — O9989 Other specified diseases and conditions complicating pregnancy, childbirth and the puerperium: Secondary | ICD-10-CM

## 2015-07-11 DIAGNOSIS — O26899 Other specified pregnancy related conditions, unspecified trimester: Secondary | ICD-10-CM

## 2015-07-11 DIAGNOSIS — O26891 Other specified pregnancy related conditions, first trimester: Secondary | ICD-10-CM | POA: Diagnosis not present

## 2015-07-11 DIAGNOSIS — O3680X0 Pregnancy with inconclusive fetal viability, not applicable or unspecified: Secondary | ICD-10-CM

## 2015-07-11 HISTORY — DX: Unspecified infectious disease: B99.9

## 2015-07-11 HISTORY — DX: Anemia, unspecified: D64.9

## 2015-07-11 HISTORY — DX: Unspecified ovarian cyst, unspecified side: N83.209

## 2015-07-11 LAB — ABO/RH: ABO/RH(D): O POS

## 2015-07-11 LAB — URINALYSIS, ROUTINE W REFLEX MICROSCOPIC
BILIRUBIN URINE: NEGATIVE
Glucose, UA: NEGATIVE mg/dL
HGB URINE DIPSTICK: NEGATIVE
KETONES UR: NEGATIVE mg/dL
Leukocytes, UA: NEGATIVE
NITRITE: NEGATIVE
PROTEIN: NEGATIVE mg/dL
SPECIFIC GRAVITY, URINE: 1.01 (ref 1.005–1.030)
pH: 6 (ref 5.0–8.0)

## 2015-07-11 LAB — CBC
HCT: 35.5 % — ABNORMAL LOW (ref 36.0–46.0)
Hemoglobin: 12.1 g/dL (ref 12.0–15.0)
MCH: 30 pg (ref 26.0–34.0)
MCHC: 34.1 g/dL (ref 30.0–36.0)
MCV: 88.1 fL (ref 78.0–100.0)
PLATELETS: 308 10*3/uL (ref 150–400)
RBC: 4.03 MIL/uL (ref 3.87–5.11)
RDW: 12.2 % (ref 11.5–15.5)
WBC: 2.9 10*3/uL — ABNORMAL LOW (ref 4.0–10.5)

## 2015-07-11 LAB — POCT PREGNANCY, URINE: PREG TEST UR: POSITIVE — AB

## 2015-07-11 LAB — WET PREP, GENITAL
SPERM: NONE SEEN
Trich, Wet Prep: NONE SEEN
Yeast Wet Prep HPF POC: NONE SEEN

## 2015-07-11 LAB — HCG, QUANTITATIVE, PREGNANCY: hCG, Beta Chain, Quant, S: 120 m[IU]/mL — ABNORMAL HIGH (ref <5)

## 2015-07-11 NOTE — MAU Note (Signed)
+   HPT last 4 days, all have been light.  Has been feeling crampy past wk

## 2015-07-11 NOTE — Discharge Instructions (Signed)
Abdominal Pain During Pregnancy Abdominal pain is common in pregnancy. Most of the time, it does not cause harm. There are many causes of abdominal pain. Some causes are more serious than others. Some of the causes of abdominal pain in pregnancy are easily diagnosed. Occasionally, the diagnosis takes time to understand. Other times, the cause is not determined. Abdominal pain can be a sign that something is very wrong with the pregnancy, or the pain may have nothing to do with the pregnancy at all. For this reason, always tell your health care provider if you have any abdominal discomfort. HOME CARE INSTRUCTIONS  Monitor your abdominal pain for any changes. The following actions may help to alleviate any discomfort you are experiencing:  Do not have sexual intercourse or put anything in your vagina until your symptoms go away completely.  Get plenty of rest until your pain improves.  Drink clear fluids if you feel nauseous. Avoid solid food as long as you are uncomfortable or nauseous.  Only take over-the-counter or prescription medicine as directed by your health care provider.  Keep all follow-up appointments with your health care provider. SEEK IMMEDIATE MEDICAL CARE IF:  You are bleeding, leaking fluid, or passing tissue from the vagina.  You have increasing pain or cramping.  You have persistent vomiting.  You have painful or bloody urination.  You have a fever.  You notice a decrease in your baby's movements.  You have extreme weakness or feel faint.  You have shortness of breath, with or without abdominal pain.  You develop a severe headache with abdominal pain.  You have abnormal vaginal discharge with abdominal pain.  You have persistent diarrhea.  You have abdominal pain that continues even after rest, or gets worse. MAKE SURE YOU:   Understand these instructions.  Will watch your condition.  Will get help right away if you are not doing well or get worse.     This information is not intended to replace advice given to you by your health care provider. Make sure you discuss any questions you have with your health care provider.   Document Released: 08/05/2005 Document Revised: 05/26/2013 Document Reviewed: 03/04/2013 Elsevier Interactive Patient Education 2016 Elsevier Inc.    [facility]   Francoise CeoBernard Marshall      Phone: [phone]  [facility]     Phone: [phone]  [facility]  Phone: [phone]  [facility]  Phone: [phone]  [facility]    Phone: [phone]   [facility]    Phone: [phone]  [facility]    Phone: [phone]  [facility]    Phone: [phone]  [facility]    Phone: [phone]  [facility] Phone: [phone]   [facility]  Phone: [phone]  [facility]    Phone: [phone]  [facility]   Phone: [phone]  [facility]      Phone: [phone]  [facility]    Phone: [phone]  [facility]     Phone: [phone]

## 2015-07-11 NOTE — MAU Provider Note (Signed)
History     CSN: [id number]  Arrival date and time: 07/11/15 1143   First Provider Initiated Contact with Patient 07/11/15 1315       Chief Complaint  Patient presents with  . Possible Pregnancy  . Abdominal Cramping   HPI  Maria Olson is a 24 y.o. female who presents for abdominal cramping & possible pregnancy.  Has positive home pregnancy test today. LMP 10/25; no birth control.  Reports lower abdominal cramping x 4 days, worse since yesterday. Rates as 5/10. No treatment.  Denies vaginal bleeding or vaginal discharge.  + nausea. No vomiting, diarrhea, or constipation.  Denies urinary complaints.  Last intercourse 2 days ago.   OB History    Gravida Para Term Preterm AB TAB SAB Ectopic Multiple Living   0 0 0 3      Past Medical History  Diagnosis Date  . No pertinent past medical history   . Preterm labor   . Infection     UTI  . Ovarian cyst   . Anemia     Past Surgical History  Procedure Laterality Date  . Cesarean section    . Induced abortion      Family History  Problem Relation Age of Onset  . Asthma Brother   . Asthma Daughter   . Cancer Paternal Grandmother     Social History  Substance Use Topics  . Smoking status: Never Smoker   . Smokeless tobacco: Never Used  . Alcohol Use: No    Allergies: No Known Allergies  No prescriptions prior to admission    Review of Systems  Constitutional: Negative.   Gastrointestinal: Positive for nausea and abdominal pain. Negative for vomiting, diarrhea and constipation.  Genitourinary: Negative.    Physical Exam   Blood pressure 116/64, pulse 88, temperature 98.4 F (36.9 C), temperature source Oral, resp. rate 18, height 5' 3.5" (1.613 m), weight 173 lb 6.4 oz (78.654 kg), last menstrual period 06/13/2015, unknown if currently breastfeeding.  Physical Exam  Nursing note and vitals reviewed. Constitutional: She is oriented to person, place, and time. She appears well-developed  and well-nourished. No distress.  HENT:  Head: Normocephalic and atraumatic.  Eyes: Conjunctivae are normal. Right eye exhibits no discharge. Left eye exhibits no discharge. No scleral icterus.  Neck: Normal range of motion.  Cardiovascular: Normal rate, regular rhythm and normal heart sounds.   No murmur heard. Respiratory: Effort normal and breath sounds normal. No respiratory distress. She has no wheezes.  GI: Soft. Bowel sounds are normal. She exhibits no distension. There is no tenderness.  Genitourinary: Vagina normal and uterus normal. Cervix exhibits discharge (small amount of thin white discharge). Cervix exhibits no motion tenderness and no friability. Right adnexum displays no mass. Left adnexum displays no mass.  Cervix closed  Neurological: She is alert and oriented to person, place, and time.  Skin: Skin is warm and dry. She is not diaphoretic.  Psychiatric: She has a normal mood and affect. Her behavior is normal. Judgment and thought content normal.    MAU Course  Procedures Results for orders placed or performed during the hospital encounter of 07/11/15 (from the past 24 hour(s))  Urinalysis, Routine w reflex microscopic (not at [facility])     Status: None   Collection Time: 07/11/15 12:00 PM  Result Value Ref Range   Color, Urine YELLOW YELLOW   APPearance CLEAR CLEAR   Specific Gravity, Urine 1.010 1.005 - 1.030   pH 6.0  5.0 - 8.0   Glucose, UA NEGATIVE NEGATIVE mg/dL   Hgb urine dipstick NEGATIVE NEGATIVE   Bilirubin Urine NEGATIVE NEGATIVE   Ketones, ur NEGATIVE NEGATIVE mg/dL   Protein, ur NEGATIVE NEGATIVE mg/dL   Nitrite NEGATIVE NEGATIVE   Leukocytes, UA NEGATIVE NEGATIVE  Pregnancy, urine POC     Status: Abnormal   Collection Time: 07/11/15 12:10 PM  Result Value Ref Range   Preg Test, Ur POSITIVE (A) NEGATIVE  CBC     Status: Abnormal   Collection Time: 07/11/15 12:19 PM  Result Value Ref Range   WBC 2.9 (L) 4.0 - 10.5 K/uL   RBC 4.03 3.87 - 5.11 MIL/uL    Hemoglobin 12.1 12.0 - 15.0 g/dL   HCT 16.1 (L) 09.6 - 04.5 %   MCV 88.1 78.0 - 100.0 fL   MCH 30.0 26.0 - 34.0 pg   MCHC 34.1 30.0 - 36.0 g/dL   RDW 40.9 81.1 - 91.4 %   Platelets 308 150 - 400 K/uL  ABO/Rh     Status: None   Collection Time: 07/11/15 12:19 PM  Result Value Ref Range   ABO/RH(D) O POS   hCG, quantitative, pregnancy     Status: Abnormal   Collection Time: 07/11/15 12:20 PM  Result Value Ref Range   hCG, Beta Chain, Quant, S 120 (H) <5 mIU/mL  Wet prep, genital     Status: Abnormal   Collection Time: 07/11/15  1:35 PM  Result Value Ref Range   Yeast Wet Prep HPF POC NONE SEEN NONE SEEN   Trich, Wet Prep NONE SEEN NONE SEEN   Clue Cells Wet Prep HPF POC PRESENT (A) NONE SEEN   WBC, Wet Prep HPF POC FEW (A) NONE SEEN   Sperm NONE SEEN    US Ob Comp Less 14 Wks  07/11/2015  CLINICAL DATA:  Patient with abdominal cramping for 1 week. Positive pregnancy test. EXAM: OBSTETRIC <14 WK [place] AND TRANSVAGINAL OB US TECHNIQUE: Both transabdominal and transvaginal ultrasound examinations were performed for complete evaluation of the gestation as well as the maternal uterus, adnexal regions, and pelvic cul-de-sac. Transvaginal technique was performed to assess early pregnancy. COMPARISON:  Ob ultrasound 03/24/2012. FINDINGS: Intrauterine gestational sac: Not present Yolk sac:  Not present Embryo:  Not present Cardiac Activity: Not present Maternal uterus/adnexae: Small corpus luteum within the right ovary. Left ovary is normal. No free fluid in the pelvis. IMPRESSION: No intrauterine gestation is identified. In the setting of positive pregnancy test and no definite intrauterine pregnancy, this reflects a pregnancy of unknown location. Differential considerations include early normal IUP, abnormal IUP, or nonvisualized ectopic pregnancy. Differentiation is achieved with serial beta HCG supplemented by repeat sonography as clinically warranted. Electronically Signed   By: Annia Belt M.D.    On: 07/11/2015 14:30   US Ob Transvaginal  07/11/2015  CLINICAL DATA:  Patient with abdominal cramping for 1 week. Positive pregnancy test. EXAM: OBSTETRIC <14 WK [place] AND TRANSVAGINAL OB US TECHNIQUE: Both transabdominal and transvaginal ultrasound examinations were performed for complete evaluation of the gestation as well as the maternal uterus, adnexal regions, and pelvic cul-de-sac. Transvaginal technique was performed to assess early pregnancy. COMPARISON:  Ob ultrasound 03/24/2012. FINDINGS: Intrauterine gestational sac: Not present Yolk sac:  Not present Embryo:  Not present Cardiac Activity: Not present Maternal uterus/adnexae: Small corpus luteum within the right ovary. Left ovary is normal. No free fluid in the pelvis. IMPRESSION: No intrauterine gestation is identified. In the setting of positive pregnancy test  and no definite intrauterine pregnancy, this reflects a pregnancy of unknown location. Differential considerations include early normal IUP, abnormal IUP, or nonvisualized ectopic pregnancy. Differentiation is achieved with serial beta HCG supplemented by repeat sonography as clinically warranted. Electronically Signed   By: Annia Beltrew  Davis M.D.   On: 07/11/2015 14:30     MDM UPT positive O positive Ultrasound - no IUP Can't rule out ectopic pregnancy. Will have patient f/u in 2 days for BHCG Assessment and Plan  A: 1. Pregnancy of unknown anatomic location   2. Abdominal pain in pregnancy    P: Discharge home Ectopic precautions F/u in 2 days for BHCG Discussed reasons to return to MAU  Judeth HornErin Ericha Whittingham, NP  07/11/2015, 1:15 PM

## 2015-07-12 LAB — GC/CHLAMYDIA PROBE AMP (~~LOC~~) NOT AT ARMC
CHLAMYDIA, DNA PROBE: NEGATIVE
Neisseria Gonorrhea: NEGATIVE

## 2015-07-12 LAB — HIV ANTIBODY (ROUTINE TESTING W REFLEX): HIV Screen 4th Generation wRfx: NONREACTIVE

## 2015-07-26 ENCOUNTER — Inpatient Hospital Stay (EMERGENCY_DEPARTMENT_HOSPITAL)
Admission: AD | Admit: 2015-07-26 | Discharge: 2015-07-26 | Disposition: A | Discharge status: Home / Self Care | Payer: Medicaid Other | Source: Ambulatory Visit | Attending: Obstetrics | Admitting: Obstetrics

## 2015-07-26 ENCOUNTER — Encounter (HOSPITAL_COMMUNITY): Payer: Self-pay

## 2015-07-26 ENCOUNTER — Encounter (HOSPITAL_COMMUNITY): Payer: Self-pay | Admitting: *Deleted

## 2015-07-26 ENCOUNTER — Inpatient Hospital Stay (HOSPITAL_COMMUNITY)
Admission: AD | Admit: 2015-07-26 | Discharge: 2015-07-26 | Disposition: A | Discharge status: Home / Self Care | Payer: Medicaid Other | Source: Ambulatory Visit | Attending: Obstetrics | Admitting: Obstetrics

## 2015-07-26 ENCOUNTER — Inpatient Hospital Stay (HOSPITAL_COMMUNITY): Payer: Medicaid Other

## 2015-07-26 DIAGNOSIS — O26899 Other specified pregnancy related conditions, unspecified trimester: Secondary | ICD-10-CM

## 2015-07-26 DIAGNOSIS — O209 Hemorrhage in early pregnancy, unspecified: Secondary | ICD-10-CM

## 2015-07-26 DIAGNOSIS — R1030 Lower abdominal pain, unspecified: Secondary | ICD-10-CM | POA: Diagnosis not present

## 2015-07-26 DIAGNOSIS — O26851 Spotting complicating pregnancy, first trimester: Secondary | ICD-10-CM

## 2015-07-26 LAB — URINE MICROSCOPIC-ADD ON

## 2015-07-26 LAB — URINALYSIS, ROUTINE W REFLEX MICROSCOPIC
Bilirubin Urine: NEGATIVE
GLUCOSE, UA: NEGATIVE mg/dL
KETONES UR: NEGATIVE mg/dL
LEUKOCYTES UA: NEGATIVE
Nitrite: NEGATIVE
PROTEIN: 30 mg/dL — AB
Specific Gravity, Urine: >1.03 — ABNORMAL HIGH (ref 1.005–1.030)
pH: 6 (ref 5.0–8.0)

## 2015-07-26 LAB — CBC
HEMATOCRIT: 35.2 % — AB (ref 36.0–46.0)
HEMOGLOBIN: 11.9 g/dL — AB (ref 12.0–15.0)
MCH: 29.7 pg (ref 26.0–34.0)
MCHC: 33.8 g/dL (ref 30.0–36.0)
MCV: 87.8 fL (ref 78.0–100.0)
Platelets: 299 10*3/uL (ref 150–400)
RBC: 4.01 MIL/uL (ref 3.87–5.11)
RDW: 12.6 % (ref 11.5–15.5)
WBC: 4.1 10*3/uL (ref 4.0–10.5)

## 2015-07-26 LAB — HCG, QUANTITATIVE, PREGNANCY
hCG, Beta Chain, Quant, S: 389 m[IU]/mL — ABNORMAL HIGH (ref <5)
hCG, Beta Chain, Quant, S: 384 m[IU]/mL — ABNORMAL HIGH (ref <5)

## 2015-07-26 NOTE — MAU Provider Note (Signed)
History     CSN: [id number]  Arrival date and time: 07/26/15 1711   First Provider Initiated Contact with Patient 07/26/15 1815      Chief Complaint  Patient presents with  . Abdominal Pain  . Vaginal Bleeding   HPI  Pt is [redacted]w[redacted]d [id number] who returns to MAU with increase in vaginal bleeding and cramping.  Pt has seen Dr. Gaynell Face for her initial prenatal visit yesterday. This morning she came to MAU with mod amount of blood in her underwear and mild lower abdominal cramping.  An [place] was done today that showed IUGS at [redacted]w[redacted]d wihtout YS or FP.  Pt was initially seen in MAU on 07/11/2015 with HCG of 120 amd [place] without IUGS to return on Thanksgiving day for repeat HCG.  Pt states she went to her doctor (yesterday) instead. Pt states she called Dr. Gaynell Face today and he told her to come to MAU. Pt's blood type is O Positive   Past Medical History  Diagnosis Date  . No pertinent past medical history   . Preterm labor   . Infection     UTI  . Ovarian cyst   . Anemia     Past Surgical History  Procedure Laterality Date  . Cesarean section    . Induced abortion      Family History  Problem Relation Age of Onset  . Asthma Brother   . Asthma Daughter   . Cancer Paternal Grandmother     Social History  Substance Use Topics  . Smoking status: Never Smoker   . Smokeless tobacco: Never Used  . Alcohol Use: No    Allergies: No Known Allergies  Prescriptions prior to admission  Medication Sig Dispense Refill Last Dose  . acetaminophen (TYLENOL) 325 MG tablet Take 650 mg by mouth every 6 (six) hours as needed.   07/25/2015 at Unknown time    Review of Systems  Constitutional: Negative for fever and chills.  Gastrointestinal: Positive for abdominal pain. Negative for nausea, vomiting, diarrhea and constipation.  Genitourinary: Negative for dysuria.   Physical Exam   Blood pressure 127/75, pulse 74, temperature 98.2 F (36.8 C), resp. rate 16, last menstrual period 06/13/2015,  unknown if currently breastfeeding.  Physical Exam  Vitals reviewed. Constitutional: She is oriented to person, place, and time. She appears well-developed and well-nourished. No distress.  HENT:  Head: Normocephalic.  Eyes: Pupils are equal, round, and reactive to light.  Neck: Normal range of motion. Neck supple.  Cardiovascular: Normal rate.   Respiratory: Effort normal.  GI: Soft. She exhibits no distension. There is no tenderness.  Genitourinary:  Small amount of bright red blood in vault; cervix closed  Musculoskeletal: Normal range of motion.  Neurological: She is alert and oriented to person, place, and time.  Skin: Skin is warm and dry.  Psychiatric: She has a normal mood and affect.    MAU Course  Procedures Results for orders placed or performed during the hospital encounter of 07/26/15 (from the past 24 hour(s))  CBC     Status: Abnormal   Collection Time: 07/26/15  6:32 PM  Result Value Ref Range   WBC 4.1 4.0 - 10.5 K/uL   RBC 4.01 3.87 - 5.11 MIL/uL   Hemoglobin 11.9 (L) 12.0 - 15.0 g/dL   HCT 14.7 (L) 82.9 - 56.2 %   MCV 87.8 78.0 - 100.0 fL   MCH 29.7 26.0 - 34.0 pg   MCHC 33.8 30.0 - 36.0 g/dL   RDW 13.0 86.5 -  15.5 %   Platelets 299 150 - 400 K/uL  hCG, quantitative, pregnancy     Status: Abnormal   Collection Time: 07/26/15  6:32 PM  Result Value Ref Range   hCG, Beta Chain, Quant, S 384 (H) <5 mIU/mL   Results for Alma FriendlyMCCANTS, Lenay D (MRN [id number]) as of 07/26/2015 20:19  Ref. Range 07/26/2015 06:50 07/26/2015 07:50 07/26/2015 18:32  HCG, Beta Chain, Quant, S Latest Ref Range: <5 mIU/mL  389 (H) 384 (H)  [place] Ob Transvaginal  07/26/2015  CLINICAL DATA:  Vaginal bleeding and first-trimester pregnancy EXAM: TRANSVAGINAL OB ULTRASOUND TECHNIQUE: Transvaginal ultrasound was performed for complete evaluation of the gestation as well as the maternal uterus, adnexal regions, and pelvic cul-de-sac. COMPARISON:  07/11/2015 FINDINGS: Intrauterine gestational sac:  Likely present Yolk sac:  Not seen Embryo:  Not seen MSD: 4.9  mm   5 w   2  d Maternal uterus/adnexae: Physiologic appearance of the ovaries. No adnexal mass or free pelvic fluid. IMPRESSION: 1. Probable early intrauterine gestational sac, but no yolk sac or fetal pole. Recommend follow-up quantitative B-HCG levels and follow-up US in 14 days to confirm and assess viability. This recommendation follows SRU consensus guidelines: Diagnostic Criteria for Nonviable Pregnancy Early in the First Trimester. Malva Limes Engl J Med 2013; [id number]:9562-13; 369:1443-51. 2. Normal appearance of the adnexa. Electronically Signed   By: Marnee SpringJonathon  Watts M.D.   On: 07/26/2015 07:07   Discussed with Dr. Gaynell FaceMArshall- pt to repeat her HCG as scheduled on 06/28/2015 and then another HCG (2 more quants) Discussed with pt that she may continue to have bleeding and it may be heavy- there is nothing we can do to stop the bleeding- to return sooner if bleeding is heavier than a period Assessment and Plan  Bleeding in pregnancy Pregnancy of unknown anatomical location Repeat HCG on 06/28/2015 Return sooner for heavy bleeding or pain   Tom Ragsdale 07/26/2015, 6:36 PM

## 2015-07-26 NOTE — MAU Note (Signed)
Increase in bleeding and cramping.   Has saturated panti liners, no clots

## 2015-07-26 NOTE — MAU Note (Signed)
Pt reports she was seen in the office yesterday and had a pelvic exam and was told she may have some spotting but pt states when she got up this am there was blood on the sheets and she is having cramping.

## 2015-07-26 NOTE — MAU Provider Note (Signed)
History     CSN: [id number]  Arrival date and time: 07/26/15 [id number]   First Provider Initiated Contact with Patient 07/26/15 0729      No chief complaint on file.  HPI Maria Olson is a 24 y.o. [id number] at 6112w1d who presents to MAU today with complaint of vaginal bleeding. The patient was seen for her first prenatal appointment yesterday. She states that she was told she may have some bleeding, but woke up today with a moderate amount of blood in her underwear. She states a mild cramping lower abdominal pain today rated at 3/10. She has not taken anything for pain. She has has nausea without vomiting or diarrhea. She denies fever or UTI symptoms. She was seen first for this pregnancy on 07/11/15 and US showed no IUGS and quant hCG was 120. She did not return for her follow-up appointments.    OB History    Gravida Para Term Preterm AB TAB SAB Ectopic Multiple Living   6 3 2 1 2 2  0 0 0 3      Past Medical History  Diagnosis Date  . No pertinent past medical history   . Preterm labor   . Infection     UTI  . Ovarian cyst   . Anemia     Past Surgical History  Procedure Laterality Date  . Cesarean section    . Induced abortion      Family History  Problem Relation Age of Onset  . Asthma Brother   . Asthma Daughter   . Cancer Paternal Grandmother     Social History  Substance Use Topics  . Smoking status: Never Smoker   . Smokeless tobacco: Never Used  . Alcohol Use: No    Allergies: No Known Allergies  No prescriptions prior to admission    Review of Systems  Constitutional: Negative for fever and malaise/fatigue.  Gastrointestinal: Positive for nausea and abdominal pain. Negative for vomiting, diarrhea and constipation.  Genitourinary: Negative for dysuria, urgency and frequency.       + vaginal bleeding   Physical Exam   Blood pressure 119/70, pulse 71, temperature 98.1 F (36.7 C), temperature source Oral, resp. rate 16, height 5' 3.5" (1.613 m),  weight 184 lb (83.462 kg), last menstrual period 06/13/2015, SpO2 99 %, unknown if currently breastfeeding.  Physical Exam  Nursing note and vitals reviewed. Constitutional: She is oriented to person, place, and time. She appears well-developed and well-nourished. No distress.  HENT:  Head: Normocephalic and atraumatic.  Cardiovascular: Normal rate.   Respiratory: Effort normal.  GI: Soft. She exhibits no distension and no mass. There is no tenderness. There is no rebound and no guarding.  Genitourinary: Uterus is not enlarged and not tender. Cervix exhibits no motion tenderness, no discharge and no friability. There is bleeding (scant) in the vagina. Vaginal discharge (small amount of blood tinged discharge noted) found.  Neurological: She is alert and oriented to person, place, and time.  Skin: Skin is warm and dry. No erythema.  Psychiatric: She has a normal mood and affect.   Results for orders placed or performed during the hospital encounter of 07/26/15 (from the past 24 hour(s))  Urinalysis, Routine w reflex microscopic ([facility])     Status: Abnormal   Collection Time: 07/26/15  5:50 AM  Result Value Ref Range   Color, Urine YELLOW YELLOW   APPearance CLEAR CLEAR   Specific Gravity, Urine >1.030 (H) 1.005 - 1.030   pH 6.0 5.0 -  8.0   Glucose, UA NEGATIVE NEGATIVE mg/dL   Hgb urine dipstick LARGE (A) NEGATIVE   Bilirubin Urine NEGATIVE NEGATIVE   Ketones, ur NEGATIVE NEGATIVE mg/dL   Protein, ur 30 (A) NEGATIVE mg/dL   Nitrite NEGATIVE NEGATIVE   Leukocytes, UA NEGATIVE NEGATIVE  Urine microscopic-add on     Status: Abnormal   Collection Time: 07/26/15  5:50 AM  Result Value Ref Range   Squamous Epithelial / LPF 0-5 (A) NONE SEEN   WBC, UA 0-5 0 - 5 WBC/hpf   RBC / HPF 0-5 0 - 5 RBC/hpf   Bacteria, UA FEW (A) NONE SEEN   US Ob Transvaginal  07/26/2015  CLINICAL DATA:  Vaginal bleeding and first-trimester pregnancy EXAM: TRANSVAGINAL OB ULTRASOUND TECHNIQUE:  Transvaginal ultrasound was performed for complete evaluation of the gestation as well as the maternal uterus, adnexal regions, and pelvic cul-de-sac. COMPARISON:  07/11/2015 FINDINGS: Intrauterine gestational sac: Likely present Yolk sac:  Not seen Embryo:  Not seen MSD: 4.9  mm   5 w   2  d Maternal uterus/adnexae: Physiologic appearance of the ovaries. No adnexal mass or free pelvic fluid. IMPRESSION: 1. Probable early intrauterine gestational sac, but no yolk sac or fetal pole. Recommend follow-up quantitative B-HCG levels and follow-up US in 14 days to confirm and assess viability. This recommendation follows SRU consensus guidelines: Diagnostic Criteria for Nonviable Pregnancy Early in the First Trimester. Malva Limes Med 2013; 161:0960-45. 2. Normal appearance of the adnexa. Electronically Signed   By: Marnee Spring M.D.   On: 07/26/2015 07:07    MAU Course  Procedures None  MDM [place] ordered today. Since patient didn't follow-up as instructed it is difficult to tell if development is normal. There has been some development since last [place].  Ordered quant hCG for baseline to be drawn prior to discharge today. Patient will need to return in 2 days for follow-up. Encouraged importance of follow-up.   Assessment and Plan  A: IUGS at [redacted]w[redacted]d without YS or FP Vaginal bleeding in pregnancy Abdominal pain in pregnancy  P: Discharge home Tylenol PRN for pain advised Bleeding/ectopic precautions discussed Patient advised to follow-up in MAU in 48 hours for repeat labs or sooner if her condition were to change or worsen   Marny Lowenstein, PA-C  07/26/2015, 7:30 AM

## 2015-07-26 NOTE — Discharge Instructions (Signed)

## 2015-07-28 ENCOUNTER — Inpatient Hospital Stay (HOSPITAL_COMMUNITY)
Admission: AD | Admit: 2015-07-28 | Discharge: 2015-07-28 | Disposition: A | Discharge status: Home / Self Care | Payer: Medicaid Other | Source: Ambulatory Visit | Attending: Obstetrics & Gynecology | Admitting: Obstetrics & Gynecology

## 2015-07-28 DIAGNOSIS — O039 Complete or unspecified spontaneous abortion without complication: Secondary | ICD-10-CM | POA: Insufficient documentation

## 2015-07-28 DIAGNOSIS — O209 Hemorrhage in early pregnancy, unspecified: Secondary | ICD-10-CM | POA: Diagnosis present

## 2015-07-28 LAB — HCG, QUANTITATIVE, PREGNANCY: HCG, BETA CHAIN, QUANT, S: 134 m[IU]/mL — AB (ref <5)

## 2015-07-28 NOTE — Discharge Instructions (Signed)
Incomplete Miscarriage A miscarriage is the sudden loss of an unborn baby (fetus) before the 20th week of pregnancy. In an incomplete miscarriage, parts of the fetus or placenta (afterbirth) remain in the body.  Having a miscarriage can be an emotional experience. Talk with your health care provider about any questions you may have about miscarrying, the grieving process, and your future pregnancy plans. CAUSES   Problems with the fetal chromosomes that make it impossible for the baby to develop normally. Problems with the baby's genes or chromosomes are most often the result of errors that occur by chance as the embryo divides and grows. The problems are not inherited from the parents.  Infection of the cervix or uterus.  Hormone problems.  Problems with the cervix, such as having an incompetent cervix. This is when the tissue in the cervix is not strong enough to hold the pregnancy.  Problems with the uterus, such as an abnormally shaped uterus, uterine fibroids, or congenital abnormalities.  Certain medical conditions.  Smoking, drinking alcohol, or taking illegal drugs.  Trauma. SYMPTOMS   Vaginal bleeding or spotting, with or without cramps or pain.  Pain or cramping in the abdomen or lower back.  Passing fluid, tissue, or blood clots from the vagina. DIAGNOSIS  Your health care provider will perform a physical exam. You may also have an ultrasound to confirm the miscarriage. Blood or urine tests may also be ordered. TREATMENT   Usually, a dilation and curettage (D&C) procedure is performed. During a D&C procedure, the cervix is widened (dilated) and any remaining fetal or placental tissue is gently removed from the uterus.  Antibiotic medicines are prescribed if there is an infection. Other medicines may be given to reduce the size of the uterus (contract) if there is a lot of bleeding.  If you have Rh negative blood and your baby was Rh positive, you will need a Rho (D)  immune globulin shot. This shot will protect any future baby from having Rh blood problems in future pregnancies.  You may be confined to bed rest. This means you should stay in bed and only get up to use the bathroom. HOME CARE INSTRUCTIONS   Rest as directed by your health care provider.  Restrict activity as directed by your health care provider. You may be allowed to continue light activity if curettage was not done but you require further treatment.  Keep track of the number of pads you use each day. Keep track of how soaked (saturated) they are. Record this information.  Do not  use tampons.  Do not douche or have sexual intercourse until approved by your health care provider.  Keep all follow-up appointments for reevaluation and continuing management.  Only take over-the-counter or prescription medicines for pain, fever, or discomfort as directed by your health care provider.  Take antibiotic medicine as directed by your health care provider. Make sure you finish it even if you start to feel better. SEEK IMMEDIATE MEDICAL CARE IF:   You experience severe cramps in your stomach, back, or abdomen.  You have an unexplained temperature (make sure to record these temperatures).  You pass large clots or tissue (save these for your health care provider to inspect).  Your bleeding increases.  You become light-headed, weak, or have fainting episodes. MAKE SURE YOU:   Understand these instructions.  Will watch your condition.  Will get help right away if you are not doing well or get worse.   This information is not intended to   replace advice given to you by your health care provider. Make sure you discuss any questions you have with your health care provider.   Document Released: 08/05/2005 Document Revised: 08/26/2014 Document Reviewed: 03/04/2013 Elsevier Interactive Patient Education 2016 Elsevier Inc.  

## 2015-07-28 NOTE — MAU Note (Signed)
Patient here for repeat BHCG, denies pain, having light vaginal bleeding.

## 2015-07-28 NOTE — MAU Provider Note (Signed)
Ms. Maria Olson  is a 24 y.o. [id number] at 7950w3d who presents to MAU today for follow-up quant hCG after 48 hours. The patient was seen 07/26/15 with new onset vaginal bleeding. She had originally been seen on 07/11/15 and had US without IUGS, YS or FP seen and quant hCG was 120. She did not follow-up as instructed for repeat quant hCG after that. When she returned to MAU with vaginal bleeding on 07/26/15 US showed IUGS without YS or FP. Quant hCG was 389. She was advised to return today for repeat labs. She states continued light bleeding off and on. She also states occasional cramping relieved with Tylenol. She denies fever today.   BP 113/70 mmHg  Pulse 73  Temp(Src) 98 F (36.7 C)  LMP 06/13/2015  CONSTITUTIONAL: Well-developed, well-nourished female in no acute distress.  ENT: External right and left ear normal.  EYES: EOM intact, conjunctivae normal.  MUSCULOSKELETAL: Normal range of motion.  CARDIOVASCULAR: Regular heart rate RESPIRATORY: Normal effort NEUROLOGICAL: Alert and oriented to person, place, and time.  SKIN: Skin is warm and dry. No rash noted. Not diaphoretic. No erythema. No pallor. PSYCH: Normal mood and affect. Normal behavior. Normal judgment and thought content.  Results for Maria FriendlyMCCANTS, Maria D (MRN [id number]) as of 07/28/2015 11:28  Ref. Range 07/26/2015 07:50 07/26/2015 18:32 07/28/2015 10:32  HCG, Beta Chain, Quant, S Latest Ref Range: <5 mIU/mL 389 (H) 384 (H) 134 (H)   MDM Significant drop in hCG indicative of SAB in progress. Since hCG is very low we would expect process will complete naturally.  Warning signs for infection and retained POC discussed. Patient to follow-up with Dr. Gaynell FaceMarshall in ~ 2 weeks.  Comfort care given  A: SAB  P: Discharge home Bleeding precautions discussed Patient advised to call Dr. Elsie StainMarshall's office and make an appointment for follow-up in ~ 2 weeks Patient may return to MAU as needed or if her condition were to change or worsen    Marny LowensteinJulie N Chanta Bauers, PA-C 07/28/2015 11:35 AM

## 2015-10-16 ENCOUNTER — Inpatient Hospital Stay (HOSPITAL_COMMUNITY)
Admission: AD | Admit: 2015-10-16 | Discharge: 2015-10-16 | Disposition: A | Discharge status: Home / Self Care | Payer: Medicaid Other | Source: Ambulatory Visit | Attending: Obstetrics & Gynecology | Admitting: Obstetrics & Gynecology

## 2015-10-16 ENCOUNTER — Encounter (HOSPITAL_COMMUNITY): Payer: Self-pay | Admitting: Family

## 2015-10-16 DIAGNOSIS — R102 Pelvic and perineal pain: Secondary | ICD-10-CM | POA: Diagnosis not present

## 2015-10-16 DIAGNOSIS — Z3200 Encounter for pregnancy test, result unknown: Secondary | ICD-10-CM | POA: Diagnosis present

## 2015-10-16 DIAGNOSIS — Z3202 Encounter for pregnancy test, result negative: Secondary | ICD-10-CM | POA: Diagnosis not present

## 2015-10-16 LAB — HCG, SERUM, QUALITATIVE: PREG SERUM: NEGATIVE

## 2015-10-16 LAB — URINE MICROSCOPIC-ADD ON

## 2015-10-16 LAB — URINALYSIS, ROUTINE W REFLEX MICROSCOPIC
Bilirubin Urine: NEGATIVE
Glucose, UA: NEGATIVE mg/dL
Ketones, ur: 15 mg/dL — AB
Leukocytes, UA: NEGATIVE
NITRITE: NEGATIVE
PROTEIN: NEGATIVE mg/dL
Specific Gravity, Urine: >1.03 — ABNORMAL HIGH (ref 1.005–1.030)
pH: 5.5 (ref 5.0–8.0)

## 2015-10-16 NOTE — MAU Note (Signed)
Pt reports she had a miscarriage in December and had a positive preg test 4 days ago. States she is spotting.off/on cramping.

## 2015-10-16 NOTE — Discharge Instructions (Signed)
Pregnancy Test Information °WHAT IS A PREGNANCY TEST? °A pregnancy test is used to detect the presence of human chorionic gonadotropin (hCG) in a sample of your urine or blood. hCG is a hormone produced by the cells of the placenta. The placenta is the organ that forms to nourish and support a developing baby. °This test requires a sample of either blood or urine. A pregnancy test determines whether you are pregnant or not. °HOW ARE PREGNANCY TESTS DONE? °Pregnancy tests are done using a home pregnancy test or having a blood or urine test done at your health care provider's office.  °Home pregnancy tests require a urine sample. °· Most kits use a plastic testing device with a strip of paper that indicates whether there is hCG in your urine. °· Follow the test instructions very carefully. °· After you urinate on the test stick, markings will appear to let you know whether you are pregnant. °· For best results, use your first urine of the morning. That is when the concentration of hCG is highest. °Having a blood test to check for pregnancy requires a sample of blood drawn from a vein in your hand or arm. Your health care provider will send your sample to a lab for testing. Results of a pregnancy test will be positive or negative. °IS ONE TYPE OF PREGNANCY TEST BETTER THAN ANOTHER? °In some cases, a blood test will return a positive result even if a urine test was negative because blood tests are more sensitive. This means blood tests can detect hCG earlier than home pregnancy tests.  °HOW ACCURATE ARE HOME PREGNANCY TESTS?  °Both types of pregnancy tests are very accurate. °· A blood test is about 98% accurate. °· When you are far enough along in your pregnancy and when used correctly, home pregnancy tests are equally accurate. °CAN ANYTHING INTERFERE WITH HOME PREGNANCY TEST RESULTS?  °It is possible for certain conditions to cause an inaccurate test result (false positive or false negative). °· A false positive is a  positive test result when you are not pregnant. This can happen if you: °¨ Are taking certain medicines, including anticonvulsants or tranquilizers. °¨ Have certain proteins in your blood. °· A false negative is a negative test result when you are pregnant. This can happen if you: °¨ Took the test before there was enough hCG to detect. A pregnancy test will not be positive in most women until 3-4 weeks after conception. °¨ Drank a lot of liquid before the test. Diluted urine samples can sometimes give an inaccurate result. °¨ Take certain medicines, such as water pills (diuretics) or some antihistamines. °WHAT SHOULD I DO IF I HAVE A POSITIVE PREGNANCY TEST? °If you have a positive pregnancy test, schedule an appointment with your health care provider. You might need additional testing to confirm the pregnancy. In the meantime, begin taking a prenatal vitamin, stop smoking, stop drinking alcohol, and do not use street drugs. °Talk to your health care provider about how to take care of yourself during your pregnancy. Ask about what to expect from the care you will need throughout pregnancy (prenatal care). °  °This information is not intended to replace advice given to you by your health care provider. Make sure you discuss any questions you have with your health care provider. °  °Document Released: 08/08/2003 Document Revised: 08/26/2014 Document Reviewed: 11/30/2013 °Elsevier Interactive Patient Education ©2016 Elsevier Inc. ° °

## 2015-10-16 NOTE — MAU Note (Signed)
Pt taken to room 8 after triage but pt states she does not want to get undressed and she does not want exam. Discuss with  Pt the need for exam due to lower abd pain at times and ? Pregnant with spotting. Pt states she does not want the exam just the hormone level. Pt taken back to triage room for provider to speak to her.

## 2015-10-16 NOTE — MAU Provider Note (Signed)
History   [id number]   No chief complaint on file.   HPI Maria Olson is a 25 y.o. female  [phone] here with report of positive pregnancy test at home and pelvic cramping.  Pt declines to get pelvic exam or any additional testing.  Miscarriage occurred this past December 2016.     Patient's last menstrual period was 09/19/2015.  OB History  Gravida Para Term Preterm AB SAB TAB Ectopic Multiple Living  0 2 0 0 3    # Outcome Date GA Lbr Len/2nd Weight Sex Delivery Anes PTL Lv  6 Gravida           5 Term 03/02/14 [redacted]w[redacted]d 18:50 / 00:48 6 lb 12.3 oz (3.07 kg) F VBAC EPI  Y  4 TAB 2014          3 TAB 2014          2 Preterm 03/27/12 [redacted]w[redacted]d   M CS-LTranv Spinal Y Y     Comments: gastroschisis  1 Term 04/23/11 [redacted]w[redacted]d 10:26 / 01:14 7 lb 2.8 oz (3.255 kg) F Vag-Spont EPI  Y     Comments: None      Past Medical History  Diagnosis Date  . No pertinent past medical history   . Preterm labor   . Infection     UTI  . Ovarian cyst   . Anemia     Family History  Problem Relation Age of Onset  . Asthma Brother   . Asthma Daughter   . Cancer Paternal Grandmother     Social History   Social History  . Marital Status: Single    Spouse Name: N/A  . Number of Children: N/A  . Years of Education: N/A   Social History Main Topics  . Smoking status: Never Smoker   . Smokeless tobacco: Never Used  . Alcohol Use: No  . Drug Use: No  . Sexual Activity: Yes    Birth Control/ Protection: Inserts   Other Topics Concern  . Not on file   Social History Narrative    No Known Allergies  No current facility-administered medications on file prior to encounter.   Current Outpatient Prescriptions on File Prior to Encounter  Medication Sig Dispense Refill  . acetaminophen (TYLENOL) 325 MG tablet Take 650 mg by mouth every 6 (six) hours as needed.       Review of Systems  Constitutional: Negative for fever and chills.  Gastrointestinal: Negative for nausea and  vomiting.  Genitourinary: Positive for pelvic pain. Negative for dysuria, frequency and vaginal bleeding.  All other systems reviewed and are negative.    Physical Exam   Filed Vitals:   10/16/15 0041  BP: 121/77  Pulse: 76  Temp: 98.3 F (36.8 C)  TempSrc: Oral  Resp: 16  SpO2: 100%    Physical Exam  Constitutional: She is oriented to person, place, and time. She appears well-developed and well-nourished. No distress.  HENT:  Head: Normocephalic.  Neck: Neck supple.  Respiratory: Effort normal and breath sounds normal.  Neurological: She is alert and oriented to person, place, and time. She has normal reflexes.  Skin: Skin is warm and dry.  Psychiatric: She has a normal mood and affect.    MAU Course  Procedures  Results for orders placed or performed during the hospital encounter of 10/16/15 (from the past 24 hour(s))  Urinalysis, Routine w reflex microscopic (not at [facility])     Status: Abnormal   Collection  Time: 10/16/15 12:20 AM  Result Value Ref Range   Color, Urine YELLOW YELLOW   APPearance CLEAR CLEAR   Specific Gravity, Urine >1.030 (H) 1.005 - 1.030   pH 5.5 5.0 - 8.0   Glucose, UA NEGATIVE NEGATIVE mg/dL   Hgb urine dipstick LARGE (A) NEGATIVE   Bilirubin Urine NEGATIVE NEGATIVE   Ketones, ur 15 (A) NEGATIVE mg/dL   Protein, ur NEGATIVE NEGATIVE mg/dL   Nitrite NEGATIVE NEGATIVE   Leukocytes, UA NEGATIVE NEGATIVE  Urine microscopic-add on     Status: Abnormal   Collection Time: 10/16/15 12:20 AM  Result Value Ref Range   Squamous Epithelial / LPF 0-5 (A) NONE SEEN   WBC, UA 0-5 0 - 5 WBC/hpf   RBC / HPF 0-5 0 - 5 RBC/hpf   Bacteria, UA FEW (A) NONE SEEN   Crystals CA OXALATE CRYSTALS (A) NEGATIVE   Urine-Other MUCOUS PRESENT   hCG, serum, qualitative     Status: None   Collection Time: 10/16/15  1:32 AM  Result Value Ref Range   Preg, Serum NEGATIVE NEGATIVE     Assessment and Plan  Negative Pregnancy Test  Plan: Discharge home Explained  likely menstruation is about to start  Marlis Edelson, CNM 10/16/2015 2:13 AM

## 2015-11-14 ENCOUNTER — Encounter (HOSPITAL_COMMUNITY): Payer: Self-pay | Admitting: *Deleted

## 2015-11-14 ENCOUNTER — Inpatient Hospital Stay (HOSPITAL_COMMUNITY): Payer: Medicaid Other

## 2015-11-14 ENCOUNTER — Inpatient Hospital Stay (HOSPITAL_COMMUNITY)
Admission: AD | Admit: 2015-11-14 | Discharge: 2015-11-14 | Disposition: A | Discharge status: Home / Self Care | Payer: Medicaid Other | Source: Ambulatory Visit | Attending: Family Medicine | Admitting: Family Medicine

## 2015-11-14 DIAGNOSIS — O26891 Other specified pregnancy related conditions, first trimester: Secondary | ICD-10-CM | POA: Insufficient documentation

## 2015-11-14 DIAGNOSIS — R109 Unspecified abdominal pain: Secondary | ICD-10-CM | POA: Insufficient documentation

## 2015-11-14 DIAGNOSIS — O23591 Infection of other part of genital tract in pregnancy, first trimester: Secondary | ICD-10-CM | POA: Insufficient documentation

## 2015-11-14 DIAGNOSIS — B9689 Other specified bacterial agents as the cause of diseases classified elsewhere: Secondary | ICD-10-CM | POA: Diagnosis not present

## 2015-11-14 DIAGNOSIS — O26899 Other specified pregnancy related conditions, unspecified trimester: Secondary | ICD-10-CM

## 2015-11-14 DIAGNOSIS — N760 Acute vaginitis: Secondary | ICD-10-CM | POA: Diagnosis not present

## 2015-11-14 DIAGNOSIS — Z3A01 Less than 8 weeks gestation of pregnancy: Secondary | ICD-10-CM | POA: Insufficient documentation

## 2015-11-14 LAB — WET PREP, GENITAL
Sperm: NONE SEEN
TRICH WET PREP: NONE SEEN
Yeast Wet Prep HPF POC: NONE SEEN

## 2015-11-14 LAB — CBC
HCT: 37.5 % (ref 36.0–46.0)
Hemoglobin: 12.9 g/dL (ref 12.0–15.0)
MCH: 30.1 pg (ref 26.0–34.0)
MCHC: 34.4 g/dL (ref 30.0–36.0)
MCV: 87.6 fL (ref 78.0–100.0)
PLATELETS: 297 10*3/uL (ref 150–400)
RBC: 4.28 MIL/uL (ref 3.87–5.11)
RDW: 12.6 % (ref 11.5–15.5)
WBC: 4.3 10*3/uL (ref 4.0–10.5)

## 2015-11-14 LAB — URINE MICROSCOPIC-ADD ON

## 2015-11-14 LAB — URINALYSIS, ROUTINE W REFLEX MICROSCOPIC
BILIRUBIN URINE: NEGATIVE
Glucose, UA: NEGATIVE mg/dL
Hgb urine dipstick: NEGATIVE
KETONES UR: NEGATIVE mg/dL
NITRITE: NEGATIVE
Protein, ur: NEGATIVE mg/dL
Specific Gravity, Urine: 1.02 (ref 1.005–1.030)
pH: 6 (ref 5.0–8.0)

## 2015-11-14 LAB — POCT PREGNANCY, URINE: PREG TEST UR: POSITIVE — AB

## 2015-11-14 LAB — HCG, QUANTITATIVE, PREGNANCY: HCG, BETA CHAIN, QUANT, S: 2477 m[IU]/mL — AB (ref <5)

## 2015-11-14 LAB — OB RESULTS CONSOLE GC/CHLAMYDIA: Gonorrhea: NEGATIVE

## 2015-11-14 MED ORDER — METRONIDAZOLE 500 MG PO TABS: 500.0000 mg | ORAL_TABLET | Freq: Two times a day (BID) | ORAL | Status: DC

## 2015-11-14 MED ORDER — PRENATAL COMPLETE 14-0.4 MG PO TABS: 1.0000 | ORAL_TABLET | ORAL | Status: DC

## 2015-11-14 NOTE — MAU Note (Signed)
Took a preg test today - was pos, had SAB in Dec.  +test last month, but blood test was neg.  Wants to see if she really is and if everything is ok

## 2015-11-14 NOTE — MAU Provider Note (Signed)
History     CSN: [id number]  Arrival date and time: 11/14/15 1629   None     Chief Complaint  Patient presents with  . Possible Pregnancy  . Abdominal Pain  . Vaginal Discharge   HPIpt is [redacted]w[redacted]d pregnant ?LMP [id number] with last pregnancy SAB.  Pt also states that she has a vaginal discharge. Pt concerned about pregnancy. RN note:      Expand All Collapse All   Took a preg test today - was pos, had SAB in Dec. +test last month, but blood test was neg. Wants to see if she really is and if everything is ok       Past Medical History  Diagnosis Date  . No pertinent past medical history   . Preterm labor   . Infection     UTI  . Ovarian cyst   . Anemia     Past Surgical History  Procedure Laterality Date  . Cesarean section    . Induced abortion      Family History  Problem Relation Age of Onset  . Asthma Brother   . Asthma Daughter   . Cancer Paternal Grandmother     Social History  Substance Use Topics  . Smoking status: Never Smoker   . Smokeless tobacco: Never Used  . Alcohol Use: No    Allergies: No Known Allergies  Prescriptions prior to admission  Medication Sig Dispense Refill Last Dose  . acetaminophen (TYLENOL) 325 MG tablet Take 650 mg by mouth every 6 (six) hours as needed.   07/25/2015 at Unknown time    Review of Systems  Constitutional: Negative for fever and chills.  Gastrointestinal: Negative for nausea, vomiting, abdominal pain and diarrhea.  Genitourinary: Negative for dysuria and urgency.  Neurological: Negative for dizziness and headaches.   Physical Exam   Blood pressure 120/77, pulse 76, temperature 98.4 F (36.9 C), temperature source Oral, resp. rate 16, height 5' 2.5" (1.588 m), weight 175 lb 3.2 oz (79.47 kg), last menstrual period 10/16/2015, unknown if currently breastfeeding.  Physical Exam  Nursing note and vitals reviewed. Constitutional: She is oriented to person, place, and time. She appears well-developed and  well-nourished. No distress.  HENT:  Head: Normocephalic.  Eyes: Pupils are equal, round, and reactive to light.  Neck: Normal range of motion. Neck supple.  Cardiovascular: Normal rate.   Respiratory: Effort normal.  GI: Soft. She exhibits no distension. There is no tenderness. There is no rebound.  Genitourinary:  Small amount of frothy clear discharge in vault; no blood noted; cervix closed uterus NSSC/ ?ULN- adnexa without palpable enlargement or tenderness  Musculoskeletal: Normal range of motion.  Neurological: She is alert and oriented to person, place, and time.  Skin: Skin is warm and dry.  Psychiatric: She has a normal mood and affect.    MAU Course  Procedures Results for orders placed or performed during the hospital encounter of 11/14/15 (from the past 24 hour(s))  Urinalysis, Routine w reflex microscopic (not at [facility])     Status: Abnormal   Collection Time: 11/14/15  5:35 PM  Result Value Ref Range   Color, Urine YELLOW YELLOW   APPearance HAZY (A) CLEAR   Specific Gravity, Urine 1.020 1.005 - 1.030   pH 6.0 5.0 - 8.0   Glucose, UA NEGATIVE NEGATIVE mg/dL   Hgb urine dipstick NEGATIVE NEGATIVE   Bilirubin Urine NEGATIVE NEGATIVE   Ketones, ur NEGATIVE NEGATIVE mg/dL   Protein, ur NEGATIVE NEGATIVE mg/dL   Nitrite NEGATIVE  NEGATIVE   Leukocytes, UA TRACE (A) NEGATIVE  Urine microscopic-add on     Status: Abnormal   Collection Time: 11/14/15  5:35 PM  Result Value Ref Range   Squamous Epithelial / LPF 0-5 (A) NONE SEEN   WBC, UA 0-5 0 - 5 WBC/hpf   RBC / HPF 0-5 0 - 5 RBC/hpf   Bacteria, UA RARE (A) NONE SEEN  Pregnancy, urine POC     Status: Abnormal   Collection Time: 11/14/15  5:40 PM  Result Value Ref Range   Preg Test, Ur POSITIVE (A) NEGATIVE  Wet prep, genital     Status: Abnormal   Collection Time: 11/14/15  6:00 PM  Result Value Ref Range   Yeast Wet Prep HPF POC NONE SEEN NONE SEEN   Trich, Wet Prep NONE SEEN NONE SEEN   Clue Cells Wet Prep HPF  POC PRESENT (A) NONE SEEN   WBC, Wet Prep HPF POC FEW (A) NONE SEEN   Sperm NONE SEEN   CBC     Status: None   Collection Time: 11/14/15  6:16 PM  Result Value Ref Range   WBC 4.3 4.0 - 10.5 K/uL   RBC 4.28 3.87 - 5.11 MIL/uL   Hemoglobin 12.9 12.0 - 15.0 g/dL   HCT 82.937.5 56.236.0 - 13.046.0 %   MCV 87.6 78.0 - 100.0 fL   MCH 30.1 26.0 - 34.0 pg   MCHC 34.4 30.0 - 36.0 g/dL   RDW 86.512.6 78.411.5 - 69.615.5 %   Platelets 297 150 - 400 K/uL  hCG, quantitative, pregnancy     Status: Abnormal   Collection Time: 11/14/15  6:16 PM  Result Value Ref Range   hCG, Beta Chain, Quant, S 2477 (H) <5 mIU/mL  [place] Ob Comp Less 14 Wks  11/14/2015  CLINICAL DATA:  25 year old pregnant female presenting with pelvic pain for 2 days. Pending quantitative beta HCG. EDC by estimated LMP: 07/22/2016, projecting to an expected gestational age of [redacted] weeks 1 day. EXAM: OBSTETRIC <14 WK US AND TRANSVAGINAL OB US TECHNIQUE: Both transabdominal and transvaginal ultrasound examinations were performed for complete evaluation of the gestation as well as the maternal uterus, adnexal regions, and pelvic cul-de-sac. Transvaginal technique was performed to assess early pregnancy. COMPARISON:  No prior scans from this gestation. FINDINGS: The anteverted anteflexed uterus measures 9.4 x 5.5 by 5.3 cm and appears normal in size and configuration. Apparent normal Cesarean scar in the anterior lower uterine segment. No uterine fibroids or other myometrial abnormality. Nonspecific tiny 0.4 x 0.2 x 0.3 cm cystic structure in the endometrial cavity, without internal yolk sac, embryo or embryonic cardiac activity. If this represents an intrauterine gestational sac, the gestational age is 5 weeks 0 days by mean sac diameter. No endometrial cavity fluid or additional focal endometrial findings. Right ovary measures 2.2 x 1.6 x 1.7 cm. Left ovary is seen only on the transabdominal images and measures 4.3 x 2.1 x 2.9 cm and appears to contain a normal corpus  luteum. No suspicious ovarian or adnexal findings. No abnormal free fluid in the pelvis. IMPRESSION: 1. No definitive localization of the pregnancy at ultrasound. Nonspecific tiny 0.3 cm cystic structure in the endometrial cavity could represent an early intrauterine gestation, although there are no definitive markers of pregnancy at this time such as a yolk sac, embryo or embryonic cardiac activity. If this endometrial cavity cystic structure represents an intrauterine gestational sac, the gestational age is 5 weeks 0 days by mean sac diameter. 2. No  suspicious ovarian or adnexal findings. An occult ectopic pregnancy cannot be excluded on the basis of this study. 3. Recommend close clinical follow-up and serial quantitative beta HCG monitoring, with follow-up obstetric scan in 3-4 weeks, or earlier as clinically warranted. Electronically Signed   By: Delbert Phenix M.D.   On: 11/14/2015 19:31   US Ob Transvaginal  11/14/2015  CLINICAL DATA:  25 year old pregnant female presenting with pelvic pain for 2 days. Pending quantitative beta HCG. EDC by estimated LMP: 07/22/2016, projecting to an expected gestational age of [redacted] weeks 1 day. EXAM: OBSTETRIC <14 WK [place] AND TRANSVAGINAL OB US TECHNIQUE: Both transabdominal and transvaginal ultrasound examinations were performed for complete evaluation of the gestation as well as the maternal uterus, adnexal regions, and pelvic cul-de-sac. Transvaginal technique was performed to assess early pregnancy. COMPARISON:  No prior scans from this gestation. FINDINGS: The anteverted anteflexed uterus measures 9.4 x 5.5 by 5.3 cm and appears normal in size and configuration. Apparent normal Cesarean scar in the anterior lower uterine segment. No uterine fibroids or other myometrial abnormality. Nonspecific tiny 0.4 x 0.2 x 0.3 cm cystic structure in the endometrial cavity, without internal yolk sac, embryo or embryonic cardiac activity. If this represents an intrauterine gestational sac,  the gestational age is 5 weeks 0 days by mean sac diameter. No endometrial cavity fluid or additional focal endometrial findings. Right ovary measures 2.2 x 1.6 x 1.7 cm. Left ovary is seen only on the transabdominal images and measures 4.3 x 2.1 x 2.9 cm and appears to contain a normal corpus luteum. No suspicious ovarian or adnexal findings. No abnormal free fluid in the pelvis. IMPRESSION: 1. No definitive localization of the pregnancy at ultrasound. Nonspecific tiny 0.3 cm cystic structure in the endometrial cavity could represent an early intrauterine gestation, although there are no definitive markers of pregnancy at this time such as a yolk sac, embryo or embryonic cardiac activity. If this endometrial cavity cystic structure represents an intrauterine gestational sac, the gestational age is 5 weeks 0 days by mean sac diameter. 2. No suspicious ovarian or adnexal findings. An occult ectopic pregnancy cannot be excluded on the basis of this study. 3. Recommend close clinical follow-up and serial quantitative beta HCG monitoring, with follow-up obstetric scan in 3-4 weeks, or earlier as clinically warranted. Electronically Signed   By: Delbert Phenix M.D.   On: 11/14/2015 19:31     Assessment and Plan  Abdominal pain in early pregnancy- repeat HCG in 2 days in clinic Bacterial vaginosis- Flagyl  BID for 7 days Prenatal plus vitamin  Kinzy Weyers 11/14/2015, 5:53 PM

## 2015-11-15 LAB — GC/CHLAMYDIA PROBE AMP (~~LOC~~) NOT AT ARMC
Chlamydia: NEGATIVE
Neisseria Gonorrhea: NEGATIVE

## 2015-11-15 LAB — HIV ANTIBODY (ROUTINE TESTING W REFLEX): HIV Screen 4th Generation wRfx: NONREACTIVE

## 2015-11-17 ENCOUNTER — Ambulatory Visit: Payer: Self-pay | Admitting: Obstetrics & Gynecology

## 2015-11-17 ENCOUNTER — Telehealth: Payer: Self-pay

## 2015-11-17 ENCOUNTER — Inpatient Hospital Stay (HOSPITAL_COMMUNITY)
Admission: AD | Admit: 2015-11-17 | Discharge: 2015-11-17 | Disposition: A | Discharge status: Home / Self Care | Payer: Medicaid Other | Source: Ambulatory Visit | Attending: Obstetrics and Gynecology | Admitting: Obstetrics and Gynecology

## 2015-11-17 DIAGNOSIS — R109 Unspecified abdominal pain: Secondary | ICD-10-CM

## 2015-11-17 DIAGNOSIS — Z3A01 Less than 8 weeks gestation of pregnancy: Secondary | ICD-10-CM | POA: Insufficient documentation

## 2015-11-17 DIAGNOSIS — O26891 Other specified pregnancy related conditions, first trimester: Secondary | ICD-10-CM | POA: Insufficient documentation

## 2015-11-17 DIAGNOSIS — O26899 Other specified pregnancy related conditions, unspecified trimester: Secondary | ICD-10-CM

## 2015-11-17 DIAGNOSIS — O9989 Other specified diseases and conditions complicating pregnancy, childbirth and the puerperium: Secondary | ICD-10-CM

## 2015-11-17 LAB — HCG, QUANTITATIVE, PREGNANCY: HCG, BETA CHAIN, QUANT, S: 10469 m[IU]/mL — AB (ref <5)

## 2015-11-17 NOTE — Telephone Encounter (Signed)
Called pt in regards to missed STAT beta appt.  Pt stated that she had to go to work so that is why she missed the appt.  I advised pt to go to MAU after she gets off work today and inform them that she missed her appt today at the Clinics for the STAT beta lab.  Pt stated understanding with no further questions.

## 2015-11-17 NOTE — MAU Provider Note (Signed)
Ms. Maria Olson  is a 25 y.o. [id number] at 3570w4d who presents to the MAU today for follow-up quant hCG after 48 hours. The patient was seen in MAU on 11/14/15 and had quant hCG of 2477 and US showed possible small gestational sac, pregnancy of unknown location. She denies abdominal pain, vaginal bleeding or fever today.   OB History  Gravida Para Term Preterm AB SAB TAB Ectopic Multiple Living  7 3 2 1 2  0 2 0 0 3    # Outcome Date GA Lbr Len/2nd Weight Sex Delivery Anes PTL Lv  7 Current           6 Term 03/02/14 674w4d 18:50 / 00:48 6 lb 12.3 oz (3.07 kg) F VBAC EPI  Y  5 TAB 2014          4 TAB 2014          3 Preterm 03/27/12 3457w0d   M CS-LTranv Spinal Y Y     Comments: gastroschisis  2 Term 04/23/11 539w2d 10:26 / 01:14 7 lb 2.8 oz (3.255 kg) F Vag-Spont EPI  Y     Comments: None  1 Gravida               Past Medical History  Diagnosis Date  . No pertinent past medical history   . Preterm labor   . Infection     UTI  . Ovarian cyst   . Anemia      BP 111/88 mmHg  Pulse 100  Temp(Src) 97.6 F (36.4 C)  Resp 18  LMP 10/16/2015  CONSTITUTIONAL: Well-developed, well-nourished female in no acute distress.  MUSCULOSKELETAL: Normal range of motion.  CARDIOVASCULAR: Regular heart rate RESPIRATORY: Normal effort NEUROLOGICAL: Alert and oriented to person, place, and time.  SKIN: Skin is warm and dry. No rash noted. Not diaphoretic. No erythema. No pallor. PSYCH: Normal mood and affect. Normal behavior. Normal judgment and thought content.  Results for orders placed or performed during the hospital encounter of 11/17/15 (from the past 24 hour(s))  hCG, quantitative, pregnancy     Status: Abnormal   Collection Time: 11/17/15 12:42 PM  Result Value Ref Range   hCG, Beta Chain, Quant, S 10469 (H) <5 mIU/mL    Ref. Range 11/14/2015 18:16  HCG, Beta Chain, Quant, S Latest Ref Range: <5 mIU/mL 2477 (H)   Discussed excellent rise in HCG. Patient is thrilled.  A: Appropriate  rise in quant hCG after 48 hours  P: Discharge home First trimester/ectopic precautions discussed Patient will return for follow-up US in 1 week. Order placed US will schedule. [facility]  Patient may return to MAU as needed or if her condition were to change or worsen   Aviva SignsMarie L Taisia Fantini, CNM 11/17/2015 2:34 PM

## 2015-11-17 NOTE — Discharge Instructions (Signed)
First Trimester of Pregnancy The first trimester of pregnancy is from week 1 until the end of week 12 (months 1 through 3). A week after a sperm fertilizes an egg, the egg will implant on the wall of the uterus. This embryo will begin to develop into a baby. Genes from you and your partner are forming the baby. The female genes determine whether the baby is a boy or a girl. At 6-8 weeks, the eyes and face are formed, and the heartbeat can be seen on ultrasound. At the end of 12 weeks, all the baby's organs are formed.  Now that you are pregnant, you will want to do everything you can to have a healthy baby. Two of the most important things are to get good prenatal care and to follow your health care provider's instructions. Prenatal care is all the medical care you receive before the baby's birth. This care will help prevent, find, and treat any problems during the pregnancy and childbirth. BODY CHANGES Your body goes through many changes during pregnancy. The changes vary from woman to woman.   You may gain or lose a couple of pounds at first.  You may feel sick to your stomach (nauseous) and throw up (vomit). If the vomiting is uncontrollable, call your health care provider.  You may tire easily.  You may develop headaches that can be relieved by medicines approved by your health care provider.  You may urinate more often. Painful urination may mean you have a bladder infection.  You may develop heartburn as a result of your pregnancy.  You may develop constipation because certain hormones are causing the muscles that push waste through your intestines to slow down.  You may develop hemorrhoids or swollen, bulging veins (varicose veins).  Your breasts may begin to grow larger and become tender. Your nipples may stick out more, and the tissue that surrounds them (areola) may become darker.  Your gums may bleed and may be sensitive to brushing and flossing.  Dark spots or blotches (chloasma,  mask of pregnancy) may develop on your face. This will likely fade after the baby is born.  Your menstrual periods will stop.  You may have a loss of appetite.  You may develop cravings for certain kinds of food.  You may have changes in your emotions from day to day, such as being excited to be pregnant or being concerned that something may go wrong with the pregnancy and baby.  You may have more vivid and strange dreams.  You may have changes in your hair. These can include thickening of your hair, rapid growth, and changes in texture. Some women also have hair loss during or after pregnancy, or hair that feels dry or thin. Your hair will most likely return to normal after your baby is born. WHAT TO EXPECT AT YOUR PRENATAL VISITS During a routine prenatal visit:  You will be weighed to make sure you and the baby are growing normally.  Your blood pressure will be taken.  Your abdomen will be measured to track your baby's growth.  The fetal heartbeat will be listened to starting around week 10 or 12 of your pregnancy.  Test results from any previous visits will be discussed. Your health care provider may ask you:  How you are feeling.  If you are feeling the baby move.  If you have had any abnormal symptoms, such as leaking fluid, bleeding, severe headaches, or abdominal cramping.  If you are using any tobacco products,   including cigarettes, chewing tobacco, and electronic cigarettes.  If you have any questions. Other tests that may be performed during your first trimester include:  Blood tests to find your blood type and to check for the presence of any previous infections. They will also be used to check for low iron levels (anemia) and Rh antibodies. Later in the pregnancy, blood tests for diabetes will be done along with other tests if problems develop.  Urine tests to check for infections, diabetes, or protein in the urine.  An ultrasound to confirm the proper growth  and development of the baby.  An amniocentesis to check for possible genetic problems.  Fetal screens for spina bifida and Down syndrome.  You may need other tests to make sure you and the baby are doing well.  HIV (human immunodeficiency virus) testing. Routine prenatal testing includes screening for HIV, unless you choose not to have this test. HOME CARE INSTRUCTIONS  Medicines  Follow your health care provider's instructions regarding medicine use. Specific medicines may be either safe or unsafe to take during pregnancy.  Take your prenatal vitamins as directed.  If you develop constipation, try taking a stool softener if your health care provider approves. Diet  Eat regular, well-balanced meals. Choose a variety of foods, such as meat or vegetable-based protein, fish, milk and low-fat dairy products, vegetables, fruits, and whole grain breads and cereals. Your health care provider will help you determine the amount of weight gain that is right for you.  Avoid raw meat and uncooked cheese. These carry germs that can cause birth defects in the baby.  Eating four or five small meals rather than three large meals a day may help relieve nausea and vomiting. If you start to feel nauseous, eating a few soda crackers can be helpful. Drinking liquids between meals instead of during meals also seems to help nausea and vomiting.  If you develop constipation, eat more high-fiber foods, such as fresh vegetables or fruit and whole grains. Drink enough fluids to keep your urine clear or pale yellow. Activity and Exercise  Exercise only as directed by your health care provider. Exercising will help you:  Control your weight.  Stay in shape.  Be prepared for labor and delivery.  Experiencing pain or cramping in the lower abdomen or low back is a good sign that you should stop exercising. Check with your health care provider before continuing normal exercises.  Try to avoid standing for long  periods of time. Move your legs often if you must stand in one place for a long time.  Avoid heavy lifting.  Wear low-heeled shoes, and practice good posture.  You may continue to have sex unless your health care provider directs you otherwise. Relief of Pain or Discomfort  Wear a good support bra for breast tenderness.   Take warm sitz baths to soothe any pain or discomfort caused by hemorrhoids. Use hemorrhoid cream if your health care provider approves.   Rest with your legs elevated if you have leg cramps or low back pain.  If you develop varicose veins in your legs, wear support hose. Elevate your feet for 15 minutes, 3-4 times a day. Limit salt in your diet. Prenatal Care  Schedule your prenatal visits by the twelfth week of pregnancy. They are usually scheduled monthly at first, then more often in the last 2 months before delivery.  Write down your questions. Take them to your prenatal visits.  Keep all your prenatal visits as directed by your   health care provider. Safety  Wear your seat belt at all times when driving.  Make a list of emergency phone numbers, including numbers for family, friends, the hospital, and police and fire departments. General Tips  Ask your health care provider for a referral to a local prenatal education class. Begin classes no later than at the beginning of month 6 of your pregnancy.  Ask for help if you have counseling or nutritional needs during pregnancy. Your health care provider can offer advice or refer you to specialists for help with various needs.  Do not use hot tubs, steam rooms, or saunas.  Do not douche or use tampons or scented sanitary pads.  Do not cross your legs for long periods of time.  Avoid cat litter boxes and soil used by cats. These carry germs that can cause birth defects in the baby and possibly loss of the fetus by miscarriage or stillbirth.  Avoid all smoking, herbs, alcohol, and medicines not prescribed by  your health care provider. Chemicals in these affect the formation and growth of the baby.  Do not use any tobacco products, including cigarettes, chewing tobacco, and electronic cigarettes. If you need help quitting, ask your health care provider. You may receive counseling support and other resources to help you quit.  Schedule a dentist appointment. At home, brush your teeth with a soft toothbrush and be gentle when you floss. SEEK MEDICAL CARE IF:   You have dizziness.  You have mild pelvic cramps, pelvic pressure, or nagging pain in the abdominal area.  You have persistent nausea, vomiting, or diarrhea.  You have a bad smelling vaginal discharge.  You have pain with urination.  You notice increased swelling in your face, hands, legs, or ankles. SEEK IMMEDIATE MEDICAL CARE IF:   You have a fever.  You are leaking fluid from your vagina.  You have spotting or bleeding from your vagina.  You have severe abdominal cramping or pain.  You have rapid weight gain or loss.  You vomit blood or material that looks like coffee grounds.  You are exposed to German measles and have never had them.  You are exposed to fifth disease or chickenpox.  You develop a severe headache.  You have shortness of breath.  You have any kind of trauma, such as from a fall or a car accident.   This information is not intended to replace advice given to you by your health care provider. Make sure you discuss any questions you have with your health care provider.   Document Released: 07/30/2001 Document Revised: 08/26/2014 Document Reviewed: 06/15/2013 Elsevier Interactive Patient Education 2016 [employer]  

## 2015-11-17 NOTE — MAU Note (Signed)
Pt presents to MAU for follow up quant. Denies any vaginal bleeding  Or pain

## 2015-11-24 ENCOUNTER — Inpatient Hospital Stay (HOSPITAL_COMMUNITY)
Admission: AD | Admit: 2015-11-24 | Discharge: 2015-11-25 | Disposition: A | Discharge status: Home / Self Care | Payer: Medicaid Other | Source: Ambulatory Visit | Attending: Obstetrics & Gynecology | Admitting: Obstetrics & Gynecology

## 2015-11-24 DIAGNOSIS — R040 Epistaxis: Secondary | ICD-10-CM | POA: Insufficient documentation

## 2015-11-24 DIAGNOSIS — Z3A01 Less than 8 weeks gestation of pregnancy: Secondary | ICD-10-CM | POA: Insufficient documentation

## 2015-11-24 DIAGNOSIS — O26891 Other specified pregnancy related conditions, first trimester: Secondary | ICD-10-CM | POA: Insufficient documentation

## 2015-11-24 DIAGNOSIS — O219 Vomiting of pregnancy, unspecified: Secondary | ICD-10-CM

## 2015-11-24 DIAGNOSIS — R112 Nausea with vomiting, unspecified: Secondary | ICD-10-CM | POA: Insufficient documentation

## 2015-11-24 NOTE — MAU Note (Signed)
Nauseated all day. Some vomiting. Had nose bleed tonight. Vomiting 4-5 times today. Tried B6 but did not help. Denies vag bleeding or d/c. No diarrhea.

## 2015-11-25 ENCOUNTER — Encounter (HOSPITAL_COMMUNITY): Payer: Self-pay | Admitting: *Deleted

## 2015-11-25 DIAGNOSIS — O219 Vomiting of pregnancy, unspecified: Secondary | ICD-10-CM | POA: Diagnosis not present

## 2015-11-25 DIAGNOSIS — O26891 Other specified pregnancy related conditions, first trimester: Secondary | ICD-10-CM | POA: Diagnosis present

## 2015-11-25 DIAGNOSIS — Z3A01 Less than 8 weeks gestation of pregnancy: Secondary | ICD-10-CM | POA: Diagnosis not present

## 2015-11-25 DIAGNOSIS — R040 Epistaxis: Secondary | ICD-10-CM | POA: Diagnosis not present

## 2015-11-25 DIAGNOSIS — R112 Nausea with vomiting, unspecified: Secondary | ICD-10-CM | POA: Diagnosis not present

## 2015-11-25 LAB — URINALYSIS, ROUTINE W REFLEX MICROSCOPIC
BILIRUBIN URINE: NEGATIVE
Glucose, UA: NEGATIVE mg/dL
Hgb urine dipstick: NEGATIVE
KETONES UR: NEGATIVE mg/dL
LEUKOCYTES UA: NEGATIVE
NITRITE: NEGATIVE
PROTEIN: NEGATIVE mg/dL
Specific Gravity, Urine: 1.02 (ref 1.005–1.030)
pH: 6 (ref 5.0–8.0)

## 2015-11-25 MED ORDER — METOCLOPRAMIDE HCL 10 MG PO TABS: 10.0000 mg | ORAL_TABLET | Freq: Three times a day (TID) | ORAL | Status: DC

## 2015-11-25 MED ORDER — PROMETHAZINE HCL 25 MG PO TABS
25.0000 mg | ORAL_TABLET | Freq: Once | ORAL | Status: AC
  Administered 2015-11-25: 25 mg via ORAL
  Filled 2015-11-25: qty 1

## 2015-11-25 MED ORDER — PROMETHAZINE HCL 12.5 MG PO TABS: 12.5000 mg | ORAL_TABLET | Freq: Every evening | ORAL | Status: DC | PRN

## 2015-11-25 NOTE — MAU Provider Note (Signed)
History     CSN: [id number]  Arrival date and time: 11/24/15 2333   First Provider Initiated Contact with Patient 11/25/15 0036      Chief Complaint  Patient presents with  . Emesis During Pregnancy  . Epistaxis   HPI   [name] is a 25 y.o. female at [redacted]w[redacted]d presenting with on going nausea and vomiting. She has been seen in MAU for Beta Quant follow up's only. She was diagnosed with pregnancy of unknown location and is scheduled for an [place] next Tuesday to confirm viability. She denies vaginal bleeding or abdominal pain at this time.   She attests to vomiting 4-5 times in the last 24 hours.   OB History    Gravida Para Term Preterm AB TAB SAB Ectopic Multiple Living   0 0 0 3      Past Medical History  Diagnosis Date  . No pertinent past medical history   . Preterm labor   . Infection     UTI  . Ovarian cyst   . Anemia     Past Surgical History  Procedure Laterality Date  . Cesarean section    . Induced abortion      Family History  Problem Relation Age of Onset  . Asthma Brother   . Asthma Daughter   . Cancer Paternal Grandmother     Social History  Substance Use Topics  . Smoking status: Never Smoker   . Smokeless tobacco: Never Used  . Alcohol Use: No    Allergies: No Known Allergies  Prescriptions prior to admission  Medication Sig Dispense Refill Last Dose  . acetaminophen (TYLENOL) 500 MG tablet Take 1,000 mg by mouth every 6 (six) hours as needed for mild pain or headache.   Past Month at Unknown time  . metroNIDAZOLE (FLAGYL) 500 MG tablet Take 1 tablet (500 mg total) by mouth 2 (two) times daily. 14 tablet 0   . Prenatal Vit-Fe Fumarate-FA (PRENATAL COMPLETE) 14-0.4 MG TABS Take 1 tablet by mouth 1 day or 1 dose. 60 each 0    Results for orders placed or performed during the hospital encounter of 11/24/15 (from the past 48 hour(s))  Urinalysis, Routine w reflex microscopic (not at [facility])     Status: None   Collection Time:  11/25/15 12:05 AM  Result Value Ref Range   Color, Urine YELLOW YELLOW   APPearance CLEAR CLEAR   Specific Gravity, Urine 1.020 1.005 - 1.030   pH 6.0 5.0 - 8.0   Glucose, UA NEGATIVE NEGATIVE mg/dL   Hgb urine dipstick NEGATIVE NEGATIVE   Bilirubin Urine NEGATIVE NEGATIVE   Ketones, ur NEGATIVE NEGATIVE mg/dL   Protein, ur NEGATIVE NEGATIVE mg/dL   Nitrite NEGATIVE NEGATIVE   Leukocytes, UA NEGATIVE NEGATIVE    Comment: MICROSCOPIC NOT DONE ON URINES WITH NEGATIVE PROTEIN, BLOOD, LEUKOCYTES, NITRITE, OR GLUCOSE <1000 mg/dL.     Review of Systems  Constitutional: Negative for fever and chills.  Gastrointestinal: Positive for nausea and vomiting. Negative for abdominal pain.  Genitourinary: Negative for dysuria.   Physical Exam   Blood pressure 135/67, pulse 82, temperature 97.6 F (36.4 C), resp. rate 18, height  (1.626 m), weight 177 lb (80.287 kg), last menstrual period 10/16/2015, unknown if currently breastfeeding.  Physical Exam  Constitutional: She is oriented to person, place, and time. She appears well-developed and well-nourished. No distress.  Respiratory: Effort normal.  Musculoskeletal: Normal range of motion.  Neurological: She is  alert and oriented to person, place, and time.  Skin: Skin is warm. She is not diaphoretic.    MAU Course  Procedures  None  MDM Phenergan 25 mg PO given prior to discharge.   Assessment and Plan   A:  1. Nausea and vomiting in pregnancy     P:  Discharge home in stable condition RX: Reglan TID before meals        Phenergan PRN at bedtime Return to MAU if symptoms worsen Small, frequent meals   Duane LopeJennifer I Rasch, NP 11/25/2015 12:46 AM

## 2015-11-25 NOTE — MAU Note (Signed)
J Rasch NP able to treat pt from Triage. Phenergan 25mg  po given

## 2015-11-25 NOTE — Discharge Instructions (Signed)

## 2015-11-25 NOTE — Progress Notes (Signed)
Written and verbal d/c instructions given and understanding voiced. Will wait in lobby for after med and then d/c home

## 2015-11-27 ENCOUNTER — Ambulatory Visit (HOSPITAL_COMMUNITY): Payer: Medicaid Other

## 2015-11-28 ENCOUNTER — Ambulatory Visit (HOSPITAL_COMMUNITY)
Admission: RE | Admit: 2015-11-28 | Discharge: 2015-11-28 | Disposition: A | Discharge status: Home / Self Care | Payer: Medicaid Other | Source: Ambulatory Visit | Attending: Advanced Practice Midwife | Admitting: Advanced Practice Midwife

## 2015-11-28 ENCOUNTER — Ambulatory Visit (INDEPENDENT_AMBULATORY_CARE_PROVIDER_SITE_OTHER): Payer: Medicaid Other | Admitting: Advanced Practice Midwife

## 2015-11-28 DIAGNOSIS — Z3A01 Less than 8 weeks gestation of pregnancy: Secondary | ICD-10-CM | POA: Diagnosis not present

## 2015-11-28 DIAGNOSIS — Z36 Encounter for antenatal screening of mother: Secondary | ICD-10-CM | POA: Insufficient documentation

## 2015-11-28 DIAGNOSIS — O208 Other hemorrhage in early pregnancy: Secondary | ICD-10-CM | POA: Diagnosis not present

## 2015-11-28 DIAGNOSIS — O09891 Supervision of other high risk pregnancies, first trimester: Secondary | ICD-10-CM

## 2015-11-28 DIAGNOSIS — O26899 Other specified pregnancy related conditions, unspecified trimester: Secondary | ICD-10-CM

## 2015-11-28 DIAGNOSIS — O219 Vomiting of pregnancy, unspecified: Secondary | ICD-10-CM

## 2015-11-28 DIAGNOSIS — O30001 Twin pregnancy, unspecified number of placenta and unspecified number of amniotic sacs, first trimester: Secondary | ICD-10-CM

## 2015-11-28 DIAGNOSIS — R109 Unspecified abdominal pain: Secondary | ICD-10-CM

## 2015-11-28 MED ORDER — PROMETHAZINE HCL 25 MG PO TABS: 12.5000 mg | ORAL_TABLET | Freq: Four times a day (QID) | ORAL | Status: DC | PRN

## 2015-11-28 NOTE — Patient Instructions (Signed)
Multiple Pregnancy °Having a multiple pregnancy means you are carrying twins, triplets, or more. The majority of multiple pregnancies are twins. Naturally conceiving triplets or more (higher-order multiples) is quite rare. °Multiple pregnancies can sometimes be riskier than single pregnancies. You are more likely to have certain problems. For example, you are at higher risk for high blood pressure during pregnancy (preeclampsia). You may need to have more frequent appointments for prenatal care.  °CAUSES  °Sometimes your body releases more than one egg at a time. Having more than one egg fertilized by more than one sperm is the most common type of multiple pregnancy. Twins produced this way are fraternal. They are no more alike than other siblings. °Multiple pregnancies also result when one sperm fertilizes one egg and then that egg divides into more than one embryo. These are identical twins or triplets. Identical multiples are always the same gender and look very much alike. °RISK FACTORS °You may be at higher risk for having a multiple pregnancy if: °· You are older than 35. °· You have already had four or more children. °· You have a family history of multiple pregnancy. °· You have had fertility treatment. °SIGNS AND SYMPTOMS °Early signs and symptoms of multiple pregnancy include: °· Rapid weight gain in the first 3 months of pregnancy (first trimester). °· The uterus measuring larger than normal for your stage of pregnancy. °· More severe nausea. °· A higher-than-normal level of human chorionic gonadotropin (HCG). This is a hormone that your body produces in early pregnancy. °DIAGNOSIS  °Your health care provider may suspect a multiple pregnancy from your symptoms and a physical exam. The results of your HCG blood test may also suggest a multiple pregnancy. Your health care provider will confirm that you are carrying multiples by doing an ultrasound exam. This exam involves using sound waves and a computer to  get an image of the inside of your uterus. An ultrasound exam can confirm a multiple pregnancy after you have been pregnant for 6-8 weeks. °TREATMENT  °If you have a multiple pregnancy, you may need: °· Early screening for possible birth defects or genetic abnormalities. °· Frequent pelvic exams to check for signs of early labor. °· Frequent ultrasound exams during the second trimester. °· Frequent checks for high blood pressure and for protein in your urine. These are signs of preeclampsia. °· An anti-inflammatory medicine (corticosteroid) if you go into early labor. This medicine helps your babies' lungs mature. °· Magnesium sulfate to prevent cerebral palsy in your babies if you are expected to deliver before 32 weeks. °· Cesarean delivery if vaginal delivery is too dangerous. °HOME CARE INSTRUCTIONS  °Because your pregnancy may be considered high risk, you have to work closely with your health care team. You may also need to make some lifestyle changes. These may include the following: °· Increase your nutrition. °¨ Gaining about 40-50 lb (18-23 kg) is recommended when you are pregnant with multiples. Try to gain 1 lb (0.5 kg) a week for the first 20 weeks of your pregnancy. °¨ Your health care provider will let you know how many calories you should add to your diet each day. °¨ Eat healthy snacks often throughout the day. This can add calories and reduce nausea. °· Drink enough fluid to keep your urine clear or pale yellow. °· Take your prenatal vitamins. °· Take 1500-2000 mg of calcium daily starting at the 20th week of pregnancy until you deliver your babies. °· By 20-24 weeks, you may need to   limit your activities. °¨ Avoid strenuous activity and work. °¨ Ask your health care provider when you should stop having sexual intercourse. °¨ Rest often. °· Do not smoke. °· Do not drink alcohol. °· Arrange for extra help around the house. °· Keep all your prenatal appointments. °SEEK MEDICAL CARE IF: °· You have  dizziness. °· You have mild pelvic cramps, pelvic pressure, or nagging pain in the abdominal or low back area. °· You have persistent nausea, vomiting, or diarrhea. °· You have a bad smelling vaginal discharge. °· You have pain with urination. °· You are having trouble gaining weight. °· You notice increased swelling in your face, hands, legs, or ankles. °· You have a fever. °SEEK IMMEDIATE MEDICAL CARE IF:  °· You are leaking fluid from your vagina. °· You have spotting or bleeding from your vagina. °· You have severe abdominal cramping or pain. °· You have rapid weight gain or loss. °· You vomit blood or material that looks like coffee grounds. °· You are exposed to German measles and have never had them. °· You are exposed to fifth disease or chickenpox. °· You develop a severe headache. °· You have shortness of breath. °  °This information is not intended to replace advice given to you by your health care provider. Make sure you discuss any questions you have with your health care provider. °  °Document Released: 05/14/2008 Document Revised: 08/26/2014 Document Reviewed: 07/09/2013 °Elsevier Interactive Patient Education ©2016 [employer] ° °

## 2015-12-01 ENCOUNTER — Encounter: Payer: Self-pay | Admitting: Advanced Practice Midwife

## 2015-12-01 DIAGNOSIS — O09899 Supervision of other high risk pregnancies, unspecified trimester: Secondary | ICD-10-CM | POA: Insufficient documentation

## 2015-12-01 DIAGNOSIS — O30049 Twin pregnancy, dichorionic/diamniotic, unspecified trimester: Secondary | ICD-10-CM | POA: Insufficient documentation

## 2015-12-01 NOTE — Progress Notes (Signed)
Ultrasounds Results Note  SUBJECTIVE HPI:  Ms. Maria Olson is a 25 y.o. [id number] at [redacted]w[redacted]d by LMP who presents to the [facility] for followup ultrasound results. The patient denies abdominal pain or vaginal bleeding.  Upon review of the patient's records, patient was first seen in MAU on 11/14/15 for abd pain in pregnancy.   BHCG on that day was 2400.  Ultrasound showed no IUP or ectopic, ? GS.  Last seen in MAU on 11/17/15. BHCG was 45409.  Repeat ultrasound was performed earlier today.    Past Medical History  Diagnosis Date  . No pertinent past medical history   . Preterm labor   . Infection     UTI  . Ovarian cyst   . Anemia    Past Surgical History  Procedure Laterality Date  . Cesarean section    . Induced abortion     Social History   Social History  . Marital Status: Single    Spouse Name: N/A  . Number of Children: N/A  . Years of Education: N/A   Occupational History  . Not on file.   Social History Main Topics  . Smoking status: Never Smoker   . Smokeless tobacco: Never Used  . Alcohol Use: No  . Drug Use: No  . Sexual Activity: Yes   Other Topics Concern  . Not on file   Social History Narrative   Current Outpatient Prescriptions on File Prior to Visit  Medication Sig Dispense Refill  . acetaminophen (TYLENOL) 500 MG tablet Take 1,000 mg by mouth every 6 (six) hours as needed for mild pain or headache.    . metoCLOPramide (REGLAN) 10 MG tablet Take 1 tablet (10 mg total) by mouth 3 (three) times daily before meals. 30 tablet 0  . metroNIDAZOLE (FLAGYL) 500 MG tablet Take 1 tablet (500 mg total) by mouth 2 (two) times daily. 14 tablet 0  . Prenatal Vit-Fe Fumarate-FA (PRENATAL COMPLETE) 14-0.4 MG TABS Take 1 tablet by mouth 1 day or 1 dose. 60 each 0   No current facility-administered medications on file prior to visit.   No Known Allergies  I have reviewed patient's Past Medical Hx, Surgical Hx, Family Hx, Social Hx, medications and  allergies.   Review of Systems Review of Systems  Constitutional: NePositive for nausea, vomiting ~3 x/day. Neg for abdominal pain, diarrhea. GU: Neg for vaginal bleeding Neurological: Negative for dizziness and weakness.    Physical Exam  LMP 10/16/2015  GENERAL: Well-developed, well-nourished female in no acute distress.  Head: Mucus membranes moist HEENT: Normocephalic, atraumatic.   LUNGS: Effort normalr HEART: Regular rate  SKIN: Warm, dry and without erythema PSYCH: Normal mood and affect NEURO: Alert and oriented x 4  LAB RESULTS No results found for this or any previous visit (from the past 24 hour(s)).  IMAGING US Ob Comp Less 14 Wks  11/14/2015  CLINICAL DATA:  25 year old pregnant female presenting with pelvic pain for 2 days. Pending quantitative beta HCG. EDC by estimated LMP: 07/22/2016, projecting to an expected gestational age of [redacted] weeks 1 day. EXAM: OBSTETRIC <14 WK [place] AND TRANSVAGINAL OB US TECHNIQUE: Both transabdominal and transvaginal ultrasound examinations were performed for complete evaluation of the gestation as well as the maternal uterus, adnexal regions, and pelvic cul-de-sac. Transvaginal technique was performed to assess early pregnancy. COMPARISON:  No prior scans from this gestation. FINDINGS: The anteverted anteflexed uterus measures 9.4 x 5.5 by 5.3 cm and appears normal in size and configuration. Apparent  normal Cesarean scar in the anterior lower uterine segment. No uterine fibroids or other myometrial abnormality. Nonspecific tiny 0.4 x 0.2 x 0.3 cm cystic structure in the endometrial cavity, without internal yolk sac, embryo or embryonic cardiac activity. If this represents an intrauterine gestational sac, the gestational age is 5 weeks 0 days by mean sac diameter. No endometrial cavity fluid or additional focal endometrial findings. Right ovary measures 2.2 x 1.6 x 1.7 cm. Left ovary is seen only on the transabdominal images and measures 4.3 x 2.1 x 2.9  cm and appears to contain a normal corpus luteum. No suspicious ovarian or adnexal findings. No abnormal free fluid in the pelvis. IMPRESSION: 1. No definitive localization of the pregnancy at ultrasound. Nonspecific tiny 0.3 cm cystic structure in the endometrial cavity could represent an early intrauterine gestation, although there are no definitive markers of pregnancy at this time such as a yolk sac, embryo or embryonic cardiac activity. If this endometrial cavity cystic structure represents an intrauterine gestational sac, the gestational age is 5 weeks 0 days by mean sac diameter. 2. No suspicious ovarian or adnexal findings. An occult ectopic pregnancy cannot be excluded on the basis of this study. 3. Recommend close clinical follow-up and serial quantitative beta HCG monitoring, with follow-up obstetric scan in 3-4 weeks, or earlier as clinically warranted. Electronically Signed   By: Delbert PhenixJason A Poff M.D.   On: 11/14/2015 19:31   [place] Ob Transvaginal  11/28/2015  EXAM: TWIN OBSTETRICAL ULTRASOUND <14 WKS COMPARISON:  None. FINDINGS: TWIN 1 Yolk sac:  Visualized Embryo:  Visualized Cardiac Activity: Visualized Heart Rate: 111 bpm MSD:   mm    w     d CRL:   4.6  mm   6 w 1 d                  US EDC: 07/22/2016 TWIN 2 Yolk sac:  Visualized Embryo:  Visualized Cardiac Activity: Visualized Heart Rate: 124 bpm MSD:   mm    w     d CRL:   5.3  mm   6 w 2 d                  US EDC: 07/21/2016 Maternal uterus/adnexae: Small subchorionic hemorrhage. Small amount of free fluid in the pelvis. IMPRESSION: Twin gestation as above.  Small subchorionic hemorrhage. Electronically Signed   By: Charlett NoseKevin  Dover M.D.   On: 11/28/2015 14:02   [place] Ob Transvaginal  11/14/2015  CLINICAL DATA:  25 year old pregnant female presenting with pelvic pain for 2 days. Pending quantitative beta HCG. EDC by estimated LMP: 07/22/2016, projecting to an expected gestational age of [redacted] weeks 1 day. EXAM: OBSTETRIC <14 WK US AND TRANSVAGINAL OB US  TECHNIQUE: Both transabdominal and transvaginal ultrasound examinations were performed for complete evaluation of the gestation as well as the maternal uterus, adnexal regions, and pelvic cul-de-sac. Transvaginal technique was performed to assess early pregnancy. COMPARISON:  No prior scans from this gestation. FINDINGS: The anteverted anteflexed uterus measures 9.4 x 5.5 by 5.3 cm and appears normal in size and configuration. Apparent normal Cesarean scar in the anterior lower uterine segment. No uterine fibroids or other myometrial abnormality. Nonspecific tiny 0.4 x 0.2 x 0.3 cm cystic structure in the endometrial cavity, without internal yolk sac, embryo or embryonic cardiac activity. If this represents an intrauterine gestational sac, the gestational age is 5 weeks 0 days by mean sac diameter. No endometrial cavity fluid or additional focal endometrial findings. Right ovary  measures 2.2 x 1.6 x 1.7 cm. Left ovary is seen only on the transabdominal images and measures 4.3 x 2.1 x 2.9 cm and appears to contain a normal corpus luteum. No suspicious ovarian or adnexal findings. No abnormal free fluid in the pelvis. IMPRESSION: 1. No definitive localization of the pregnancy at ultrasound. Nonspecific tiny 0.3 cm cystic structure in the endometrial cavity could represent an early intrauterine gestation, although there are no definitive markers of pregnancy at this time such as a yolk sac, embryo or embryonic cardiac activity. If this endometrial cavity cystic structure represents an intrauterine gestational sac, the gestational age is 5 weeks 0 days by mean sac diameter. 2. No suspicious ovarian or adnexal findings. An occult ectopic pregnancy cannot be excluded on the basis of this study. 3. Recommend close clinical follow-up and serial quantitative beta HCG monitoring, with follow-up obstetric scan in 3-4 weeks, or earlier as clinically warranted. Electronically Signed   By: Delbert Phenix M.D.   On: 11/14/2015 19:31     ASSESSMENT 1. Nausea and vomiting of pregnancy, antepartum   2. Supervision of other high risk pregnancy, antepartum, first trimester   3. Twin gestation in first trimester     PLAN Discharge home in stable condition Patient advised to start/continue taking prenatal vitamins Rx Phenergan Pregnancy confirmation letter given Patient advised to start prenatal care with [facility] provider of choice as soon as possible  [place], CNM  12/01/2015  2:34 PM

## 2015-12-13 ENCOUNTER — Encounter (HOSPITAL_COMMUNITY): Payer: Self-pay | Admitting: *Deleted

## 2015-12-13 ENCOUNTER — Inpatient Hospital Stay (HOSPITAL_COMMUNITY)
Admission: AD | Admit: 2015-12-13 | Discharge: 2015-12-13 | Disposition: A | Discharge status: Home / Self Care | Payer: Medicaid Other | Source: Ambulatory Visit | Attending: Obstetrics & Gynecology | Admitting: Obstetrics & Gynecology

## 2015-12-13 DIAGNOSIS — Z3A08 8 weeks gestation of pregnancy: Secondary | ICD-10-CM | POA: Insufficient documentation

## 2015-12-13 DIAGNOSIS — O26891 Other specified pregnancy related conditions, first trimester: Secondary | ICD-10-CM | POA: Insufficient documentation

## 2015-12-13 DIAGNOSIS — R519 Headache, unspecified: Secondary | ICD-10-CM

## 2015-12-13 DIAGNOSIS — R51 Headache: Secondary | ICD-10-CM | POA: Diagnosis not present

## 2015-12-13 LAB — URINALYSIS, ROUTINE W REFLEX MICROSCOPIC
BILIRUBIN URINE: NEGATIVE
Glucose, UA: NEGATIVE mg/dL
Hgb urine dipstick: NEGATIVE
KETONES UR: 15 mg/dL — AB
NITRITE: NEGATIVE
PH: 6.5 (ref 5.0–8.0)
PROTEIN: NEGATIVE mg/dL
Specific Gravity, Urine: 1.025 (ref 1.005–1.030)

## 2015-12-13 LAB — CBC
HEMATOCRIT: 34 % — AB (ref 36.0–46.0)
HEMOGLOBIN: 11.8 g/dL — AB (ref 12.0–15.0)
MCH: 30.3 pg (ref 26.0–34.0)
MCHC: 34.7 g/dL (ref 30.0–36.0)
MCV: 87.2 fL (ref 78.0–100.0)
PLATELETS: 323 10*3/uL (ref 150–400)
RBC: 3.9 MIL/uL (ref 3.87–5.11)
RDW: 12.9 % (ref 11.5–15.5)
WBC: 5.8 10*3/uL (ref 4.0–10.5)

## 2015-12-13 LAB — URINE MICROSCOPIC-ADD ON

## 2015-12-13 MED ORDER — LACTATED RINGERS IV BOLUS (SEPSIS)
1000.0000 mL | Freq: Once | INTRAVENOUS | Status: AC
  Administered 2015-12-13: 1000 mL via INTRAVENOUS

## 2015-12-13 MED ORDER — DIPHENHYDRAMINE HCL 50 MG/ML IJ SOLN
25.0000 mg | Freq: Once | INTRAMUSCULAR | Status: AC
  Administered 2015-12-13: 25 mg via INTRAVENOUS
  Filled 2015-12-13: qty 1

## 2015-12-13 MED ORDER — METOCLOPRAMIDE HCL 5 MG/ML IJ SOLN
10.0000 mg | Freq: Once | INTRAMUSCULAR | Status: AC
  Administered 2015-12-13: 10 mg via INTRAVENOUS
  Filled 2015-12-13: qty 2

## 2015-12-13 MED ORDER — DEXAMETHASONE SODIUM PHOSPHATE 10 MG/ML IJ SOLN
10.0000 mg | Freq: Once | INTRAMUSCULAR | Status: AC
  Administered 2015-12-13: 10 mg via INTRAVENOUS
  Filled 2015-12-13: qty 1

## 2015-12-13 NOTE — MAU Provider Note (Signed)
History     CSN: [id number]  Arrival date and time: 12/13/15 1756    First Provider Initiated Contact with Patient 12/13/15 1819       Chief Complaint  Patient presents with  . Headache  . "Can't keep body heat down"    Maria Olson is a 25 y.o. [phone] at [redacted]w[redacted]d who presents with a headache.   Headache  This is a new problem. The current episode started yesterday. The problem occurs constantly. The problem has been unchanged. The pain is located in the frontal region. The pain does not radiate. The quality of the pain is described as throbbing. The pain is at a severity of 8/10. Associated symptoms include vomiting (associated with pregnancy). Pertinent negatives include no abdominal pain, anorexia, blurred vision, dizziness, ear pain, hearing loss, phonophobia, photophobia, tinnitus or visual change. Nothing aggravates the symptoms. She has tried acetaminophen (1 ES tylenol yesterday) for the symptoms. The treatment provided no relief. There is no history of cluster headaches, hypertension, migraine headaches or recent head traumas.     OB History    Gravida Para Term Preterm AB TAB SAB Ectopic Multiple Living   0 0 3      Past Medical History  Diagnosis Date  . Preterm labor   . Infection     UTI  . Ovarian cyst   . Anemia     Past Surgical History  Procedure Laterality Date  . Cesarean section    . Induced abortion      Family History  Problem Relation Age of Onset  . Asthma Brother   . Asthma Daughter   . Cancer Paternal Grandmother     Social History  Substance Use Topics  . Smoking status: Never Smoker   . Smokeless tobacco: Never Used  . Alcohol Use: No    Allergies: No Known Allergies  Prescriptions prior to admission  Medication Sig Dispense Refill Last Dose  . acetaminophen (TYLENOL) 500 MG tablet Take 1,000 mg by mouth every 6 (six) hours as needed for mild pain or headache.   Past Month at Unknown time  . metoCLOPramide  (REGLAN) 10 MG tablet Take 1 tablet (10 mg total) by mouth 3 (three) times daily before meals. 30 tablet 0   . metroNIDAZOLE (FLAGYL) 500 MG tablet Take 1 tablet (500 mg total) by mouth 2 (two) times daily. 14 tablet 0   . Prenatal Vit-Fe Fumarate-FA (PRENATAL COMPLETE) 14-0.4 MG TABS Take 1 tablet by mouth 1 day or 1 dose. 60 each 0 11/24/2015 at Unknown time  . promethazine (PHENERGAN) 25 MG tablet Take 0.5 tablets (12.5 mg total) by mouth every 6 (six) hours as needed for nausea or vomiting. 30 tablet 2     Review of Systems  Constitutional: Negative.   HENT: Negative for ear pain, hearing loss and tinnitus.   Eyes: Negative for blurred vision and photophobia.  Gastrointestinal: Positive for vomiting (associated with pregnancy). Negative for abdominal pain and anorexia.  Genitourinary: Negative.   Neurological: Positive for headaches. Negative for dizziness.   Physical Exam   Blood pressure 107/57, pulse 98, temperature 98.2 F (36.8 C), temperature source Oral, resp. rate 16, height  (1.626 m), weight 177 lb (80.287 kg), last menstrual period 10/16/2015, unknown if currently breastfeeding.  Physical Exam  Nursing note and vitals reviewed. Constitutional: She is oriented to person, place, and time. She appears well-developed and well-nourished. No distress.  HENT:  Head: Normocephalic and atraumatic.  Eyes: Conjunctivae are normal. Right eye exhibits no discharge. Left eye exhibits no discharge. No scleral icterus.  Neck: Normal range of motion.  Cardiovascular: Normal rate, regular rhythm and normal heart sounds.   No murmur heard. Respiratory: Effort normal and breath sounds normal. No respiratory distress. She has no wheezes.  GI: Soft. There is no tenderness.  Neurological: She is alert and oriented to person, place, and time.  Skin: Skin is warm and dry. She is not diaphoretic.  Psychiatric: She has a normal mood and affect. Her behavior is normal. Judgment and thought  content normal.    MAU Course  Procedures Results for orders placed or performed during the hospital encounter of 12/13/15 (from the past 24 hour(s))  Urinalysis, Routine w reflex microscopic (not at [id number])     Status: Abnormal   Collection Time: 12/13/15  6:00 PM  Result Value Ref Range   Color, Urine YELLOW YELLOW   APPearance HAZY (A) CLEAR   Specific Gravity, Urine 1.025 1.005 - 1.030   pH 6.5 5.0 - 8.0   Glucose, UA NEGATIVE NEGATIVE mg/dL   Hgb urine dipstick NEGATIVE NEGATIVE   Bilirubin Urine NEGATIVE NEGATIVE   Ketones, ur 15 (A) NEGATIVE mg/dL   Protein, ur NEGATIVE NEGATIVE mg/dL   Nitrite NEGATIVE NEGATIVE   Leukocytes, UA SMALL (A) NEGATIVE  Urine microscopic-add on     Status: Abnormal   Collection Time: 12/13/15  6:00 PM  Result Value Ref Range   Squamous Epithelial / LPF 0-5 (A) NONE SEEN   WBC, UA 0-5 0 - 5 WBC/hpf   RBC / HPF 0-5 0 - 5 RBC/hpf   Bacteria, UA FEW (A) NONE SEEN  CBC     Status: Abnormal   Collection Time: 12/13/15  6:30 PM  Result Value Ref Range   WBC 5.8 4.0 - 10.5 K/uL   RBC 3.90 3.87 - 5.11 MIL/uL   Hemoglobin 11.8 (L) 12.0 - 15.0 g/dL   HCT 16.134.0 (L) 09.636.0 - 04.546.0 %   MCV 87.2 78.0 - 100.0 fL   MCH 30.3 26.0 - 34.0 pg   MCHC 34.7 30.0 - 36.0 g/dL   RDW 40.912.9 81.111.5 - 91.415.5 %   Platelets 323 150 - 400 K/uL    MDM Headache cocktail given via IV (reglan, decadron, & benadryl) Reports complete resolution of headache soon after administration of meds  CBC & u/a normal Assessment and Plan  A; 1. Headache in pregnancy, antepartum, first trimester    P; Discharge home Take tylenol as needed for headache Discussed reasons to return to MAU Keep f/u with ob  Maria Olson 12/13/2015, 6:18 PM

## 2015-12-13 NOTE — MAU Note (Signed)
Patient presents at [redacted] weeks gestation with c/o a headache X 3 days unrelieved by one 500mg  tylenol. Also c/o body heat today. Denies bleeding or discharge.

## 2015-12-13 NOTE — Discharge Instructions (Signed)

## 2015-12-28 ENCOUNTER — Inpatient Hospital Stay (HOSPITAL_COMMUNITY)
Admission: AD | Admit: 2015-12-28 | Discharge: 2015-12-28 | Discharge status: Against Medical Advice | Payer: Medicaid Other | Source: Ambulatory Visit | Attending: Obstetrics and Gynecology | Admitting: Obstetrics and Gynecology

## 2015-12-28 DIAGNOSIS — O039 Complete or unspecified spontaneous abortion without complication: Secondary | ICD-10-CM | POA: Insufficient documentation

## 2015-12-28 LAB — URINALYSIS, ROUTINE W REFLEX MICROSCOPIC
Bilirubin Urine: NEGATIVE
Glucose, UA: NEGATIVE mg/dL
Hgb urine dipstick: NEGATIVE
Ketones, ur: NEGATIVE mg/dL
NITRITE: NEGATIVE
Protein, ur: NEGATIVE mg/dL
pH: 5.5 (ref 5.0–8.0)

## 2015-12-28 LAB — URINE MICROSCOPIC-ADD ON: RBC / HPF: NONE SEEN RBC/hpf (ref 0–5)

## 2015-12-28 NOTE — MAU Note (Signed)
Not in lobby x 3  

## 2015-12-28 NOTE — MAU Note (Signed)
Not in lobby x2.

## 2015-12-28 NOTE — MAU Note (Signed)
Pt reports she has had a mucusy discharge that came out thinks it is her mucus plug. Some back apin as well.

## 2015-12-28 NOTE — MAU Note (Signed)
Not in lobby x1  

## 2016-01-09 ENCOUNTER — Ambulatory Visit (INDEPENDENT_AMBULATORY_CARE_PROVIDER_SITE_OTHER): Payer: Medicaid Other | Admitting: Certified Nurse Midwife

## 2016-01-09 ENCOUNTER — Other Ambulatory Visit (HOSPITAL_COMMUNITY)
Admission: RE | Admit: 2016-01-09 | Discharge: 2016-01-09 | Disposition: A | Discharge status: Home / Self Care | Payer: Medicaid Other | Source: Ambulatory Visit | Attending: Obstetrics and Gynecology | Admitting: Obstetrics and Gynecology

## 2016-01-09 ENCOUNTER — Encounter: Payer: Self-pay | Admitting: Certified Nurse Midwife

## 2016-01-09 ENCOUNTER — Other Ambulatory Visit: Payer: Self-pay | Admitting: Certified Nurse Midwife

## 2016-01-09 VITALS — BP 122/69 | HR 107 | Temp 98.1°F | Wt 195.0 lb

## 2016-01-09 DIAGNOSIS — O09813 Supervision of pregnancy resulting from assisted reproductive technology, third trimester: Secondary | ICD-10-CM | POA: Diagnosis present

## 2016-01-09 DIAGNOSIS — O09891 Supervision of other high risk pregnancies, first trimester: Secondary | ICD-10-CM

## 2016-01-09 DIAGNOSIS — O09211 Supervision of pregnancy with history of pre-term labor, first trimester: Secondary | ICD-10-CM

## 2016-01-09 DIAGNOSIS — Z113 Encounter for screening for infections with a predominantly sexual mode of transmission: Secondary | ICD-10-CM | POA: Diagnosis not present

## 2016-01-09 DIAGNOSIS — O3680X2 Pregnancy with inconclusive fetal viability, fetus 2: Secondary | ICD-10-CM | POA: Diagnosis not present

## 2016-01-09 DIAGNOSIS — O09219 Supervision of pregnancy with history of pre-term labor, unspecified trimester: Secondary | ICD-10-CM

## 2016-01-09 DIAGNOSIS — O3680X1 Pregnancy with inconclusive fetal viability, fetus 1: Secondary | ICD-10-CM

## 2016-01-09 DIAGNOSIS — O09899 Supervision of other high risk pregnancies, unspecified trimester: Secondary | ICD-10-CM | POA: Insufficient documentation

## 2016-01-09 DIAGNOSIS — O09291 Supervision of pregnancy with other poor reproductive or obstetric history, first trimester: Secondary | ICD-10-CM | POA: Diagnosis not present

## 2016-01-09 DIAGNOSIS — Z01419 Encounter for gynecological examination (general) (routine) without abnormal findings: Secondary | ICD-10-CM | POA: Insufficient documentation

## 2016-01-09 DIAGNOSIS — Z98891 History of uterine scar from previous surgery: Secondary | ICD-10-CM

## 2016-01-09 DIAGNOSIS — O30001 Twin pregnancy, unspecified number of placenta and unspecified number of amniotic sacs, first trimester: Secondary | ICD-10-CM

## 2016-01-09 DIAGNOSIS — Z36 Encounter for antenatal screening of mother: Secondary | ICD-10-CM | POA: Diagnosis present

## 2016-01-09 DIAGNOSIS — O34219 Maternal care for unspecified type scar from previous cesarean delivery: Secondary | ICD-10-CM | POA: Diagnosis not present

## 2016-01-09 DIAGNOSIS — Z124 Encounter for screening for malignant neoplasm of cervix: Secondary | ICD-10-CM

## 2016-01-09 DIAGNOSIS — O09299 Supervision of pregnancy with other poor reproductive or obstetric history, unspecified trimester: Secondary | ICD-10-CM | POA: Insufficient documentation

## 2016-01-09 LAB — POCT URINALYSIS DIP (DEVICE)
Bilirubin Urine: NEGATIVE
GLUCOSE, UA: NEGATIVE mg/dL
HGB URINE DIPSTICK: NEGATIVE
Ketones, ur: NEGATIVE mg/dL
NITRITE: NEGATIVE
PROTEIN: NEGATIVE mg/dL
UROBILINOGEN UA: 1 mg/dL (ref 0.0–1.0)
pH: 6 (ref 5.0–8.0)

## 2016-01-09 LAB — OB RESULTS CONSOLE GBS: STREP GROUP B AG: NEGATIVE

## 2016-01-09 NOTE — Patient Instructions (Signed)
First Trimester of Pregnancy The first trimester of pregnancy is from week 1 until the end of week 12 (months 1 through 3). A week after a sperm fertilizes an egg, the egg will implant on the wall of the uterus. This embryo will begin to develop into a baby. Genes from you and your partner are forming the baby. The female genes determine whether the baby is a boy or a girl. At 6-8 weeks, the eyes and face are formed, and the heartbeat can be seen on ultrasound. At the end of 12 weeks, all the baby's organs are formed.  Now that you are pregnant, you will want to do everything you can to have a healthy baby. Two of the most important things are to get good prenatal care and to follow your health care provider's instructions. Prenatal care is all the medical care you receive before the baby's birth. This care will help prevent, find, and treat any problems during the pregnancy and childbirth. BODY CHANGES Your body goes through many changes during pregnancy. The changes vary from woman to woman.   You may gain or lose a couple of pounds at first.  You may feel sick to your stomach (nauseous) and throw up (vomit). If the vomiting is uncontrollable, call your health care provider.  You may tire easily.  You may develop headaches that can be relieved by medicines approved by your health care provider.  You may urinate more often. Painful urination may mean you have a bladder infection.  You may develop heartburn as a result of your pregnancy.  You may develop constipation because certain hormones are causing the muscles that push waste through your intestines to slow down.  You may develop hemorrhoids or swollen, bulging veins (varicose veins).  Your breasts may begin to grow larger and become tender. Your nipples may stick out more, and the tissue that surrounds them (areola) may become darker.  Your gums may bleed and may be sensitive to brushing and flossing.  Dark spots or blotches (chloasma,  mask of pregnancy) may develop on your face. This will likely fade after the baby is born.  Your menstrual periods will stop.  You may have a loss of appetite.  You may develop cravings for certain kinds of food.  You may have changes in your emotions from day to day, such as being excited to be pregnant or being concerned that something may go wrong with the pregnancy and baby.  You may have more vivid and strange dreams.  You may have changes in your hair. These can include thickening of your hair, rapid growth, and changes in texture. Some women also have hair loss during or after pregnancy, or hair that feels dry or thin. Your hair will most likely return to normal after your baby is born. WHAT TO EXPECT AT YOUR PRENATAL VISITS During a routine prenatal visit:  You will be weighed to make sure you and the baby are growing normally.  Your blood pressure will be taken.  Your abdomen will be measured to track your baby's growth.  The fetal heartbeat will be listened to starting around week 10 or 12 of your pregnancy.  Test results from any previous visits will be discussed. Your health care provider may ask you:  How you are feeling.  If you are feeling the baby move.  If you have had any abnormal symptoms, such as leaking fluid, bleeding, severe headaches, or abdominal cramping.  If you are using any tobacco products,   including cigarettes, chewing tobacco, and electronic cigarettes.  If you have any questions. Other tests that may be performed during your first trimester include:  Blood tests to find your blood type and to check for the presence of any previous infections. They will also be used to check for low iron levels (anemia) and Rh antibodies. Later in the pregnancy, blood tests for diabetes will be done along with other tests if problems develop.  Urine tests to check for infections, diabetes, or protein in the urine.  An ultrasound to confirm the proper growth  and development of the baby.  An amniocentesis to check for possible genetic problems.  Fetal screens for spina bifida and Down syndrome.  You may need other tests to make sure you and the baby are doing well.  HIV (human immunodeficiency virus) testing. Routine prenatal testing includes screening for HIV, unless you choose not to have this test. HOME CARE INSTRUCTIONS  Medicines  Follow your health care provider's instructions regarding medicine use. Specific medicines may be either safe or unsafe to take during pregnancy.  Take your prenatal vitamins as directed.  If you develop constipation, try taking a stool softener if your health care provider approves. Diet  Eat regular, well-balanced meals. Choose a variety of foods, such as meat or vegetable-based protein, fish, milk and low-fat dairy products, vegetables, fruits, and whole grain breads and cereals. Your health care provider will help you determine the amount of weight gain that is right for you.  Avoid raw meat and uncooked cheese. These carry germs that can cause birth defects in the baby.  Eating four or five small meals rather than three large meals a day may help relieve nausea and vomiting. If you start to feel nauseous, eating a few soda crackers can be helpful. Drinking liquids between meals instead of during meals also seems to help nausea and vomiting.  If you develop constipation, eat more high-fiber foods, such as fresh vegetables or fruit and whole grains. Drink enough fluids to keep your urine clear or pale yellow. Activity and Exercise  Exercise only as directed by your health care provider. Exercising will help you:  Control your weight.  Stay in shape.  Be prepared for labor and delivery.  Experiencing pain or cramping in the lower abdomen or low back is a good sign that you should stop exercising. Check with your health care provider before continuing normal exercises.  Try to avoid standing for long  periods of time. Move your legs often if you must stand in one place for a long time.  Avoid heavy lifting.  Wear low-heeled shoes, and practice good posture.  You may continue to have sex unless your health care provider directs you otherwise. Relief of Pain or Discomfort  Wear a good support bra for breast tenderness.   Take warm sitz baths to soothe any pain or discomfort caused by hemorrhoids. Use hemorrhoid cream if your health care provider approves.   Rest with your legs elevated if you have leg cramps or low back pain.  If you develop varicose veins in your legs, wear support hose. Elevate your feet for 15 minutes, 3-4 times a day. Limit salt in your diet. Prenatal Care  Schedule your prenatal visits by the twelfth week of pregnancy. They are usually scheduled monthly at first, then more often in the last 2 months before delivery.  Write down your questions. Take them to your prenatal visits.  Keep all your prenatal visits as directed by your   health care provider. Safety  Wear your seat belt at all times when driving.  Make a list of emergency phone numbers, including numbers for family, friends, the hospital, and police and fire departments. General Tips  Ask your health care provider for a referral to a local prenatal education class. Begin classes no later than at the beginning of month 6 of your pregnancy.  Ask for help if you have counseling or nutritional needs during pregnancy. Your health care provider can offer advice or refer you to specialists for help with various needs.  Do not use hot tubs, steam rooms, or saunas.  Do not douche or use tampons or scented sanitary pads.  Do not cross your legs for long periods of time.  Avoid cat litter boxes and soil used by cats. These carry germs that can cause birth defects in the baby and possibly loss of the fetus by miscarriage or stillbirth.  Avoid all smoking, herbs, alcohol, and medicines not prescribed by  your health care provider. Chemicals in these affect the formation and growth of the baby.  Do not use any tobacco products, including cigarettes, chewing tobacco, and electronic cigarettes. If you need help quitting, ask your health care provider. You may receive counseling support and other resources to help you quit.  Schedule a dentist appointment. At home, brush your teeth with a soft toothbrush and be gentle when you floss. SEEK MEDICAL CARE IF:   You have dizziness.  You have mild pelvic cramps, pelvic pressure, or nagging pain in the abdominal area.  You have persistent nausea, vomiting, or diarrhea.  You have a bad smelling vaginal discharge.  You have pain with urination.  You notice increased swelling in your face, hands, legs, or ankles. SEEK IMMEDIATE MEDICAL CARE IF:   You have a fever.  You are leaking fluid from your vagina.  You have spotting or bleeding from your vagina.  You have severe abdominal cramping or pain.  You have rapid weight gain or loss.  You vomit blood or material that looks like coffee grounds.  You are exposed to [place] measles and have never had them.  You are exposed to fifth disease or chickenpox.  You develop a severe headache.  You have shortness of breath.  You have any kind of trauma, such as from a fall or a car accident.   This information is not intended to replace advice given to you by your health care provider. Make sure you discuss any questions you have with your health care provider.   Document Released: 07/30/2001 Document Revised: 08/26/2014 Document Reviewed: 06/15/2013 Elsevier Interactive Patient Education 2016 [employer] Breastfeeding Twins or Multiples Breastfeeding benefits you and your babies. Breastfeeding:  Causes the uterus to contract and return to its original size faster.  Releases hormones that relax you.  Saves money and time. Your milk is available whenever your babies are ready to feed, and  it is at the right temperature.  Ensures your babies get the best nutrition. Breast milk is especially beneficial to multiples, who are often small at birth and need all the advantages breast milk can provide.  Creates a unique bond between you and each of your babies. HOW DO I GET STARTED? Nurse as soon as possible after delivery and as often as your babies want to be nursed. This will stimulate your breasts to produce enough milk. Mothers of multiples almost always produce enough milk for all their babies.  If your babies are premature and unable to  nurse, you can pump your breasts and freeze the milk until your babies are ready to feed at the breast. To stimulate a milk supply, your breasts need to be emptied at least 8-10 times in a 24-hour period. Ask a lactation specialist to help you choose an effective breast pump and to provide guidance in helping your babies latch on to and feed from the breast when they are ready.  SHOULD I NURSE MY BABIES TOGETHER? Many mothers of multiples find it easiest to nurse two babies at the same time. Nursing babies together may also increase milk-producing hormone (prolactin) levels. The more often the babies nurse effectively, the more milk you will produce. When nursing your babies together:  Ask your nurse or lactation specialist to suggest tips on positioning. There are several positions and holds that make it easier to nurse more than one baby at a time.  Try placing pillows under your arms, legs, and the babies for comfort. Nursing two babies at the same time often gets easier as the babies get older and more experienced at latching on to the breast.  Switch the babies from one side to the other at alternate feedings. For example, if baby A feeds from the right breast and baby B feeds from the left breast, then at the next feeding baby A should take the left breast and baby B the right breast. This ensures that both breasts get equal amounts of  stimulation. It also allows the stronger sucking baby to increase the milk supply for the baby whose suck is weaker. WHAT ARE SOME TIPS TO INCREASE MY SUCCESS?  If you are nursing babies together and one of the babies is having difficulty latching or sucking, try nursing that baby separately so you can give him or her your full attention.  If you are nursing babies separately and one of the babies is having difficulty feeding, it may help to nurse that baby at the same time as his or her sibling. The baby with the stronger or more effective suck will stimulate the mother's milk to flow faster. This will encourage the baby who is having difficulty to suck and swallow correctly.   Try not to give your babies bottles and pacifiers during the early weeks of breastfeeding. Avoiding these things encourages effective sucking patterns and helps establish a good milk supply. You should not need supplemental feedings if you empty your breasts with each feeding.   A good latch for both infants is important in helping the babies empty the breast effectively and for avoiding sore nipples. The most common cause of sore nipples is an improper latch.   Keep track of each baby's stools and wet diapers for the first 6 weeks to make sure each baby is getting enough milk. Each baby should have 6-8 wet diapers and 2 or more bowel movements per day.    This information is not intended to replace advice given to you by your health care provider. Make sure you discuss any questions you have with your health care provider.   Document Released: 12/03/2004 Document Revised: 08/10/2013 Document Reviewed: 05/17/2013 Elsevier Interactive Patient Education [employer]

## 2016-01-09 NOTE — Progress Notes (Signed)
Twin A - positive fetal heart tones Twin B positive fetal heart tones   Subjective:    Maria Olson is a [id number] [redacted]w[redacted]d being seen today for her first obstetrical visit with a twin gestation. Her obstetrical history is significant for twin gestation, Hx of infant with Gastroschisis; Hx of Emergent Ceseran Section, Hx of successful VBAC. Patient does intend to breast feed. Pregnancy history fully reviewed.  Patient reports no complaints.  Filed Vitals:   01/09/16 1258  BP: 122/69  Pulse: 107  Temp: 98.1 F (36.7 C)  Weight: 195 lb (88.451 kg)    HISTORY: OB History  Gravida Para Term Preterm AB SAB TAB Ectopic Multiple Living  0 0 3    # Outcome Date GA Lbr Len/2nd Weight Sex Delivery Anes PTL Lv  7 Current           6 SAB 07/28/15          5 Term 03/02/14 [redacted]w[redacted]d 18:50 / 00:48 6 lb 12.3 oz (3.07 kg) F VBAC EPI  Y  4 TAB 2014          3 TAB 2014          2 Preterm 03/27/12 [redacted]w[redacted]d   M CS-LTranv Spinal Y Y     Comments: gastroschisis  1 Term 04/23/11 [redacted]w[redacted]d 10:26 / 01:14 7 lb 2.8 oz (3.255 kg) F Vag-Spont EPI  Y     Comments: None     Past Medical History  Diagnosis Date  . Preterm labor   . Infection     UTI  . Ovarian cyst   . Anemia    Past Surgical History  Procedure Laterality Date  . Cesarean section    . Induced abortion     Family History  Problem Relation Age of Onset  . Asthma Brother   . Asthma Daughter   . Cancer Paternal Grandmother      Exam    Uterus:     Pelvic Exam:    Perineum: No Hemorrhoids   Vulva: normal   Vagina:  normal mucosa   pH:    Cervix: no bleeding following Pap   Adnexa: not evaluated   Bony Pelvis: gynecoid  System: Breast:     Skin: normal coloration and turgor, no rashes    Neurologic: oriented, normal, normal mood   Extremities: normal strength, tone, and muscle mass   HEENT    Mouth/Teeth mucous membranes moist, pharynx normal without lesions   Neck supple and no masses   Cardiovascular:  regular rate and rhythm   Respiratory:  appears well, vitals normal, no respiratory distress, acyanotic, normal RR, ear and throat exam is normal, neck free of mass or lymphadenopathy, chest clear, no wheezing, crepitations, rhonchi, normal symmetric air entry   Abdomen: soft, non-tender; bowel sounds normal; no masses,  no organomegaly   Urinary: urethral meatus normal      Assessment:    Pregnancy: [id number] Patient Active Problem List   Diagnosis Date Noted  . Hx successful VBAC (vaginal birth after cesarean), currently pregnant 01/09/2016  . Hx of cesarean section 01/09/2016  . Hx of fetal anomaly in prior pregnancy, currently pregnant 01/09/2016  . Supervision of other high risk pregnancy, antepartum 12/01/2015  . Twin gestation in first trimester 12/01/2015     Results for orders placed or performed in visit on 01/09/16 (from the past 24 hour(s))  POCT urinalysis dip (device)     Status: Abnormal  Collection Time: 01/09/16 12:56 PM  Result Value Ref Range   Glucose, UA NEGATIVE NEGATIVE mg/dL   Bilirubin Urine NEGATIVE NEGATIVE   Ketones, ur NEGATIVE NEGATIVE mg/dL   Specific Gravity, Urine >=1.030 1.005 - 1.030   Hgb urine dipstick NEGATIVE NEGATIVE   pH 6.0 5.0 - 8.0   Protein, ur NEGATIVE NEGATIVE mg/dL   Urobilinogen, UA 1.0 0.0 - 1.0 mg/dL   Nitrite NEGATIVE NEGATIVE   Leukocytes, UA SMALL (A) NEGATIVE   Plan:     Initial labs drawn. Prenatal vitamins. Problem list reviewed and updated. Genetic Screening discussed First Screen: ordered.  Ultrasound discussed; fetal survey: ordered.  Follow up in 4 weeks. 50% of 30 min visit spent on counseling and coordination of care.  Early glucola due to weight   [place] Beckford Grissett 01/09/2016

## 2016-01-09 NOTE — Addendum Note (Signed)
Addended by: Jill SideAY, Breslin Hemann L on: 01/09/2016 02:54 PM   Modules accepted: Orders

## 2016-01-09 NOTE — Progress Notes (Signed)
Here for initial prenatal visit. Given new pregnancy information. C/o cramps at night.

## 2016-01-09 NOTE — Progress Notes (Signed)
Bedside US for viability - Twin IUP, separating membrane visualized.  FHR A - 158 bpm, FHR B - 164 bpm per PW doppler

## 2016-01-10 ENCOUNTER — Encounter (HOSPITAL_COMMUNITY): Payer: Self-pay | Admitting: Certified Nurse Midwife

## 2016-01-10 LAB — GLUCOSE TOLERANCE, 1 HOUR (50G) W/O FASTING: Glucose, 1 Hr, gestational: 74 mg/dL (ref <140)

## 2016-01-11 LAB — PRESCRIPTION MONITORING PROFILE (19 PANEL)
Amphetamine/Meth: NEGATIVE ng/mL
Barbiturate Screen, Urine: NEGATIVE ng/mL
Benzodiazepine Screen, Urine: NEGATIVE ng/mL
Buprenorphine, Urine: NEGATIVE ng/mL
Cannabinoid Scrn, Ur: NEGATIVE ng/mL
Carisoprodol, Urine: NEGATIVE ng/mL
Cocaine Metabolites: NEGATIVE ng/mL
Creatinine, Urine: 235.7 mg/dL (ref >20.0)
Fentanyl, Ur: NEGATIVE ng/mL
MDMA URINE: NEGATIVE ng/mL
Meperidine, Ur: NEGATIVE ng/mL
Methadone Screen, Urine: NEGATIVE ng/mL
Methaqualone: NEGATIVE ng/mL
Nitrites, Initial: NEGATIVE ug/mL
Opiate Screen, Urine: NEGATIVE ng/mL
Oxycodone Screen, Ur: NEGATIVE ng/mL
Phencyclidine, Ur: NEGATIVE ng/mL
Propoxyphene: NEGATIVE ng/mL
Tapentadol, urine: NEGATIVE ng/mL
Tramadol Scrn, Ur: NEGATIVE ng/mL
Zolpidem, Urine: NEGATIVE ng/mL
pH, Initial: 6.7 pH (ref 4.5–8.9)

## 2016-01-11 LAB — PRENATAL PROFILE (SOLSTAS)
Antibody Screen: NEGATIVE
Basophils Absolute: 0 cells/uL (ref 0–200)
Basophils Relative: 0 %
Eosinophils Absolute: 240 cells/uL (ref 15–500)
Eosinophils Relative: 4 %
HCT: 34.6 % — ABNORMAL LOW (ref 35.0–45.0)
HIV 1&2 Ab, 4th Generation: NONREACTIVE
Hemoglobin: 11.7 g/dL (ref 11.7–15.5)
Hepatitis B Surface Ag: NEGATIVE
Lymphocytes Relative: 27 %
Lymphs Abs: 1620 cells/uL (ref 850–3900)
MCH: 30.5 pg (ref 27.0–33.0)
MCHC: 33.8 g/dL (ref 32.0–36.0)
MCV: 90.3 fL (ref 80.0–100.0)
MPV: 8.8 fL (ref 7.5–12.5)
Monocytes Absolute: 660 cells/uL (ref 200–950)
Monocytes Relative: 11 %
Neutro Abs: 3480 cells/uL (ref 1500–7800)
Neutrophils Relative %: 58 %
Platelets: 337 10*3/uL (ref 140–400)
RBC: 3.83 MIL/uL (ref 3.80–5.10)
RDW: 13.5 % (ref 11.0–15.0)
Rh Type: POSITIVE
Rubella: 11.3 Index — ABNORMAL HIGH (ref <0.90)
WBC: 6 10*3/uL (ref 3.8–10.8)

## 2016-01-11 LAB — HEMOGLOBINOPATHY EVALUATION
Hemoglobin Other: 0 %
Hgb A2 Quant: 3 % (ref 1.8–3.5)
Hgb A: 97 % (ref >96.0)
Hgb F Quant: 0 % (ref <2.0)
Hgb S Quant: 0 %

## 2016-01-11 LAB — CULTURE, OB URINE: Colony Count: >=100000

## 2016-01-12 LAB — PAIN MGMT, BUP CONF W/ NALOX, U: Buprenorphine: NEGATIVE ng/mL (ref <5)

## 2016-01-12 LAB — PAIN MGMT, COCAINE MET. W/CONF, U: Cocaine Metabolite: NEGATIVE ng/mL (ref <150)

## 2016-01-12 LAB — PAIN MGMT, BENZOS W/CONF, U: Benzodiazepines: NEGATIVE ng/mL (ref <100)

## 2016-01-12 LAB — PAIN MGMT, OPIATES W/CONF, U: Opiates: NEGATIVE ng/mL (ref <100)

## 2016-01-12 LAB — PAIN MGMT, AMPHETAM. W/CONF, U: Amphetamines: NEGATIVE ng/mL (ref <500)

## 2016-01-12 LAB — PAIN MGMT, METHADONE W/CONF, U: Methadone Metabolite: NEGATIVE ng/mL (ref <100)

## 2016-01-12 LAB — PAIN MGMT, OXYCODONE W/CONF, U: Oxycodone: NEGATIVE ng/mL (ref <100)

## 2016-01-12 LAB — PAIN MGMT, HEROIN MET. W/CONF, U: 6 Acetylmorphine: NEGATIVE ng/mL (ref <10)

## 2016-01-12 LAB — PAIN MGMT, MARIJUANA W/CONF, U: Marijuana Metabolite: NEGATIVE ng/mL (ref <20)

## 2016-01-12 LAB — PAIN MGMT, PROPOXYPHENE W/CONF, U: Propoxyphene: NEGATIVE ng/mL (ref <300)

## 2016-01-12 LAB — CYTOLOGY - PAP

## 2016-01-12 LAB — PAIN MGMT, BARBITURATES W/CONF, U: Barbiturates: NEGATIVE ng/mL (ref <300)

## 2016-01-13 LAB — PAIN MGMT, TRAMADOL QN, U
Desmethyltramadol: NEGATIVE ng/mL (ref <100)
Tramadol: NEGATIVE ng/mL (ref <100)

## 2016-01-13 LAB — PAIN MGMT, CARISOPRODOL MET. QN, U: Meprobamate: NEGATIVE ng/mL (ref <1000)

## 2016-01-13 LAB — ZOLPIDEM QN, U
ZOLPIDEM METABOLITE: NEGATIVE ng/mL (ref <5)
ZOLPIDEM: NEGATIVE ng/mL (ref <5)

## 2016-01-13 LAB — PAIN MGMT, FENTANYL QN, U
Fentanyl: NEGATIVE ng/mL (ref <0.5)
Norfentanyl: NEGATIVE ng/mL (ref <0.5)

## 2016-01-13 LAB — PAIN MGMT, MEPERIDINE QN, U
Meperidine: NEGATIVE ng/mL (ref <100)
Normeperidine: NEGATIVE ng/mL (ref <100)

## 2016-01-16 ENCOUNTER — Encounter (HOSPITAL_COMMUNITY): Payer: Self-pay

## 2016-01-17 ENCOUNTER — Encounter: Payer: Self-pay | Admitting: General Practice

## 2016-01-17 ENCOUNTER — Ambulatory Visit (HOSPITAL_COMMUNITY): Admission: RE | Admit: 2016-01-17 | Payer: Medicaid Other | Source: Ambulatory Visit

## 2016-01-17 ENCOUNTER — Ambulatory Visit (HOSPITAL_COMMUNITY): Payer: Medicaid Other

## 2016-01-19 ENCOUNTER — Encounter (HOSPITAL_COMMUNITY): Payer: Self-pay

## 2016-01-19 ENCOUNTER — Ambulatory Visit (HOSPITAL_COMMUNITY)
Admission: RE | Admit: 2016-01-19 | Discharge: 2016-01-19 | Disposition: A | Discharge status: Home / Self Care | Payer: Medicaid Other | Source: Ambulatory Visit | Attending: Certified Nurse Midwife | Admitting: Certified Nurse Midwife

## 2016-01-19 VITALS — BP 118/63 | HR 91 | Wt 194.4 lb

## 2016-01-19 DIAGNOSIS — O30049 Twin pregnancy, dichorionic/diamniotic, unspecified trimester: Secondary | ICD-10-CM

## 2016-01-19 DIAGNOSIS — O30001 Twin pregnancy, unspecified number of placenta and unspecified number of amniotic sacs, first trimester: Secondary | ICD-10-CM

## 2016-01-19 DIAGNOSIS — Z3A13 13 weeks gestation of pregnancy: Secondary | ICD-10-CM | POA: Diagnosis not present

## 2016-01-19 DIAGNOSIS — O30041 Twin pregnancy, dichorionic/diamniotic, first trimester: Secondary | ICD-10-CM | POA: Insufficient documentation

## 2016-01-26 ENCOUNTER — Other Ambulatory Visit (HOSPITAL_COMMUNITY): Payer: Self-pay

## 2016-02-07 ENCOUNTER — Encounter (HOSPITAL_COMMUNITY): Payer: Self-pay | Admitting: *Deleted

## 2016-02-07 ENCOUNTER — Emergency Department (HOSPITAL_COMMUNITY)
Admission: EM | Admit: 2016-02-07 | Discharge: 2016-02-07 | Disposition: A | Discharge status: Home / Self Care | Payer: Medicaid Other | Attending: Emergency Medicine | Admitting: Emergency Medicine

## 2016-02-07 ENCOUNTER — Emergency Department (HOSPITAL_COMMUNITY): Payer: Medicaid Other

## 2016-02-07 ENCOUNTER — Ambulatory Visit (INDEPENDENT_AMBULATORY_CARE_PROVIDER_SITE_OTHER): Payer: Medicaid Other | Admitting: Advanced Practice Midwife

## 2016-02-07 VITALS — BP 111/64 | HR 72 | Wt 197.5 lb

## 2016-02-07 DIAGNOSIS — O34219 Maternal care for unspecified type scar from previous cesarean delivery: Secondary | ICD-10-CM | POA: Diagnosis not present

## 2016-02-07 DIAGNOSIS — Z98891 History of uterine scar from previous surgery: Secondary | ICD-10-CM

## 2016-02-07 DIAGNOSIS — O09212 Supervision of pregnancy with history of pre-term labor, second trimester: Secondary | ICD-10-CM

## 2016-02-07 DIAGNOSIS — O30042 Twin pregnancy, dichorionic/diamniotic, second trimester: Secondary | ICD-10-CM | POA: Diagnosis not present

## 2016-02-07 DIAGNOSIS — Y9389 Activity, other specified: Secondary | ICD-10-CM | POA: Diagnosis not present

## 2016-02-07 DIAGNOSIS — Y999 Unspecified external cause status: Secondary | ICD-10-CM | POA: Insufficient documentation

## 2016-02-07 DIAGNOSIS — W1839XA Other fall on same level, initial encounter: Secondary | ICD-10-CM | POA: Insufficient documentation

## 2016-02-07 DIAGNOSIS — Z3A16 16 weeks gestation of pregnancy: Secondary | ICD-10-CM | POA: Diagnosis not present

## 2016-02-07 DIAGNOSIS — Y929 Unspecified place or not applicable: Secondary | ICD-10-CM | POA: Diagnosis not present

## 2016-02-07 DIAGNOSIS — O09292 Supervision of pregnancy with other poor reproductive or obstetric history, second trimester: Secondary | ICD-10-CM | POA: Diagnosis not present

## 2016-02-07 DIAGNOSIS — S6991XA Unspecified injury of right wrist, hand and finger(s), initial encounter: Secondary | ICD-10-CM | POA: Diagnosis present

## 2016-02-07 DIAGNOSIS — O26892 Other specified pregnancy related conditions, second trimester: Secondary | ICD-10-CM | POA: Insufficient documentation

## 2016-02-07 DIAGNOSIS — O09892 Supervision of other high risk pregnancies, second trimester: Secondary | ICD-10-CM

## 2016-02-07 LAB — POCT URINALYSIS DIP (DEVICE)
BILIRUBIN URINE: NEGATIVE
Glucose, UA: NEGATIVE mg/dL
HGB URINE DIPSTICK: NEGATIVE
KETONES UR: NEGATIVE mg/dL
NITRITE: NEGATIVE
Protein, ur: NEGATIVE mg/dL
Specific Gravity, Urine: 1.025 (ref 1.005–1.030)
Urobilinogen, UA: 1 mg/dL (ref 0.0–1.0)
pH: 7 (ref 5.0–8.0)

## 2016-02-07 MED ORDER — ACETAMINOPHEN 325 MG PO TABS
650.0000 mg | ORAL_TABLET | Freq: Once | ORAL | Status: AC
  Administered 2016-02-07: 650 mg via ORAL
  Filled 2016-02-07: qty 2

## 2016-02-07 NOTE — ED Provider Notes (Signed)
CSN: [id number]     Arrival date & time 02/07/16  1332 History   By signing my name below, I, [name], attest that this documentation has been prepared under the direction and in the presence of non-physician practitioner, Trixie DredgeEmily Ariq Khamis, PA-C. Electronically Signed: [name], Scribe. 02/07/2016. 3:10 PM.   Chief Complaint  Patient presents with  . Finger Injury    right index   The history is provided by the patient. No language interpreter was used.   HPI Comments:  Maria Olson is a 25 y.o. female who presents to the Emergency Department s/p fall yesterday complaining of moderate right index finger swelling and pain onset last night. Pt states she fell in bed, tried to catch herself with her right hand and bent her index finger in towards her other fingers. She states she felt a pop and the finger was sore last night. Pt is having difficulty moving the finger secondary to pain and swelling. No alleviating factors noted. Pt is right handed. She is currently [redacted] week pregnant. Denies head injury or syncope, abdominal pain, abnormal vaginal bleeding or discharge.  Denies any other injuries.  Denies any complication or concern with the current pregnancy.    Past Medical History  Diagnosis Date  . Preterm labor   . Infection     UTI  . Ovarian cyst   . Anemia    Past Surgical History  Procedure Laterality Date  . Cesarean section    . Induced abortion     Family History  Problem Relation Age of Onset  . Asthma Brother   . Asthma Daughter   . Cancer Paternal Grandmother    Social History  Substance Use Topics  . Smoking status: Never Smoker   . Smokeless tobacco: Never Used  . Alcohol Use: No   OB History    Gravida Para Term Preterm AB TAB SAB Ectopic Multiple Living   7 3 2 1 3 2 1  0 0 3     Review of Systems  Constitutional: Negative for activity change and appetite change.  Gastrointestinal: Negative for abdominal pain.  Genitourinary: Negative for  vaginal bleeding, vaginal discharge and pelvic pain.  Musculoskeletal: Positive for joint swelling and arthralgias.  Skin: Negative for color change and pallor.  Neurological: Negative for syncope.  Hematological: Does not bruise/bleed easily.  Psychiatric/Behavioral: Negative for self-injury.    Allergies  Review of patient's allergies indicates no known allergies.  Home Medications   Prior to Admission medications   Medication Sig Start Date End Date Taking? Authorizing Provider  acetaminophen (TYLENOL) 500 MG tablet Take 1,000 mg by mouth every 6 (six) hours as needed for mild pain or headache.    Historical Provider, MD  Prenatal Vit-Fe Fumarate-FA (PRENATAL COMPLETE) 14-0.4 MG TABS Take 1 tablet by mouth 1 day or 1 dose. 11/14/15   Jean RosenthalSusan P Lineberry, NP  promethazine (PHENERGAN) 25 MG tablet Take 0.5 tablets (12.5 mg total) by mouth every 6 (six) hours as needed for nausea or vomiting. 11/28/15   Dorathy KinsmanVirginia Smith, CNM   BP 111/66 mmHg  Pulse 87  Temp(Src) 98.7 F (37.1 C) (Oral)  Resp 18  SpO2 100%  LMP 10/16/2015   Physical Exam  Constitutional: She appears well-developed and well-nourished. No distress.  HENT:  Head: Normocephalic and atraumatic.  Neck: Neck supple.  Pulmonary/Chest: Effort normal.  Musculoskeletal:       Right wrist: Normal.       Right forearm: Normal.  Right hand: She exhibits decreased range of motion, tenderness and swelling. She exhibits normal capillary refill, no deformity and no laceration.  Right hand:  Second finger with edema and tenderness around PIP.  No break in skin or skin change.  Pt is able to flex at each joint, slightly decreased secondary to edema.  Distal sensation intact, capillary refill < 2 seconds.  No other bony tenderness or edema associated with the right hand.   Neurological: She is alert.  Skin: She is not diaphoretic.  Nursing note and vitals reviewed.   ED Course  Procedures  DIAGNOSTIC STUDIES:  Oxygen  Saturation is 100% on RA, normal by my interpretation.    COORDINATION OF CARE:  3:08 PM Recommended ice and elevation. Will splint finger. Discussed treatment plan with pt at bedside and pt agreed to plan.  Labs Review Labs Reviewed - No data to display  Imaging Review Dg Finger Index Right  02/07/2016  CLINICAL DATA:  Status post fall last night with a right index finger injury. Pain and swelling. Initial encounter. EXAM: RIGHT INDEX FINGER 2+V COMPARISON:  None. FINDINGS: Soft tissues of the right index finger appear swollen. No bony or joint abnormality is seen. No radiopaque foreign body is identified. IMPRESSION: Swelling of the right index finger. Negative for bony or joint abnormality. Electronically Signed   By: Drusilla Kanner M.D.   On: 02/07/2016 14:07   I have personally reviewed and evaluated these images and lab results as part of my medical decision-making.   EKG Interpretation None      MDM   Final diagnoses:  Finger injury, right, initial encounter   Afebrile, nontoxic patient with injury to her right index finger while falling on her hand while on her bed.  Denies any other injury.  Has some edema and tenderness of PIP of index finger, right hand.   Xray negative.   D/C home with finger splint, PCP follow up.  Discussed result, findings, treatment, and follow up  with patient.  Pt given return precautions.  Pt verbalizes understanding and agrees with plan.       I personally performed the services described in this documentation, which was scribed in my presence. The recorded information has been reviewed and is accurate.    Trixie Dredge, PA-C 02/07/16 1553  Raeford Razor, MD 02/09/16 2200

## 2016-02-07 NOTE — Progress Notes (Signed)
Subjective:  Maria Olson is a [age] y.o. [phone] at 2410w2d being seen today for ongoing prenatal care.  She is currently monitored for the following issues for this high-risk pregnancy and has Supervision of other high risk pregnancy, antepartum; Dichorionic diamniotic twin pregnancy, antepartum; Hx successful VBAC (vaginal birth after cesarean), currently pregnant; Hx of cesarean section; Hx of fetal anomaly in prior pregnancy, currently pregnant; and Hx of preterm delivery, currently pregnant on her problem list.  Patient reports no complaints.  Contractions: Not present. Vag. Bleeding: None.  Movement: Present. Denies leaking of fluid.   The following portions of the patient's history were reviewed and updated as appropriate: allergies, current medications, past family history, past medical history, past social history, past surgical history and problem list. Problem list updated.  Objective:   Filed Vitals:   02/07/16 0926  BP: 111/64  Pulse: 72  Weight: 197 lb 8 oz (89.585 kg)    Fetal Status: Fetal Heart Rate (bpm): 147/149   Movement: Present     General:  Alert, oriented and cooperative. Patient is in no acute distress.  Skin: Skin is warm and dry. No rash noted.   Cardiovascular: Normal heart rate noted  Respiratory: Normal respiratory effort, no problems with respiration noted  Abdomen: Soft, gravid, appropriate for gestational age. Pain/Pressure: Present     Pelvic: Cervical exam deferred        Extremities: Normal range of motion.     Mental Status: Normal mood and affect. Normal behavior. Normal judgment and thought content.   Urinalysis:    Neg  Assessment and Plan:  Pregnancy: [id number] at 6010w2d  1. Hx of preterm delivery, currently pregnant, second trimester   2. Dichorionic diamniotic twin pregnancy in second trimester  - Alpha fetoprotein, maternal - Anatomy scan scheduled 02/23/16  3. Supervision of other high risk pregnancy, antepartum, second  trimester   4. Hx of cesarean section   5. Hx of fetal anomaly in prior pregnancy, currently pregnant, second trimester   Preterm labor symptoms and general obstetric precautions including but not limited to vaginal bleeding, contractions, leaking of fluid and fetal movement were reviewed in detail with the patient. Please refer to After Visit Summary for other counseling recommendations.  Return in about 4 weeks (around 03/06/2016).   Dorathy KinsmanVirginia Trystan Eads, CNM

## 2016-02-07 NOTE — Discharge Instructions (Signed)
Read the information below.  You may return to the Emergency Department at any time for worsening condition or any new symptoms that concern you.  If you develop uncontrolled pain, weakness or numbness of the extremity, severe discoloration of the skin, or you are unable to move your finger, return to the ER for a recheck.    °

## 2016-02-07 NOTE — ED Notes (Signed)
Patient fell last night, landing on right hand.  Patient now presents with swollen right index finger and cannot bend or move without pain.

## 2016-02-08 LAB — ALPHA FETOPROTEIN, MATERNAL
AFP: 54.5 ng/mL
Curr Gest Age: 16.3 weeks
MoM for AFP: 1.63
Open Spina bifida: NEGATIVE
Osb Risk: 1:7690 {titer}

## 2016-02-22 ENCOUNTER — Encounter (HOSPITAL_COMMUNITY): Payer: Self-pay

## 2016-02-23 ENCOUNTER — Ambulatory Visit (HOSPITAL_COMMUNITY): Payer: Medicaid Other

## 2016-02-23 ENCOUNTER — Other Ambulatory Visit (HOSPITAL_COMMUNITY): Payer: Self-pay

## 2016-02-23 ENCOUNTER — Encounter (HOSPITAL_COMMUNITY): Payer: Self-pay

## 2016-02-23 ENCOUNTER — Ambulatory Visit (HOSPITAL_COMMUNITY)
Admission: RE | Admit: 2016-02-23 | Discharge: 2016-02-23 | Disposition: A | Discharge status: Home / Self Care | Payer: Medicaid Other | Source: Ambulatory Visit | Attending: Certified Nurse Midwife | Admitting: Certified Nurse Midwife

## 2016-02-23 ENCOUNTER — Other Ambulatory Visit (HOSPITAL_COMMUNITY): Payer: Self-pay | Admitting: Maternal and Fetal Medicine

## 2016-02-23 DIAGNOSIS — O30049 Twin pregnancy, dichorionic/diamniotic, unspecified trimester: Secondary | ICD-10-CM

## 2016-02-23 DIAGNOSIS — Z3A18 18 weeks gestation of pregnancy: Secondary | ICD-10-CM

## 2016-02-23 DIAGNOSIS — O34219 Maternal care for unspecified type scar from previous cesarean delivery: Secondary | ICD-10-CM | POA: Diagnosis not present

## 2016-02-23 DIAGNOSIS — Z8279 Family history of other congenital malformations, deformations and chromosomal abnormalities: Secondary | ICD-10-CM | POA: Diagnosis not present

## 2016-02-23 DIAGNOSIS — O09212 Supervision of pregnancy with history of pre-term labor, second trimester: Secondary | ICD-10-CM | POA: Insufficient documentation

## 2016-02-23 DIAGNOSIS — O30042 Twin pregnancy, dichorionic/diamniotic, second trimester: Secondary | ICD-10-CM | POA: Insufficient documentation

## 2016-02-23 DIAGNOSIS — Z36 Encounter for antenatal screening of mother: Secondary | ICD-10-CM | POA: Insufficient documentation

## 2016-02-23 DIAGNOSIS — O09892 Supervision of other high risk pregnancies, second trimester: Secondary | ICD-10-CM

## 2016-02-23 DIAGNOSIS — Z3689 Encounter for other specified antenatal screening: Secondary | ICD-10-CM

## 2016-02-27 ENCOUNTER — Ambulatory Visit (HOSPITAL_COMMUNITY): Payer: Medicaid Other

## 2016-03-06 ENCOUNTER — Ambulatory Visit (INDEPENDENT_AMBULATORY_CARE_PROVIDER_SITE_OTHER): Payer: Medicaid Other | Admitting: Advanced Practice Midwife

## 2016-03-06 ENCOUNTER — Encounter: Payer: Self-pay | Admitting: Advanced Practice Midwife

## 2016-03-06 VITALS — BP 121/61 | HR 79 | Wt 205.4 lb

## 2016-03-06 DIAGNOSIS — O30049 Twin pregnancy, dichorionic/diamniotic, unspecified trimester: Secondary | ICD-10-CM

## 2016-03-06 DIAGNOSIS — O30042 Twin pregnancy, dichorionic/diamniotic, second trimester: Secondary | ICD-10-CM

## 2016-03-06 DIAGNOSIS — O34219 Maternal care for unspecified type scar from previous cesarean delivery: Secondary | ICD-10-CM | POA: Diagnosis not present

## 2016-03-06 DIAGNOSIS — O09892 Supervision of other high risk pregnancies, second trimester: Secondary | ICD-10-CM

## 2016-03-06 LAB — POCT URINALYSIS DIP (DEVICE)
BILIRUBIN URINE: NEGATIVE
GLUCOSE, UA: NEGATIVE mg/dL
Hgb urine dipstick: NEGATIVE
Ketones, ur: NEGATIVE mg/dL
NITRITE: NEGATIVE
Protein, ur: NEGATIVE mg/dL
Specific Gravity, Urine: 1.02 (ref 1.005–1.030)
UROBILINOGEN UA: 0.2 mg/dL (ref 0.0–1.0)
pH: 6.5 (ref 5.0–8.0)

## 2016-03-06 NOTE — Progress Notes (Signed)
Subjective:  Maria Olson is a 25 y.o. [phone] at 5245w2d being seen today for ongoing prenatal care.  She is currently monitored for the following issues for this high-risk pregnancy and has Supervision of other high risk pregnancy, antepartum; Dichorionic diamniotic twin pregnancy, antepartum; Hx successful VBAC (vaginal birth after cesarean), currently pregnant; Hx of cesarean section; Hx of fetal anomaly in prior pregnancy, currently pregnant; and Hx of preterm delivery, currently pregnant on her problem list.  Patient reports pelvic pressure when squatting and pelvic "popping" at night in bed. .  Contractions: Not present. Vag. Bleeding: None.  Movement: Present. Denies leaking of fluid.   The following portions of the patient's history were reviewed and updated as appropriate: allergies, current medications, past family history, past medical history, past social history, past surgical history and problem list. Problem list updated.  Objective:   Filed Vitals:   03/06/16 0943  BP: 121/61  Pulse: 79  Weight: 205 lb 6.4 oz (93.169 kg)    Fetal Status: Fetal Heart Rate (bpm): 145/140   Movement: Present     General:  Alert, oriented and cooperative. Patient is in no acute distress.  Skin: Skin is warm and dry. No rash noted.   Cardiovascular: Normal heart rate noted  Respiratory: Normal respiratory effort, no problems with respiration noted  Abdomen: Soft, gravid, appropriate for gestational age. Pain/Pressure: Absent     Pelvic:  Cervical exam declined        Extremities: Normal range of motion.  Edema: Trace  Mental Status: Normal mood and affect. Normal behavior. Normal judgment and thought content.   Urinalysis: Urine Protein: Negative Urine Glucose: Negative  Anatomy scan showed concordant growth, female x 2, CL 3.7 cm  Assessment and Plan:  Pregnancy: [id number] at 1745w2d  1. Supervision of other high risk pregnancy, antepartum, second trimester  Growth US scheduled   2.  Pelvic pain pregnancy - Maternity support belt - PTL precautions - Comfort measures  Preterm labor symptoms and general obstetric precautions including but not limited to vaginal bleeding, contractions, leaking of fluid and fetal movement were reviewed in detail with the patient. Please refer to After Visit Summary for other counseling recommendations.  F/U 4 weeks BTL consent signed   Dorathy KinsmanVirginia Kourtlyn Charlet, CNM

## 2016-03-06 NOTE — Patient Instructions (Signed)

## 2016-03-06 NOTE — Progress Notes (Signed)
Wants to sign BTL papers

## 2016-03-11 ENCOUNTER — Encounter: Payer: Self-pay | Admitting: *Deleted

## 2016-03-21 ENCOUNTER — Encounter (HOSPITAL_COMMUNITY): Payer: Self-pay

## 2016-03-22 ENCOUNTER — Encounter (HOSPITAL_COMMUNITY): Payer: Self-pay

## 2016-03-22 ENCOUNTER — Ambulatory Visit (HOSPITAL_COMMUNITY)
Admission: RE | Admit: 2016-03-22 | Discharge: 2016-03-22 | Disposition: A | Discharge status: Home / Self Care | Payer: Medicaid Other | Source: Ambulatory Visit | Attending: Certified Nurse Midwife | Admitting: Certified Nurse Midwife

## 2016-03-22 DIAGNOSIS — O30049 Twin pregnancy, dichorionic/diamniotic, unspecified trimester: Secondary | ICD-10-CM

## 2016-03-22 DIAGNOSIS — O09212 Supervision of pregnancy with history of pre-term labor, second trimester: Secondary | ICD-10-CM | POA: Insufficient documentation

## 2016-03-22 DIAGNOSIS — Z8279 Family history of other congenital malformations, deformations and chromosomal abnormalities: Secondary | ICD-10-CM | POA: Insufficient documentation

## 2016-03-22 DIAGNOSIS — Z3A22 22 weeks gestation of pregnancy: Secondary | ICD-10-CM | POA: Insufficient documentation

## 2016-03-22 DIAGNOSIS — O34219 Maternal care for unspecified type scar from previous cesarean delivery: Secondary | ICD-10-CM | POA: Diagnosis not present

## 2016-03-22 DIAGNOSIS — O30042 Twin pregnancy, dichorionic/diamniotic, second trimester: Secondary | ICD-10-CM | POA: Diagnosis not present

## 2016-03-30 ENCOUNTER — Inpatient Hospital Stay (HOSPITAL_COMMUNITY): Payer: Medicaid Other

## 2016-03-30 ENCOUNTER — Encounter (HOSPITAL_COMMUNITY): Payer: Self-pay | Admitting: *Deleted

## 2016-03-30 ENCOUNTER — Inpatient Hospital Stay (HOSPITAL_COMMUNITY)
Admission: AD | Admit: 2016-03-30 | Discharge: 2016-03-30 | Disposition: A | Discharge status: Home / Self Care | Payer: Medicaid Other | Source: Ambulatory Visit | Attending: Obstetrics & Gynecology | Admitting: Obstetrics & Gynecology

## 2016-03-30 DIAGNOSIS — R102 Pelvic and perineal pain: Secondary | ICD-10-CM | POA: Insufficient documentation

## 2016-03-30 DIAGNOSIS — Z9889 Other specified postprocedural states: Secondary | ICD-10-CM | POA: Diagnosis not present

## 2016-03-30 DIAGNOSIS — O26892 Other specified pregnancy related conditions, second trimester: Secondary | ICD-10-CM | POA: Insufficient documentation

## 2016-03-30 DIAGNOSIS — R109 Unspecified abdominal pain: Secondary | ICD-10-CM

## 2016-03-30 DIAGNOSIS — O26899 Other specified pregnancy related conditions, unspecified trimester: Secondary | ICD-10-CM

## 2016-03-30 DIAGNOSIS — O4702 False labor before 37 completed weeks of gestation, second trimester: Secondary | ICD-10-CM

## 2016-03-30 DIAGNOSIS — N949 Unspecified condition associated with female genital organs and menstrual cycle: Secondary | ICD-10-CM

## 2016-03-30 DIAGNOSIS — O3482 Maternal care for other abnormalities of pelvic organs, second trimester: Secondary | ICD-10-CM | POA: Insufficient documentation

## 2016-03-30 DIAGNOSIS — Z79899 Other long term (current) drug therapy: Secondary | ICD-10-CM | POA: Insufficient documentation

## 2016-03-30 DIAGNOSIS — Z3A23 23 weeks gestation of pregnancy: Secondary | ICD-10-CM | POA: Insufficient documentation

## 2016-03-30 DIAGNOSIS — O9989 Other specified diseases and conditions complicating pregnancy, childbirth and the puerperium: Secondary | ICD-10-CM

## 2016-03-30 LAB — URINALYSIS, ROUTINE W REFLEX MICROSCOPIC
BILIRUBIN URINE: NEGATIVE
Glucose, UA: NEGATIVE mg/dL
HGB URINE DIPSTICK: NEGATIVE
KETONES UR: NEGATIVE mg/dL
Leukocytes, UA: NEGATIVE
NITRITE: NEGATIVE
PROTEIN: NEGATIVE mg/dL
SPECIFIC GRAVITY, URINE: 1.025 (ref 1.005–1.030)
pH: 6 (ref 5.0–8.0)

## 2016-03-30 LAB — FETAL FIBRONECTIN: Fetal Fibronectin: NEGATIVE

## 2016-03-30 MED ORDER — LACTATED RINGERS IV BOLUS (SEPSIS): 1000.0000 mL | Freq: Once | INTRAVENOUS | Status: DC

## 2016-03-30 NOTE — MAU Note (Signed)
Pt states she started feeling pressure from Baby A last night.  Pt states this morning she is having some vaginal pain and cramping.  Pt denies bleeding or discharge.  Pt denies any complications this pregnancy.

## 2016-03-30 NOTE — Discharge Instructions (Signed)
Round Ligament Pain The round ligament is a cord of muscle and tissue that helps to support the uterus. It can become a source of pain during pregnancy if it becomes stretched or twisted as the baby grows. The pain usually begins in the second trimester of pregnancy, and it can come and go until the baby is delivered. It is not a serious problem, and it does not cause harm to the baby. Round ligament pain is usually a short, sharp, and pinching pain, but it can also be a dull, lingering, and aching pain. The pain is felt in the lower side of the abdomen or in the groin. It usually starts deep in the groin and moves up to the outside of the hip area. Pain can occur with:  A sudden change in position.  Rolling over in bed.  Coughing or sneezing.  Physical activity. HOME CARE INSTRUCTIONS Watch your condition for any changes. Take these steps to help with your pain:  When the pain starts, relax. Then try:  Sitting down.  Flexing your knees up to your abdomen.  Lying on your side with one pillow under your abdomen and another pillow between your legs.  Sitting in a warm bath for 15-20 minutes or until the pain goes away.  Take over-the-counter and prescription medicines only as told by your health care provider.  Move slowly when you sit and stand.  Avoid long walks if they cause pain.  Stop or lessen your physical activities if they cause pain. SEEK MEDICAL CARE IF:  Your pain does not go away with treatment.  You feel pain in your back that you did not have before.  Your medicine is not helping. SEEK IMMEDIATE MEDICAL CARE IF:  You develop a fever or chills.  You develop uterine contractions.  You develop vaginal bleeding.  You develop nausea or vomiting.  You develop diarrhea.  You have pain when you urinate.   This information is not intended to replace advice given to you by your health care provider. Make sure you discuss any questions you have with your health  care provider.   Document Released: 05/14/2008 Document Revised: 10/28/2011 Document Reviewed: 10/12/2014 Elsevier Interactive Patient Education 2016 [employer] Preterm Labor Information Preterm labor is when labor starts before you are [redacted] weeks pregnant. The normal length of pregnancy is 39 to 41 weeks.  CAUSES  The cause of preterm labor is not often known. The most common known cause is infection. RISK FACTORS  Having a history of preterm labor.  Having your water break before it should.  Having a placenta that covers the opening of the cervix.  Having a placenta that breaks away from the uterus.  Having a cervix that is too weak to hold the baby in the uterus.  Having too much fluid in the amniotic sac.  Taking drugs or smoking while pregnant.  Not gaining enough weight while pregnant.  Being younger than 6718 and older than 23 years old.  Having a low income.  Being African American. SYMPTOMS  Period-like cramps, belly (abdominal) pain, or back pain.  Contractions that are regular, as often as six in an hour. They may be mild or painful.  Contractions that start at the top of the belly. They then move to the lower belly and back.  Lower belly pressure that seems to get stronger.  Bleeding from the vagina.  Fluid leaking from the vagina. TREATMENT  Treatment depends on:  Your condition.  The condition of your  baby. °· How many weeks pregnant you are. °Your doctor may have you: °· Take medicine to stop contractions. °· Stay in bed except to use the restroom (bed rest). °· Stay in the hospital. °WHAT SHOULD YOU DO IF YOU THINK YOU ARE IN PRETERM LABOR? °Call your doctor right away. You need to go to the hospital right away.  °HOW CAN YOU PREVENT PRETERM LABOR IN FUTURE PREGNANCIES? °· Stop smoking, if you smoke. °· Maintain healthy weight gain. °· Do not take drugs or be around chemicals that are not needed. °· Tell your doctor if you think you have an  infection. °· Tell your doctor if you had a preterm labor before. °  °This information is not intended to replace advice given to you by your health care provider. Make sure you discuss any questions you have with your health care provider. °  °Document Released: 11/01/2008 Document Revised: 12/20/2014 Document Reviewed: 09/07/2012 °Elsevier Interactive Patient Education ©2016 Elsevier Inc. ° °

## 2016-03-30 NOTE — MAU Provider Note (Signed)
History     CSN: [id number]  Arrival date and time: 03/30/16 [id number]   First Provider Initiated Contact with Patient 03/30/16 1121      Chief Complaint  Patient presents with  . Abdominal Pain  . Vaginal Pain   HPI Maria Olson is a 25 y.o. [id number] at 5061w5d with di/di twins who presents abdominal & vaginal pain. Symptoms began this morning. Reports sharp vaginal pain while walking; pain is not present when she's not walking. Associated with vaginal pressure. Also feels lower abdominal cramping that comes & goes. Began shortly PTA. Rates pain 4/10. Has not treated. Denies vaginal bleeding, vaginal discharge, or LOF. Denies n/v/d, dysuria, or constipation.  Positive fetal movement.   OB History    Gravida Para Term Preterm AB Living   7 3 2 1 3 3    SAB TAB Ectopic Multiple Live Births   1 2 0 0 3      Past Medical History:  Diagnosis Date  . Anemia   . Infection    UTI  . Ovarian cyst   . Preterm labor     Past Surgical History:  Procedure Laterality Date  . CESAREAN SECTION    . INDUCED ABORTION      Family History  Problem Relation Age of Onset  . Asthma Brother   . Cancer Paternal Grandmother   . Asthma Daughter     Social History  Substance Use Topics  . Smoking status: Never Smoker  . Smokeless tobacco: Never Used  . Alcohol use No    Allergies: No Known Allergies  Prescriptions Prior to Admission  Medication Sig Dispense Refill Last Dose  . acetaminophen (TYLENOL) 500 MG tablet Take 1,000 mg by mouth every 6 (six) hours as needed for mild pain, moderate pain or headache.    Past Month at Unknown time  . Prenatal Vit-Fe Fumarate-FA (PRENATAL MULTIVITAMIN) TABS tablet Take 1 tablet by mouth daily.   03/29/2016 at Unknown time  . promethazine (PHENERGAN) 25 MG tablet Take 0.5 tablets (12.5 mg total) by mouth every 6 (six) hours as needed for nausea or vomiting. 30 tablet 2 03/29/2016 at Unknown time    Review of Systems  Constitutional: Negative.    Gastrointestinal: Positive for abdominal pain. Negative for constipation, diarrhea, nausea and vomiting.  Genitourinary: Negative.    Physical Exam   Blood pressure 112/61, pulse 101, temperature 98.2 F (36.8 C), temperature source Oral, resp. rate 16, last menstrual period 10/16/2015, unknown if currently breastfeeding.  Physical Exam  Nursing note and vitals reviewed. Constitutional: She is oriented to person, place, and time. She appears well-developed and well-nourished. No distress.  HENT:  Head: Normocephalic and atraumatic.  Eyes: Conjunctivae are normal. Right eye exhibits no discharge. Left eye exhibits no discharge. No scleral icterus.  Neck: Normal range of motion.  Cardiovascular: Normal rate, regular rhythm and normal heart sounds.   No murmur heard. Respiratory: Effort normal and breath sounds normal. No respiratory distress. She has no wheezes.  GI: Soft. There is no tenderness.  Neurological: She is alert and oriented to person, place, and time.  Skin: Skin is warm and dry. She is not diaphoretic.  Psychiatric: She has a normal mood and affect. Her behavior is normal. Judgment and thought content normal.   Dilation: Closed (ext fingertip) Effacement (%): Thick Cervical Position: Posterior Exam by:: Judeth HornErin Aury Scollard NP  Fetal Tracing: Baby A Baseline: 140 Variability: moderate Accelerations: 10x10 Decelerations: small variable x 2  Baby B Baseline: 150 Variability: moderate  Accelerations: none Decelerations: none  Toco: UI, 2-84mins, 10-60 sec; resolved s/p IV LR bolus  MAU Course  Procedures Results for orders placed or performed during the hospital encounter of 03/30/16 (from the past 24 hour(s))  Urinalysis, Routine w reflex microscopic (not at [facility])     Status: Abnormal   Collection Time: 03/30/16 10:00 AM  Result Value Ref Range   Color, Urine YELLOW YELLOW   APPearance HAZY (A) CLEAR   Specific Gravity, Urine 1.025 1.005 - 1.030   pH 6.0 5.0 -  8.0   Glucose, UA NEGATIVE NEGATIVE mg/dL   Hgb urine dipstick NEGATIVE NEGATIVE   Bilirubin Urine NEGATIVE NEGATIVE   Ketones, ur NEGATIVE NEGATIVE mg/dL   Protein, ur NEGATIVE NEGATIVE mg/dL   Nitrite NEGATIVE NEGATIVE   Leukocytes, UA NEGATIVE NEGATIVE  Fetal fibronectin     Status: None   Collection Time: 03/30/16 11:20 AM  Result Value Ref Range   Fetal Fibronectin NEGATIVE NEGATIVE    MDM Fetal tracing appropriate for gestation Cervix closed & FFN negative IV LR bolus -- ctx resolved Ultrasound - cervical length 3.2 cm Pt declined repeat SVE  Assessment and Plan  A: 1. Pain of round ligament affecting pregnancy, antepartum   2. Preterm contractions, second trimester   3. Abdominal pain affecting pregnancy    P: Discharge home Recommend maternity support belt Preterm labor precautions Discussed reasons to return to MAU Keep f/u with OB  Judeth Horn 03/30/2016, 10:57 AM

## 2016-04-10 ENCOUNTER — Encounter: Payer: Self-pay | Admitting: Advanced Practice Midwife

## 2016-04-10 ENCOUNTER — Encounter: Payer: Self-pay | Admitting: Obstetrics & Gynecology

## 2016-04-16 ENCOUNTER — Telehealth: Payer: Self-pay | Admitting: Obstetrics and Gynecology

## 2016-04-16 NOTE — Telephone Encounter (Signed)
Patient missed her appointment because she got her days mixed up.

## 2016-04-19 ENCOUNTER — Encounter (HOSPITAL_COMMUNITY): Payer: Self-pay

## 2016-04-19 ENCOUNTER — Ambulatory Visit (HOSPITAL_COMMUNITY)
Admission: RE | Admit: 2016-04-19 | Discharge: 2016-04-19 | Disposition: A | Discharge status: Home / Self Care | Payer: Medicaid Other | Source: Ambulatory Visit | Attending: Certified Nurse Midwife | Admitting: Certified Nurse Midwife

## 2016-04-19 DIAGNOSIS — O30049 Twin pregnancy, dichorionic/diamniotic, unspecified trimester: Secondary | ICD-10-CM

## 2016-04-19 DIAGNOSIS — O30042 Twin pregnancy, dichorionic/diamniotic, second trimester: Secondary | ICD-10-CM | POA: Diagnosis present

## 2016-04-19 DIAGNOSIS — O09292 Supervision of pregnancy with other poor reproductive or obstetric history, second trimester: Secondary | ICD-10-CM

## 2016-04-19 DIAGNOSIS — Z3A26 26 weeks gestation of pregnancy: Secondary | ICD-10-CM | POA: Insufficient documentation

## 2016-04-19 DIAGNOSIS — O09212 Supervision of pregnancy with history of pre-term labor, second trimester: Secondary | ICD-10-CM

## 2016-04-19 DIAGNOSIS — O34219 Maternal care for unspecified type scar from previous cesarean delivery: Secondary | ICD-10-CM | POA: Insufficient documentation

## 2016-04-19 DIAGNOSIS — O09892 Supervision of other high risk pregnancies, second trimester: Secondary | ICD-10-CM

## 2016-04-19 DIAGNOSIS — Z98891 History of uterine scar from previous surgery: Secondary | ICD-10-CM

## 2016-05-01 ENCOUNTER — Encounter: Payer: Self-pay | Admitting: Family

## 2016-05-09 ENCOUNTER — Ambulatory Visit (INDEPENDENT_AMBULATORY_CARE_PROVIDER_SITE_OTHER): Payer: Medicaid Other | Admitting: Certified Nurse Midwife

## 2016-05-09 VITALS — BP 120/67 | HR 92 | Wt 214.0 lb

## 2016-05-09 DIAGNOSIS — O0993 Supervision of high risk pregnancy, unspecified, third trimester: Secondary | ICD-10-CM

## 2016-05-09 DIAGNOSIS — O09892 Supervision of other high risk pregnancies, second trimester: Secondary | ICD-10-CM

## 2016-05-09 DIAGNOSIS — O30049 Twin pregnancy, dichorionic/diamniotic, unspecified trimester: Secondary | ICD-10-CM

## 2016-05-09 DIAGNOSIS — O30043 Twin pregnancy, dichorionic/diamniotic, third trimester: Secondary | ICD-10-CM

## 2016-05-09 DIAGNOSIS — Z23 Encounter for immunization: Secondary | ICD-10-CM | POA: Diagnosis not present

## 2016-05-09 DIAGNOSIS — O09213 Supervision of pregnancy with history of pre-term labor, third trimester: Secondary | ICD-10-CM

## 2016-05-09 DIAGNOSIS — O34219 Maternal care for unspecified type scar from previous cesarean delivery: Secondary | ICD-10-CM

## 2016-05-09 LAB — POCT URINALYSIS DIP (DEVICE)
BILIRUBIN URINE: NEGATIVE
Glucose, UA: NEGATIVE mg/dL
HGB URINE DIPSTICK: NEGATIVE
Ketones, ur: NEGATIVE mg/dL
Nitrite: NEGATIVE
PH: 6.5 (ref 5.0–8.0)
Protein, ur: NEGATIVE mg/dL
SPECIFIC GRAVITY, URINE: 1.02 (ref 1.005–1.030)
UROBILINOGEN UA: 1 mg/dL (ref 0.0–1.0)

## 2016-05-09 LAB — CBC
HCT: 30.6 % — ABNORMAL LOW (ref 35.0–45.0)
HEMOGLOBIN: 10.3 g/dL — AB (ref 11.7–15.5)
MCH: 30.1 pg (ref 27.0–33.0)
MCHC: 33.7 g/dL (ref 32.0–36.0)
MCV: 89.5 fL (ref 80.0–100.0)
MPV: 9.8 fL (ref 7.5–12.5)
Platelets: 252 10*3/uL (ref 140–400)
RBC: 3.42 MIL/uL — AB (ref 3.80–5.10)
RDW: 12.9 % (ref 11.0–15.0)
WBC: 5.6 10*3/uL (ref 3.8–10.8)

## 2016-05-09 LAB — FETAL FIBRONECTIN: FETAL FIBRONECTIN: NEGATIVE

## 2016-05-09 LAB — HIV ANTIBODY (ROUTINE TESTING W REFLEX): HIV 1&2 Ab, 4th Generation: NONREACTIVE

## 2016-05-09 LAB — GLUCOSE TOLERANCE, 1 HOUR (50G) W/O FASTING: Glucose, 1 Hr, gestational: 84 mg/dL (ref <140)

## 2016-05-09 MED ORDER — TETANUS-DIPHTH-ACELL PERTUSSIS 5-2.5-18.5 LF-MCG/0.5 IM SUSP
0.5000 mL | Freq: Once | INTRAMUSCULAR | Status: AC
  Administered 2016-05-09: 0.5 mL via INTRAMUSCULAR

## 2016-05-09 NOTE — Progress Notes (Signed)
Patient reports contractions 3-4 times an hour for a while now

## 2016-05-09 NOTE — Progress Notes (Signed)
routine

## 2016-05-10 LAB — RPR

## 2016-05-10 NOTE — Progress Notes (Signed)
Subjective:  Alma FriendlyKierra D Poplaski is a 25 y.o. [phone] at 2061w4d being seen today for ongoing prenatal care.  She is currently monitored for the following issues for this high-risk pregnancy and has Supervision of other high risk pregnancy, antepartum; Dichorionic diamniotic twin pregnancy, antepartum; Hx successful VBAC (vaginal birth after cesarean), currently pregnant; Hx of cesarean section; Hx of fetal anomaly in prior pregnancy, currently pregnant; and Hx of preterm delivery, currently pregnant on her problem list.  Patient reports intermittent ctx x2 days. At times ctx are up to 4 in an hour. She also reports increased pelvic pressure..  Contractions: Irregular. Vag. Bleeding: None.  Movement: Present. Denies leaking of fluid.   The following portions of the patient's history were reviewed and updated as appropriate: allergies, current medications, past family history, past medical history, past social history, past surgical history and problem list. Problem list updated.  Objective:   Vitals:   05/09/16 0949  BP: 120/67  Pulse: 92  Weight: 214 lb (97.1 kg)    Fetal Status: Fetal Heart Rate (bpm): 134/140 Fundal Height: 35 cm Movement: Present     General:  Alert, oriented and cooperative. Patient is in no acute distress.  Skin: Skin is warm and dry. No rash noted.   Cardiovascular: Normal heart rate noted  Respiratory: Normal respiratory effort, no problems with respiration noted  Abdomen: Soft, gravid, appropriate for gestational age. Pain/Pressure: Present     Pelvic: Vag. Bleeding: None     Cervical exam performed Dilation: Closed Effacement (%): 0    Extremities: Normal range of motion.  Edema: Trace  Mental Status: Normal mood and affect. Normal behavior. Normal judgment and thought content.   Urinalysis: Urine Protein: Negative Urine Glucose: Negative  Assessment and Plan:  Pregnancy: [id number] at 2961w4d  1. High-risk pregnancy, third trimester - CBC - RPR - HIV antibody  (with reflex) - Glucose Tolerance, 1 HR (50g) w/o Fasting - Tdap (BOOSTRIX) injection 0.5 mL; Inject 0.5 mLs into the muscle once. - Fetal fibronectin  2. Supervision of other high risk pregnancy, antepartum, second trimester -no evidence of PTL -FFN collected  3. Dichorionic diamniotic twin pregnancy, antepartum -continue growth US q2-3 weeks  Preterm labor symptoms and general obstetric precautions including but not limited to vaginal bleeding, contractions, leaking of fluid and fetal movement were reviewed in detail with the patient. Please refer to After Visit Summary for other counseling recommendations.  Return in about 2 weeks (around 05/23/2016).   Donette LarryMelanie Kannen Moxey, CNM

## 2016-05-16 ENCOUNTER — Inpatient Hospital Stay (HOSPITAL_COMMUNITY)
Admission: AD | Admit: 2016-05-16 | Discharge: 2016-05-16 | Disposition: A | Discharge status: Home / Self Care | Payer: Medicaid Other | Source: Ambulatory Visit | Attending: Obstetrics & Gynecology | Admitting: Obstetrics & Gynecology

## 2016-05-16 ENCOUNTER — Encounter (HOSPITAL_COMMUNITY): Payer: Self-pay | Admitting: *Deleted

## 2016-05-16 DIAGNOSIS — R102 Pelvic and perineal pain: Secondary | ICD-10-CM | POA: Insufficient documentation

## 2016-05-16 DIAGNOSIS — N949 Unspecified condition associated with female genital organs and menstrual cycle: Secondary | ICD-10-CM

## 2016-05-16 DIAGNOSIS — M545 Low back pain: Secondary | ICD-10-CM | POA: Insufficient documentation

## 2016-05-16 DIAGNOSIS — Z3A30 30 weeks gestation of pregnancy: Secondary | ICD-10-CM | POA: Insufficient documentation

## 2016-05-16 DIAGNOSIS — Z0371 Encounter for suspected problem with amniotic cavity and membrane ruled out: Secondary | ICD-10-CM

## 2016-05-16 DIAGNOSIS — O30043 Twin pregnancy, dichorionic/diamniotic, third trimester: Secondary | ICD-10-CM | POA: Diagnosis present

## 2016-05-16 DIAGNOSIS — O4703 False labor before 37 completed weeks of gestation, third trimester: Secondary | ICD-10-CM | POA: Diagnosis not present

## 2016-05-16 DIAGNOSIS — O26893 Other specified pregnancy related conditions, third trimester: Secondary | ICD-10-CM | POA: Insufficient documentation

## 2016-05-16 LAB — URINE MICROSCOPIC-ADD ON

## 2016-05-16 LAB — WET PREP, GENITAL
SPERM: NONE SEEN
TRICH WET PREP: NONE SEEN
YEAST WET PREP: NONE SEEN

## 2016-05-16 LAB — URINALYSIS, ROUTINE W REFLEX MICROSCOPIC
GLUCOSE, UA: NEGATIVE mg/dL
HGB URINE DIPSTICK: NEGATIVE
Ketones, ur: 15 mg/dL — AB
Nitrite: NEGATIVE
PROTEIN: 30 mg/dL — AB
Specific Gravity, Urine: >1.03 — ABNORMAL HIGH (ref 1.005–1.030)
pH: 6 (ref 5.0–8.0)

## 2016-05-16 LAB — OB RESULTS CONSOLE GC/CHLAMYDIA: Gonorrhea: NEGATIVE

## 2016-05-16 MED ORDER — ONDANSETRON 4 MG PO TBDP: 4.0000 mg | ORAL_TABLET | Freq: Four times a day (QID) | ORAL | 2 refills | Status: DC | PRN

## 2016-05-16 NOTE — MAU Provider Note (Signed)
None     Chief Complaint:  Contractions   Alma FriendlyKierra D Tibbs is  25 y.o. [phone] at 4741w3d w/Di/Di twins presents complaining of Contractions . States that she has been having contractions "off and on" since last night. Feels "contractions" along the sides of her pelvis. Also C/O hips getting more sore and LBP. Denies vaginal itch or irritation.   States that she stood up yesterday to go void, and "some kind of water came out in my underware."  No leaking since then.  She had a negative Ffn 6 days ago. Obstetrical/Gynecological History: OB History    Gravida Para Term Preterm AB Living   7 3 2 1 3 3    SAB TAB Ectopic Multiple Live Births   1 2 0 0 3     Past Medical History: Past Medical History:  Diagnosis Date  . Anemia   . Infection    UTI  . Ovarian cyst   . Preterm labor     Past Surgical History: Past Surgical History:  Procedure Laterality Date  . CESAREAN SECTION    . INDUCED ABORTION      Family History: Family History  Problem Relation Age of Onset  . Asthma Brother   . Cancer Paternal Grandmother   . Asthma Daughter     Social History: Social History  Substance Use Topics  . Smoking status: Never Smoker  . Smokeless tobacco: Never Used  . Alcohol use No    Allergies: No Known Allergies  Meds:  Prescriptions Prior to Admission  Medication Sig Dispense Refill Last Dose  . acetaminophen (TYLENOL) 500 MG tablet Take 1,000 mg by mouth every 6 (six) hours as needed for mild pain, moderate pain or headache.    05/15/2016 at Unknown time  . Prenatal Vit-Fe Fumarate-FA (PRENATAL MULTIVITAMIN) TABS tablet Take 1 tablet by mouth daily.   05/15/2016 at Unknown time    Review of Systems   Constitutional: Negative for fever and chills Eyes: Negative for visual disturbances Respiratory: Negative for shortness of breath, dyspnea Cardiovascular: Negative for chest pain or palpitations  Gastrointestinal: Negative for vomiting, diarrhea and  constipation Genitourinary: Negative for dysuria and urgency Musculoskeletal: Positive for back pain, joint pain, myalgias.  Normal ROM  Neurological: Negative for dizziness and headaches    Physical Exam  Blood pressure 117/67, pulse 106, temperature 98 F (36.7 C), temperature source Oral, resp. rate 18, last menstrual period 10/16/2015, SpO2 98 %, unknown if currently breastfeeding. GENERAL: Well-developed, well-nourished female in no acute distress.  LUNGS: Clear to auscultation bilaterally.  HEART: Regular rate and rhythm. ABDOMEN: Soft, nontender, nondistended, gravid.  EXTREMITIES: Nontender, no edema, 2+ distal pulses. DTR's 2+ PELVIC:  SSE:  No pooling. Scant amount of normal appearing white discharge without odor.  Negative fern/valsalva CERVICAL EXAM: Dilatation 0cm   Effacement 0%   Station -2   Presentation: breech  FHT:  Baseline rate 145 X2 bpm   Variability moderate  Accelerations absent   Decelerations none Contractions: Every 0 mins   Labs: Results for orders placed or performed during the hospital encounter of 05/16/16 (from the past 24 hour(s))  Urinalysis, Routine w reflex microscopic (not at [facility])   Collection Time: 05/16/16  6:32 PM  Result Value Ref Range   Color, Urine YELLOW YELLOW   APPearance HAZY (A) CLEAR   Specific Gravity, Urine >1.030 (H) 1.005 - 1.030   pH 6.0 5.0 - 8.0   Glucose, UA NEGATIVE NEGATIVE mg/dL   Hgb urine dipstick NEGATIVE NEGATIVE  Bilirubin Urine SMALL (A) NEGATIVE   Ketones, ur 15 (A) NEGATIVE mg/dL   Protein, ur 30 (A) NEGATIVE mg/dL   Nitrite NEGATIVE NEGATIVE   Leukocytes, UA TRACE (A) NEGATIVE  Urine microscopic-add on   Collection Time: 05/16/16  6:32 PM  Result Value Ref Range   Squamous Epithelial / LPF 6-30 (A) NONE SEEN   WBC, UA 6-30 0 - 5 WBC/hpf   RBC / HPF 0-5 0 - 5 RBC/hpf   Bacteria, UA MANY (A) NONE SEEN   Urine-Other MUCOUS PRESENT   Wet prep, genital   Collection Time: 05/16/16  7:52 PM  Result Value  Ref Range   Yeast Wet Prep HPF POC NONE SEEN NONE SEEN   Trich, Wet Prep NONE SEEN NONE SEEN   Clue Cells Wet Prep HPF POC PRESENT (A) NONE SEEN   WBC, Wet Prep HPF POC MODERATE (A) NONE SEEN   Sperm NONE SEEN    Imaging Studies:  [place] [facility] Ob Follow Up  Result Date: 04/22/2016 OBSTETRICAL ULTRASOUND: This exam was performed within a [facility]. The OB US report was generated in the AS system, and faxed to the ordering physician.  This report is available in the [employer]. See the AS Obstetric US report via the Image Link.  [place] [facility] Ob Follow Up Addl Gest  Result Date: 04/22/2016 OBSTETRICAL ULTRASOUND: This exam was performed within a [facility]. The OB US report was generated in the AS system, and faxed to the ordering physician.  This report is available in the [employer]. See the AS Obstetric US report via the Image Link.   Assessment: EMMA BIRCHLER is  25 y.o. [phone] at [redacted]w[redacted]d presents with round ligament pain, LBP No evidence of ROM No evidence of PTL No evidence of vaginal infection (some clue cells on wet prep, but pt is asymptomatic).  Plan: DC home w/PTL precations  CRESENZO-DISHMAN,Ahmaya Ostermiller 9/28/20178:25 PM

## 2016-05-16 NOTE — Discharge Instructions (Signed)
°  Kinesiology taping for pregnancy:  Youtube has good vidoes of "how tos" for lower back, pelvic, hip pain; swelling of feet, etc    For your lower back pain you may:  Purchase a pregnancy belt from [employer], [employer], [employer], etc and wear it while you are up and about  Take warm baths  Use a heating pad to your lower back for no longer than 20 minutes at a time, and do not place near abdomen  Take tylenol as needed. Please follow directions on the bottle   Round Ligament Pain The round ligament is a cord of muscle and tissue that helps to support the uterus. It can become a source of pain during pregnancy if it becomes stretched or twisted as the baby grows. The pain usually begins in the second trimester of pregnancy, and it can come and go until the baby is delivered. It is not a serious problem, and it does not cause harm to the baby. Round ligament pain is usually a short, sharp, and pinching pain, but it can also be a dull, lingering, and aching pain. The pain is felt in the lower side of the abdomen or in the groin. It usually starts deep in the groin and moves up to the outside of the hip area. Pain can occur with:  A sudden change in position.  Rolling over in bed.  Coughing or sneezing.  Physical activity. HOME CARE INSTRUCTIONS Watch your condition for any changes. Take these steps to help with your pain:  When the pain starts, relax. Then try:  Sitting down.  Flexing your knees up to your abdomen.  Lying on your side with one pillow under your abdomen and another pillow between your legs.  Sitting in a warm bath for 15-20 minutes or until the pain goes away.  Take over-the-counter and prescription medicines only as told by your health care provider.  Move slowly when you sit and stand.  Avoid long walks if they cause pain.  Stop or lessen your physical activities if they cause pain. SEEK MEDICAL CARE IF:  Your pain does not go away with  treatment.  You feel pain in your back that you did not have before.  Your medicine is not helping. SEEK IMMEDIATE MEDICAL CARE IF:  You develop a fever or chills.  You develop uterine contractions.  You develop vaginal bleeding.  You develop nausea or vomiting.  You develop diarrhea.  You have pain when you urinate.   This information is not intended to replace advice given to you by your health care provider. Make sure you discuss any questions you have with your health care provider.   Document Released: 05/14/2008 Document Revised: 10/28/2011 Document Reviewed: 10/12/2014 Elsevier Interactive Patient Education [employer]2016 Elsevier Inc.

## 2016-05-16 NOTE — Progress Notes (Signed)
Dr. Genevie AnnSchenk notified of pt in MAU.  Notified that pt is a G7P3 at 5256w3d.  Notified that pt came in complaining of contractions but describes then as a sharp pain.  Provider also notified that pt is unsure if she is leaking.  Provider notified that pt is pregnant with twins.  Provider states he will come see the pt.

## 2016-05-16 NOTE — MAU Note (Signed)
Pt states she started having contractions yesterday around 0900.  Pt states they were on and off since then but they started to pick up around 1400 this afternoon.  Pt thinks she may be leaking fluid.

## 2016-05-17 ENCOUNTER — Ambulatory Visit (HOSPITAL_COMMUNITY)
Admission: RE | Admit: 2016-05-17 | Discharge: 2016-05-17 | Disposition: A | Discharge status: Home / Self Care | Payer: Medicaid Other | Source: Ambulatory Visit | Attending: Certified Nurse Midwife | Admitting: Certified Nurse Midwife

## 2016-05-17 ENCOUNTER — Other Ambulatory Visit (HOSPITAL_COMMUNITY): Payer: Self-pay | Admitting: Maternal and Fetal Medicine

## 2016-05-17 ENCOUNTER — Encounter (HOSPITAL_COMMUNITY): Payer: Self-pay

## 2016-05-17 DIAGNOSIS — O30049 Twin pregnancy, dichorionic/diamniotic, unspecified trimester: Secondary | ICD-10-CM

## 2016-05-17 DIAGNOSIS — Z3A30 30 weeks gestation of pregnancy: Secondary | ICD-10-CM | POA: Diagnosis not present

## 2016-05-17 DIAGNOSIS — O34219 Maternal care for unspecified type scar from previous cesarean delivery: Secondary | ICD-10-CM | POA: Insufficient documentation

## 2016-05-17 DIAGNOSIS — O09893 Supervision of other high risk pregnancies, third trimester: Secondary | ICD-10-CM

## 2016-05-17 DIAGNOSIS — O09213 Supervision of pregnancy with history of pre-term labor, third trimester: Secondary | ICD-10-CM | POA: Diagnosis not present

## 2016-05-17 DIAGNOSIS — O30043 Twin pregnancy, dichorionic/diamniotic, third trimester: Secondary | ICD-10-CM | POA: Insufficient documentation

## 2016-05-17 LAB — GC/CHLAMYDIA PROBE AMP (~~LOC~~) NOT AT ARMC
Chlamydia: NEGATIVE
Neisseria Gonorrhea: NEGATIVE

## 2016-05-17 NOTE — ED Notes (Signed)
Pt feeling weak and dizzy during ultrasound.  BP lying 99/46, sitting 127/65 pulse 106, standing 130/79, pulse 102.  Pt sipping on sprite.  MD notified.

## 2016-05-20 ENCOUNTER — Inpatient Hospital Stay (HOSPITAL_COMMUNITY)
Admission: AD | Admit: 2016-05-20 | Discharge: 2016-05-20 | Disposition: A | Discharge status: Home / Self Care | Payer: Medicaid Other | Source: Ambulatory Visit | Attending: Obstetrics and Gynecology | Admitting: Obstetrics and Gynecology

## 2016-05-20 ENCOUNTER — Encounter (HOSPITAL_COMMUNITY): Payer: Self-pay

## 2016-05-20 DIAGNOSIS — O30043 Twin pregnancy, dichorionic/diamniotic, third trimester: Secondary | ICD-10-CM | POA: Diagnosis not present

## 2016-05-20 DIAGNOSIS — R079 Chest pain, unspecified: Secondary | ICD-10-CM | POA: Insufficient documentation

## 2016-05-20 DIAGNOSIS — Z3A31 31 weeks gestation of pregnancy: Secondary | ICD-10-CM | POA: Insufficient documentation

## 2016-05-20 DIAGNOSIS — O30049 Twin pregnancy, dichorionic/diamniotic, unspecified trimester: Secondary | ICD-10-CM

## 2016-05-20 DIAGNOSIS — O09299 Supervision of pregnancy with other poor reproductive or obstetric history, unspecified trimester: Secondary | ICD-10-CM

## 2016-05-20 DIAGNOSIS — O26893 Other specified pregnancy related conditions, third trimester: Secondary | ICD-10-CM | POA: Insufficient documentation

## 2016-05-20 DIAGNOSIS — Z98891 History of uterine scar from previous surgery: Secondary | ICD-10-CM

## 2016-05-20 DIAGNOSIS — R0789 Other chest pain: Secondary | ICD-10-CM | POA: Diagnosis not present

## 2016-05-20 DIAGNOSIS — O09219 Supervision of pregnancy with history of pre-term labor, unspecified trimester: Secondary | ICD-10-CM

## 2016-05-20 DIAGNOSIS — O34219 Maternal care for unspecified type scar from previous cesarean delivery: Secondary | ICD-10-CM

## 2016-05-20 DIAGNOSIS — O9989 Other specified diseases and conditions complicating pregnancy, childbirth and the puerperium: Secondary | ICD-10-CM

## 2016-05-20 DIAGNOSIS — O09899 Supervision of other high risk pregnancies, unspecified trimester: Secondary | ICD-10-CM

## 2016-05-20 LAB — CBC
HCT: 28.1 % — ABNORMAL LOW (ref 36.0–46.0)
Hemoglobin: 9.9 g/dL — ABNORMAL LOW (ref 12.0–15.0)
MCH: 30.5 pg (ref 26.0–34.0)
MCHC: 35.2 g/dL (ref 30.0–36.0)
MCV: 86.5 fL (ref 78.0–100.0)
PLATELETS: 233 10*3/uL (ref 150–400)
RBC: 3.25 MIL/uL — AB (ref 3.87–5.11)
RDW: 12.9 % (ref 11.5–15.5)
WBC: 5.2 10*3/uL (ref 4.0–10.5)

## 2016-05-20 LAB — COMPREHENSIVE METABOLIC PANEL
ALK PHOS: 124 U/L (ref 38–126)
ALT: 12 U/L — AB (ref 14–54)
AST: 18 U/L (ref 15–41)
Albumin: 2.9 g/dL — ABNORMAL LOW (ref 3.5–5.0)
Anion gap: 7 (ref 5–15)
BILIRUBIN TOTAL: 0.3 mg/dL (ref 0.3–1.2)
BUN: 8 mg/dL (ref 6–20)
CALCIUM: 9.1 mg/dL (ref 8.9–10.3)
CO2: 20 mmol/L — AB (ref 22–32)
CREATININE: 0.65 mg/dL (ref 0.44–1.00)
Chloride: 107 mmol/L (ref 101–111)
GFR calc non Af Amer: >60 mL/min (ref >60)
GLUCOSE: 94 mg/dL (ref 65–99)
Potassium: 3.6 mmol/L (ref 3.5–5.1)
SODIUM: 134 mmol/L — AB (ref 135–145)
TOTAL PROTEIN: 6.8 g/dL (ref 6.5–8.1)

## 2016-05-20 LAB — URINE MICROSCOPIC-ADD ON

## 2016-05-20 LAB — URINALYSIS, ROUTINE W REFLEX MICROSCOPIC
BILIRUBIN URINE: NEGATIVE
Glucose, UA: NEGATIVE mg/dL
HGB URINE DIPSTICK: NEGATIVE
Ketones, ur: NEGATIVE mg/dL
Nitrite: NEGATIVE
PH: 6.5 (ref 5.0–8.0)
Protein, ur: NEGATIVE mg/dL
SPECIFIC GRAVITY, URINE: 1.01 (ref 1.005–1.030)

## 2016-05-20 LAB — PROTEIN / CREATININE RATIO, URINE
Creatinine, Urine: 37 mg/dL
Total Protein, Urine: <6 mg/dL

## 2016-05-20 MED ORDER — RANITIDINE HCL 150 MG PO TABS: 150.0000 mg | ORAL_TABLET | Freq: Two times a day (BID) | ORAL | 1 refills | Status: DC

## 2016-05-20 NOTE — MAU Provider Note (Signed)
History     CSN: [id number]  Arrival date and time: 05/20/16 2030   First Provider Initiated Contact with Patient 05/20/16 2108      Chief Complaint  Patient presents with  . diaphoresis  . Shortness of Breath  . Chest Pain  . Dizziness   HPI  Pt is a 25 y/o G7P3 at 1131 wks pregnant with di/di twins presenting for acute onset of sweating chest pain and SOB this PM. She had just eaten fish and french fries and was laying back in a chair when the pain begain. At this time pt has no symptoms. She called 911 and when fire arrived her BP was elevated > 160/90. However when EMS took her Bp it was normal and she has had no elevated blood pressures in MAU. She reports she has no history of asthma or heart/lung problems. She has never experienced anything like this before.    OB History    Gravida Para Term Preterm AB Living   7 3 2 1 3 3    SAB TAB Ectopic Multiple Live Births   1 2 0 0 3      Past Medical History:  Diagnosis Date  . Anemia   . Infection    UTI  . Ovarian cyst   . Preterm labor     Past Surgical History:  Procedure Laterality Date  . CESAREAN SECTION    . INDUCED ABORTION      Family History  Problem Relation Age of Onset  . Asthma Brother   . Cancer Paternal Grandmother   . Asthma Daughter     Social History  Substance Use Topics  . Smoking status: Never Smoker  . Smokeless tobacco: Never Used  . Alcohol use No    Allergies: No Known Allergies  Prescriptions Prior to Admission  Medication Sig Dispense Refill Last Dose  . acetaminophen (TYLENOL) 500 MG tablet Take 1,000 mg by mouth every 6 (six) hours as needed for mild pain, moderate pain or headache.    Past Month at Unknown time  . Prenatal Vit-Fe Fumarate-FA (PRENATAL MULTIVITAMIN) TABS tablet Take 1 tablet by mouth daily.   05/19/2016 at Unknown time  . ondansetron (ZOFRAN ODT) 4 MG disintegrating tablet Take 1 tablet (4 mg total) by mouth every 6 (six) hours as needed for nausea. (Patient  not taking: Reported on 05/20/2016) 30 tablet 2 Not Taking at Unknown time    Review of Systems  Constitutional: Negative for chills and fever.  HENT: Negative for congestion.   Eyes: Negative for blurred vision and double vision.  Respiratory: Positive for shortness of breath. Negative for cough, hemoptysis and sputum production.   Cardiovascular: Positive for chest pain. Negative for palpitations.  Gastrointestinal: Negative for abdominal pain, heartburn, nausea and vomiting.  Genitourinary: Negative for dysuria, frequency and urgency.  Musculoskeletal: Negative for back pain, myalgias and neck pain.  Skin: Negative for itching and rash.  Neurological: Negative for dizziness, tingling and headaches.  Endo/Heme/Allergies: Negative for environmental allergies. Does not bruise/bleed easily.   Physical Exam   Blood pressure 120/69, pulse 92, temperature 98.2 F (36.8 C), temperature source Oral, resp. rate 18, height 5\' 3"  (1.6 m), weight 216 lb (98 kg), last menstrual period 10/16/2015, SpO2 100 %, unknown if currently breastfeeding.  Physical Exam  Vitals reviewed. Constitutional: She is oriented to person, place, and time. She appears well-developed and well-nourished.  HENT:  Head: Normocephalic and atraumatic.  Cardiovascular: Normal rate, regular rhythm and normal heart sounds.  Respiratory: Effort normal. No respiratory distress. She has no wheezes. She has no rales. She exhibits no tenderness.  GI: Soft. Bowel sounds are normal. She exhibits no distension. There is no tenderness. There is no rebound and no guarding.  Musculoskeletal: Normal range of motion. She exhibits edema.  Neurological: She is alert and oriented to person, place, and time. No cranial nerve deficit.  Skin: Skin is warm and dry.  Psychiatric: She has a normal mood and affect. Her behavior is normal.    MAU Course  Procedures  MDM In the MAU patient underwent EKG testing and full physical exam. Her  vitals were monitored and within normal limits. Her pulse oximetry was normal.  Given the one-time elevated blood pressure preeclampsia labs were checked and all were within normal limits.  Informed patient that this could be multiple things including muscle sprain or worsening reflux. Advised she can take over-the-counter Zantac.  Assessment and Plan  #1: Chest pain likely secondary to reflux. Resolved at this time. Continue to monitor. No abnormal vital signs aside from some minimal tachycardia on admission blood pressures have remained within normal limits pulse oximetry 100%. Patient okay to discharge home at this time. We will reassure patient that no cardiac or pulmonary etiology is indicated or exhibited at this time.  Ernestina Penna 05/20/2016, 10:19 PM

## 2016-05-20 NOTE — MAU Note (Signed)
Pt c/o feeling sweaty on her hands and face tonight around 7pm. Pt states she felt like something was sitting on her chest. Pt states she also felt short of breathe at that time. Pt felt lightheaded and like she was floating. Pt denies bleeding and leaking of fluid. Pt c/o cramping but she states that is not a new thing. Pt states babies are moving normally.

## 2016-05-20 NOTE — Discharge Instructions (Signed)
Esophagitis °Esophagitis is inflammation of the esophagus. The esophagus is the tube that carries food and liquids from your mouth to your stomach. Esophagitis can cause soreness or pain in the esophagus. This condition can make it difficult and painful to swallow.  °CAUSES °Most causes of esophagitis are not serious. Common causes of this condition include: °· Gastroesophageal reflux disease (GERD). This is when stomach contents move back up into the esophagus (reflux). °· Repeated vomiting. °· An allergic-type reaction, especially caused by food allergies (eosinophilic esophagitis). °· Injury to the esophagus by swallowing large pills with or without water, or swallowing certain types of medicines. °· Swallowing (ingesting) harmful chemicals, such as household cleaning products. °· Heavy alcohol use. °· An infection of the esophagus. This most often occurs in people who have a weakened immune system. °· Radiation or chemotherapy treatment for cancer. °· Certain diseases such as sarcoidosis, Crohn disease, and scleroderma. °SYMPTOMS °Symptoms of this condition include: °· Difficult or painful swallowing. °· Pain with swallowing acidic liquids, such as citrus juices. °· Pain with burping. °· Chest pain. °· Difficulty breathing. °· Nausea. °· Vomiting. °· Pain in the abdomen. °· Weight loss. °· Ulcers in the mouth. °· Patches of white material in the mouth (candidiasis). °· Fever. °· Coughing up blood or vomiting blood. °· Stool that is black, tarry, or bright red. °DIAGNOSIS °Your health care provider will take a medical history and perform a physical exam. You may also have other tests, including: °· An endoscopy to examine your stomach and esophagus with a small camera. °· A test that measures the acidity level in your esophagus. °· A test that measures how much pressure is on your esophagus. °· A barium swallow or modified barium swallow to show the shape, size, and functioning of your esophagus. °· Allergy  tests. °TREATMENT °Treatment for this condition depends on the cause of your esophagitis. In some cases, steroids or other medicines may be given to help relieve your symptoms or to treat the underlying cause of your condition. You may have to make some lifestyle changes, such as: °· Avoiding alcohol. °· Quitting smoking. °· Changing your diet. °· Exercising. °· Changing your sleep habits and your sleep environment. °HOME CARE INSTRUCTIONS °Take these actions to decrease your discomfort and to help avoid complications. °Diet °· Follow a diet as recommended by your health care provider. This may involve avoiding foods and drinks such as: °¨ Coffee and tea (with or without caffeine). °¨ Drinks that contain alcohol. °¨ Energy drinks and sports drinks. °¨ Carbonated drinks or sodas. °¨ Chocolate and cocoa. °¨ Peppermint and mint flavorings. °¨ Garlic and onions. °¨ Horseradish. °¨ Spicy and acidic foods, including peppers, chili powder, curry powder, vinegar, hot sauces, and barbecue sauce. °¨ Citrus fruit juices and citrus fruits, such as oranges, lemons, and limes. °¨ Tomato-based foods, such as red sauce, chili, salsa, and pizza with red sauce. °¨ Fried and fatty foods, such as donuts, french fries, potato chips, and high-fat dressings. °¨ High-fat meats, such as hot dogs and fatty cuts of red and white meats, such as rib eye steak, sausage, ham, and bacon. °¨ High-fat dairy items, such as whole milk, butter, and cream cheese. °· Eat small, frequent meals instead of large meals. °· Avoid drinking large amounts of liquid with your meals. °· Avoid eating meals during the 2-3 hours before bedtime. °· Avoid lying down right after you eat. °· Do not exercise right after you eat. °· Avoid foods and drinks that seem to   make your symptoms worse. °General Instructions °· Pay attention to any changes in your symptoms. °· Take over-the-counter and prescription medicines only as told by your health care provider. Do not take  aspirin, ibuprofen, or other NSAIDs unless your health care provider told you to do so. °· If you have trouble taking pills, use a pill splitter to decrease the size of the pill. This will decrease the chance of the pill getting stuck or injuring your esophagus on the way down. Also, drink water after you take a pill. °· Do not use any tobacco products, including cigarettes, chewing tobacco, and e-cigarettes. If you need help quitting, ask your health care provider. °· Wear loose-fitting clothing. Do not wear anything tight around your waist that causes pressure on your abdomen. °· Raise (elevate) the head of your bed about 6 inches (15 cm). °· Try to reduce your stress, such as with yoga or meditation. If you need help reducing stress, ask your health care provider. °· If you are overweight, reduce your weight to an amount that is healthy for you. Ask your health care provider for guidance about a safe weight loss goal. °· Keep all follow-up visits as told by your health care provider. This is important. °SEEK MEDICAL CARE IF: °· You have new symptoms. °· You have unexplained weight loss. °· You have difficulty swallowing, or it hurts to swallow. °· You have wheezing or a persistent cough. °· Your symptoms do not improve with treatment. °· You have frequent heartburn for more than two weeks. °SEEK IMMEDIATE MEDICAL CARE IF: °· You have severe pain in your arms, neck, jaw, teeth, or back. °· You feel sweaty, dizzy, or light-headed. °· You have chest pain or shortness of breath. °· You vomit and your vomit looks like blood or coffee grounds. °· Your stool is bloody or black. °· You have a fever. °· You cannot swallow, drink, or eat. °  °This information is not intended to replace advice given to you by your health care provider. Make sure you discuss any questions you have with your health care provider. °  °Document Released: 09/12/2004 Document Revised: 04/26/2015 Document Reviewed: 11/30/2014 °Elsevier Interactive  Patient Education ©2016 Elsevier Inc. ° °

## 2016-05-21 ENCOUNTER — Other Ambulatory Visit (HOSPITAL_COMMUNITY): Payer: Self-pay | Admitting: *Deleted

## 2016-05-21 DIAGNOSIS — O30043 Twin pregnancy, dichorionic/diamniotic, third trimester: Secondary | ICD-10-CM

## 2016-05-28 ENCOUNTER — Telehealth: Payer: Self-pay | Admitting: *Deleted

## 2016-05-28 ENCOUNTER — Ambulatory Visit (INDEPENDENT_AMBULATORY_CARE_PROVIDER_SITE_OTHER): Payer: Medicaid Other | Admitting: Advanced Practice Midwife

## 2016-05-28 VITALS — BP 111/68 | HR 84 | Wt 216.0 lb

## 2016-05-28 DIAGNOSIS — N949 Unspecified condition associated with female genital organs and menstrual cycle: Secondary | ICD-10-CM

## 2016-05-28 DIAGNOSIS — J069 Acute upper respiratory infection, unspecified: Secondary | ICD-10-CM

## 2016-05-28 DIAGNOSIS — O34219 Maternal care for unspecified type scar from previous cesarean delivery: Secondary | ICD-10-CM

## 2016-05-28 DIAGNOSIS — R102 Pelvic and perineal pain: Secondary | ICD-10-CM

## 2016-05-28 DIAGNOSIS — O30049 Twin pregnancy, dichorionic/diamniotic, unspecified trimester: Secondary | ICD-10-CM

## 2016-05-28 DIAGNOSIS — O0993 Supervision of high risk pregnancy, unspecified, third trimester: Secondary | ICD-10-CM

## 2016-05-28 DIAGNOSIS — O30043 Twin pregnancy, dichorionic/diamniotic, third trimester: Secondary | ICD-10-CM

## 2016-05-28 DIAGNOSIS — O26893 Other specified pregnancy related conditions, third trimester: Secondary | ICD-10-CM

## 2016-05-28 LAB — FETAL FIBRONECTIN: Fetal Fibronectin: NEGATIVE

## 2016-05-28 NOTE — Progress Notes (Signed)
[facility] stating that FFN from today is negative.

## 2016-05-28 NOTE — Telephone Encounter (Signed)
Called patient to inform her of negative FFN. Pts voicemail is not set up. If she calls back we can inform her of negative results.

## 2016-05-28 NOTE — Progress Notes (Signed)
   PRENATAL VISIT NOTE  Subjective:  Maria Olson is a 25 y.o. [phone] at 7581w1d being seen today for ongoing prenatal care.  She is currently monitored for the following issues for this high-risk pregnancy and has Supervision of other high risk pregnancy, antepartum; Dichorionic diamniotic twin pregnancy, antepartum; Hx successful VBAC (vaginal birth after cesarean), currently pregnant; Hx of cesarean section; Hx of fetal anomaly in prior pregnancy, currently pregnant; and Hx of preterm delivery, currently pregnant on her problem list.  Patient reports cramping and vaginal/rectal pressure as well as cough, stuffy nose, chest congestion >1 week..  Contractions: Irritability. Vag. Bleeding: None.  Movement: Present. Denies leaking of fluid.   The following portions of the patient's history were reviewed and updated as appropriate: allergies, current medications, past family history, past medical history, past social history, past surgical history and problem list. Problem list updated.  Objective:   Vitals:   05/28/16 1258  BP: 111/68  Pulse: 84  Weight: 216 lb (98 kg)    Fetal Status: Fetal Heart Rate (bpm): 136/146   Movement: Present     General:  Alert, oriented and cooperative. Patient is in no acute distress.  Skin: Skin is warm and dry. No rash noted.   Cardiovascular: Normal heart rate noted  Respiratory: Normal respiratory effort, no problems with respiration noted  Abdomen: Soft, gravid, appropriate for gestational age. Pain/Pressure: Present     Pelvic:  Cervical exam performed Dilation: 1 Effacement (%): 0 Station: Ballotable  Extremities: Normal range of motion.  Edema: Trace  Mental Status: Normal mood and affect. Normal behavior. Normal judgment and thought content.   Urinalysis:      Assessment and Plan:  Pregnancy: [id number] at 3681w1d  1. High-risk pregnancy, third trimester   2. Dichorionic diamniotic twin pregnancy, antepartum   3. Hx successful VBAC  (vaginal birth after cesarean), currently pregnant   4. Pelvic pressure in pregnancy, antepartum, third trimester --U/A - Fetal fibronectin  5. Upper respiratory tract infection, unspecified type --Likely viral.  Recommend Sudafed and Mucinex for URI, increase PO fluids.  Call for fever or shortness of breath.  Preterm labor symptoms and general obstetric precautions including but not limited to vaginal bleeding, contractions, leaking of fluid and fetal movement were reviewed in detail with the patient. Please refer to After Visit Summary for other counseling recommendations.  Return in about 2 weeks (around 06/11/2016).  Hurshel PartyLisa A Leftwich-Kirby, CNM

## 2016-05-28 NOTE — Patient Instructions (Signed)
Upper Respiratory Infection, Adult Most upper respiratory infections (URIs) are a viral infection of the air passages leading to the lungs. A URI affects the nose, throat, and upper air passages. The most common type of URI is nasopharyngitis and is typically referred to as "the common cold." URIs run their course and usually go away on their own. Most of the time, a URI does not require medical attention, but sometimes a bacterial infection in the upper airways can follow a viral infection. This is called a secondary infection. Sinus and middle ear infections are common types of secondary upper respiratory infections. Bacterial pneumonia can also complicate a URI. A URI can worsen asthma and chronic obstructive pulmonary disease (COPD). Sometimes, these complications can require emergency medical care and may be life threatening.  CAUSES Almost all URIs are caused by viruses. A virus is a type of germ and can spread from one person to another.  RISKS FACTORS You may be at risk for a URI if:   You smoke.   You have chronic heart or lung disease.  You have a weakened defense (immune) system.   You are very young or very old.   You have nasal allergies or asthma.  You work in crowded or poorly ventilated areas.  You work in health care facilities or schools. SIGNS AND SYMPTOMS  Symptoms typically develop 2-3 days after you come in contact with a cold virus. Most viral URIs last 7-10 days. However, viral URIs from the influenza virus (flu virus) can last 14-18 days and are typically more severe. Symptoms may include:   Runny or stuffy (congested) nose.   Sneezing.   Cough.   Sore throat.   Headache.   Fatigue.   Fever.   Loss of appetite.   Pain in your forehead, behind your eyes, and over your cheekbones (sinus pain).  Muscle aches.  DIAGNOSIS  Your health care provider may diagnose a URI by:  Physical exam.  Tests to check that your symptoms are not due to  another condition such as:  Strep throat.  Sinusitis.  Pneumonia.  Asthma. TREATMENT  A URI goes away on its own with time. It cannot be cured with medicines, but medicines may be prescribed or recommended to relieve symptoms. Medicines may help:  Reduce your fever.  Reduce your cough.  Relieve nasal congestion. HOME CARE INSTRUCTIONS   Take medicines only as directed by your health care provider.   Gargle warm saltwater or take cough drops to comfort your throat as directed by your health care provider.  Use a warm mist humidifier or inhale steam from a shower to increase air moisture. This may make it easier to breathe.  Drink enough fluid to keep your urine clear or pale yellow.   Eat soups and other clear broths and maintain good nutrition.   Rest as needed.   Return to work when your temperature has returned to normal or as your health care provider advises. You may need to stay home longer to avoid infecting others. You can also use a face mask and careful hand washing to prevent spread of the virus.  Increase the usage of your inhaler if you have asthma.   Do not use any tobacco products, including cigarettes, chewing tobacco, or electronic cigarettes. If you need help quitting, ask your health care provider. PREVENTION  The best way to protect yourself from getting a cold is to practice good hygiene.   Avoid oral or hand contact with people with cold   symptoms.   Wash your hands often if contact occurs.  There is no clear evidence that vitamin C, vitamin E, echinacea, or exercise reduces the chance of developing a cold. However, it is always recommended to get plenty of rest, exercise, and practice good nutrition.  SEEK MEDICAL CARE IF:   You are getting worse rather than better.   Your symptoms are not controlled by medicine.   You have chills.  You have worsening shortness of breath.  You have brown or red mucus.  You have yellow or brown nasal  discharge.  You have pain in your face, especially when you bend forward.  You have a fever.  You have swollen neck glands.  You have pain while swallowing.  You have white areas in the back of your throat. SEEK IMMEDIATE MEDICAL CARE IF:   You have severe or persistent:  Headache.  Ear pain.  Sinus pain.  Chest pain.  You have chronic lung disease and any of the following:  Wheezing.  Prolonged cough.  Coughing up blood.  A change in your usual mucus.  You have a stiff neck.  You have changes in your:  Vision.  Hearing.  Thinking.  Mood. MAKE SURE YOU:   Understand these instructions.  Will watch your condition.  Will get help right away if you are not doing well or get worse.   This information is not intended to replace advice given to you by your health care provider. Make sure you discuss any questions you have with your health care provider.   Document Released: 01/29/2001 Document Revised: 12/20/2014 Document Reviewed: 11/10/2013 Elsevier Interactive Patient Education 2016 [employer].  Preterm Labor Information Preterm labor is when labor starts at less than 37 weeks of pregnancy. The normal length of a pregnancy is 39 to 41 weeks. CAUSES Often, there is no identifiable underlying cause as to why a woman goes into preterm labor. One of the most common known causes of preterm labor is infection. Infections of the uterus, cervix, vagina, amniotic sac, bladder, kidney, or even the lungs (pneumonia) can cause labor to start. Other suspected causes of preterm labor include:   Urogenital infections, such as yeast infections and bacterial vaginosis.   Uterine abnormalities (uterine shape, uterine septum, fibroids, or bleeding from the placenta).   A cervix that has been operated on (it may fail to stay closed).   Malformations in the fetus.   Multiple gestations (twins, triplets, and so on).   Breakage of the amniotic sac.  RISK  FACTORS  Having a previous history of preterm labor.   Having premature rupture of membranes (PROM).   Having a placenta that covers the opening of the cervix (placenta previa).   Having a placenta that separates from the uterus (placental abruption).   Having a cervix that is too weak to hold the fetus in the uterus (incompetent cervix).   Having too much fluid in the amniotic sac (polyhydramnios).   Taking illegal drugs or smoking while pregnant.   Not gaining enough weight while pregnant.   Being younger than 79 and older than 23 years old.   Having a low socioeconomic status.   Being African American. SYMPTOMS Signs and symptoms of preterm labor include:   Menstrual-like cramps, abdominal pain, or back pain.  Uterine contractions that are regular, as frequent as six in an hour, regardless of their intensity (may be mild or painful).  Contractions that start on the top of the uterus and spread down to the  lower abdomen and back.   A sense of increased pelvic pressure.   A watery or bloody mucus discharge that comes from the vagina.  TREATMENT Depending on the length of the pregnancy and other circumstances, your health care provider may suggest bed rest. If necessary, there are medicines that can be given to stop contractions and to mature the fetal lungs. If labor happens before 34 weeks of pregnancy, a prolonged hospital stay may be recommended. Treatment depends on the condition of both you and the fetus.  WHAT SHOULD YOU DO IF YOU THINK YOU ARE IN PRETERM LABOR? Call your health care provider right away. You will need to go to the hospital to get checked immediately. HOW CAN YOU PREVENT PRETERM LABOR IN FUTURE PREGNANCIES? You should:   Stop smoking if you smoke.  Maintain healthy weight gain and avoid chemicals and drugs that are not necessary.  Be watchful for any type of infection.  Inform your health care provider if you have a known history of  preterm labor.   This information is not intended to replace advice given to you by your health care provider. Make sure you discuss any questions you have with your health care provider.   Document Released: 10/26/2003 Document Revised: 04/07/2013 Document Reviewed: 09/07/2012 Elsevier Interactive Patient Education [employer]2016 Elsevier Inc.

## 2016-05-30 ENCOUNTER — Encounter (HOSPITAL_COMMUNITY): Payer: Self-pay

## 2016-05-30 ENCOUNTER — Inpatient Hospital Stay (HOSPITAL_COMMUNITY)
Admission: AD | Admit: 2016-05-30 | Discharge: 2016-06-02 | DRG: 778 | Disposition: A | Discharge status: Home / Self Care | Payer: Medicaid Other | Source: Ambulatory Visit | Attending: Obstetrics and Gynecology | Admitting: Obstetrics and Gynecology

## 2016-05-30 DIAGNOSIS — O34211 Maternal care for low transverse scar from previous cesarean delivery: Secondary | ICD-10-CM | POA: Diagnosis present

## 2016-05-30 DIAGNOSIS — O34219 Maternal care for unspecified type scar from previous cesarean delivery: Secondary | ICD-10-CM

## 2016-05-30 DIAGNOSIS — Z3A32 32 weeks gestation of pregnancy: Secondary | ICD-10-CM

## 2016-05-30 DIAGNOSIS — O321XX2 Maternal care for breech presentation, fetus 2: Secondary | ICD-10-CM | POA: Diagnosis present

## 2016-05-30 DIAGNOSIS — O321XX1 Maternal care for breech presentation, fetus 1: Secondary | ICD-10-CM | POA: Diagnosis not present

## 2016-05-30 DIAGNOSIS — O30043 Twin pregnancy, dichorionic/diamniotic, third trimester: Secondary | ICD-10-CM

## 2016-05-30 DIAGNOSIS — O30049 Twin pregnancy, dichorionic/diamniotic, unspecified trimester: Secondary | ICD-10-CM | POA: Diagnosis present

## 2016-05-30 LAB — CBC
HEMATOCRIT: 29 % — AB (ref 36.0–46.0)
HEMOGLOBIN: 10.1 g/dL — AB (ref 12.0–15.0)
MCH: 30.3 pg (ref 26.0–34.0)
MCHC: 34.8 g/dL (ref 30.0–36.0)
MCV: 87.1 fL (ref 78.0–100.0)
Platelets: 241 10*3/uL (ref 150–400)
RBC: 3.33 MIL/uL — AB (ref 3.87–5.11)
RDW: 13.2 % (ref 11.5–15.5)
WBC: 4.8 10*3/uL (ref 4.0–10.5)

## 2016-05-30 LAB — TYPE AND SCREEN
ABO/RH(D): O POS
Antibody Screen: NEGATIVE

## 2016-05-30 LAB — WET PREP, GENITAL
Clue Cells Wet Prep HPF POC: NONE SEEN
Sperm: NONE SEEN
Trich, Wet Prep: NONE SEEN
Yeast Wet Prep HPF POC: NONE SEEN

## 2016-05-30 MED ORDER — LACTATED RINGERS IV SOLN
2.0000 g/h | INTRAVENOUS | Status: DC
  Administered 2016-05-30 – 2016-05-31 (×2): 2 g/h via INTRAVENOUS
  Filled 2016-05-30 (×2): qty 80

## 2016-05-30 MED ORDER — PRENATAL MULTIVITAMIN CH
1.0000 | ORAL_TABLET | Freq: Every day | ORAL | Status: DC
  Administered 2016-06-01 – 2016-06-02 (×2): 1 via ORAL
  Filled 2016-05-30 (×2): qty 1

## 2016-05-30 MED ORDER — MAGNESIUM SULFATE BOLUS VIA INFUSION
4.0000 g | Freq: Once | INTRAVENOUS | Status: AC
  Administered 2016-05-30: 4 g via INTRAVENOUS
  Filled 2016-05-30: qty 500

## 2016-05-30 MED ORDER — LACTATED RINGERS IV SOLN
INTRAVENOUS | Status: DC
  Administered 2016-05-30 – 2016-06-01 (×5): via INTRAVENOUS

## 2016-05-30 MED ORDER — CALCIUM CARBONATE ANTACID 500 MG PO CHEW: 2.0000 | CHEWABLE_TABLET | ORAL | Status: DC | PRN

## 2016-05-30 MED ORDER — GUAIFENESIN ER 600 MG PO TB12
600.0000 mg | ORAL_TABLET | Freq: Two times a day (BID) | ORAL | Status: DC | PRN
  Administered 2016-05-30 – 2016-06-01 (×3): 600 mg via ORAL
  Filled 2016-05-30 (×5): qty 1

## 2016-05-30 MED ORDER — DOCUSATE SODIUM 100 MG PO CAPS
100.0000 mg | ORAL_CAPSULE | Freq: Every day | ORAL | Status: DC
  Administered 2016-06-01: 100 mg via ORAL
  Filled 2016-05-30 (×2): qty 1

## 2016-05-30 MED ORDER — ACETAMINOPHEN 325 MG PO TABS
650.0000 mg | ORAL_TABLET | ORAL | Status: DC | PRN
  Administered 2016-06-01: 650 mg via ORAL
  Filled 2016-05-30: qty 2

## 2016-05-30 MED ORDER — BETAMETHASONE SOD PHOS & ACET 6 (3-3) MG/ML IJ SUSP
12.0000 mg | INTRAMUSCULAR | Status: AC
  Administered 2016-05-30 – 2016-05-31 (×2): 12 mg via INTRAMUSCULAR
  Filled 2016-05-30 (×2): qty 2

## 2016-05-30 NOTE — H&P (Signed)
OBSTETRIC ADMISSION HISTORY AND PHYSICAL  Maria Olson is a 25 y.o. female [id number] with didi twin IUP at 4460w3d by LMP and US presenting for preterm labor. She reports regular painful ctx since 10 am today. She reports +FMs, No LOF, no VB, no blurry vision, headaches or peripheral edema, and RUQ pain.   Dating: By LMP and 6w US --->  Estimated Date of Delivery: 07/22/16  Sono:    @[redacted]w[redacted]d , CWD, normal anatomy, Twin A breech and Twin B vtx presentation, EFW A-58%ile, B-23%ile; 20% discordinance  Prenatal History/Complications:  Past Medical History: Past Medical History:  Diagnosis Date  . Anemia   . Infection    UTI  . Ovarian cyst   . Preterm labor     Past Surgical History: Past Surgical History:  Procedure Laterality Date  . CESAREAN SECTION    . INDUCED ABORTION      Obstetrical History: OB History    Gravida Para Term Preterm AB Living   7 3 2 1 3 3    SAB TAB Ectopic Multiple Live Births   1 2 0 0 3      Social History: Social History   Social History  . Marital status: Single    Spouse name: N/A  . Number of children: N/A  . Years of education: N/A   Social History Main Topics  . Smoking status: Never Smoker  . Smokeless tobacco: Never Used  . Alcohol use No  . Drug use: No  . Sexual activity: Yes    Birth control/ protection: None   Other Topics Concern  . None   Social History Narrative  . None    Family History: Family History  Problem Relation Age of Onset  . Asthma Brother   . Cancer Paternal Grandmother   . Asthma Daughter     Allergies: No Known Allergies  Prescriptions Prior to Admission  Medication Sig Dispense Refill Last Dose  . acetaminophen (TYLENOL) 500 MG tablet Take 1,000 mg by mouth every 6 (six) hours as needed for mild pain, moderate pain or headache.    Past Week at Unknown time  . guaiFENesin (ROBITUSSIN) 100 MG/5ML liquid Take 200 mg by mouth 3 (three) times daily as needed for cough.   05/29/2016 at Unknown time   . ondansetron (ZOFRAN ODT) 4 MG disintegrating tablet Take 1 tablet (4 mg total) by mouth every 6 (six) hours as needed for nausea. 30 tablet 2 Past Week at Unknown time  . Prenatal Vit-Fe Fumarate-FA (PRENATAL MULTIVITAMIN) TABS tablet Take 1 tablet by mouth daily.   05/30/2016 at Unknown time  . ranitidine (ZANTAC) 150 MG tablet Take 1 tablet (150 mg total) by mouth 2 (two) times daily. 60 tablet 1 05/30/2016 at Unknown time     Review of Systems   All systems reviewed and negative except as stated in HPI  Blood pressure 121/71, pulse 97, temperature 97.5 F (36.4 C), temperature source Oral, resp. rate 18, weight 98 kg (216 lb 1.9 oz), last menstrual period 10/16/2015, SpO2 100 %, unknown if currently breastfeeding. General appearance: alert, cooperative and no distress Lungs: clear to auscultation bilaterally Heart: regular rate and rhythm Abdomen: soft, non-tender; bowel sounds normal Extremities: Homans sign is negative, no sign of DVT Presentation: breech  EFM-A: 130 bpm, mod variability, + accels, no decels EFM-B: 140 bpm, mod variability, + accels, no decels Toco: q3-5  Dilation: 3 Effacement (%): 50 Exam by:: Donette LarryMelanie Emileo Semel CNM  Breech  Prenatal labs: ABO, Rh: O/POS/-- (05/23 1402)  Antibody: NEG (05/23 1402) Rubella: 11.30 (05/23 1402) RPR: NON REAC (09/21 1054)  HBsAg: NEGATIVE (05/23 1402)  HIV: NONREACTIVE (09/21 1054)  GBS:    1 hr Glucola 84 Genetic screening  nml Anatomy US nml  Prenatal Transfer Tool  Maternal Diabetes: No Genetic Screening: Normal Maternal Ultrasounds/Referrals: Normal Fetal Ultrasounds or other Referrals:  None Maternal Substance Abuse:  No Significant Maternal Medications:  None Significant Maternal Lab Results: None  Results for orders placed or performed during the hospital encounter of 05/30/16 (from the past 24 hour(s))  Wet prep, genital   Collection Time: 05/30/16  9:25 PM  Result Value Ref Range   Yeast Wet Prep HPF  POC NONE SEEN NONE SEEN   Trich, Wet Prep NONE SEEN NONE SEEN   Clue Cells Wet Prep HPF POC NONE SEEN NONE SEEN   WBC, Wet Prep HPF POC FEW (A) NONE SEEN   Sperm NONE SEEN     Patient Active Problem List   Diagnosis Date Noted  . Premature labor 05/30/2016  . Hx successful VBAC (vaginal birth after cesarean), currently pregnant 01/09/2016  . Hx of cesarean section 01/09/2016  . Hx of fetal anomaly in prior pregnancy, currently pregnant 01/09/2016  . Hx of preterm delivery, currently pregnant 01/09/2016  . Supervision of other high risk pregnancy, antepartum 12/01/2015  . Dichorionic diamniotic twin pregnancy, antepartum 12/01/2015    Assessment: Maria Olson is a 25 y.o. [phone] at [redacted]w[redacted]d didi twins here for PTL Cat I FHT x2  Plan: Admit Magnesium Sulfate BMZ MD to manage   Donette Larry, CNM  05/30/2016, 9:47 PM

## 2016-05-30 NOTE — MAU Note (Signed)
Patient presents to mau with c/o contractions that started at around 930 this am and has progressively gotten worse. Reports mucus like discharge, denies vaginal bleeding, denies leaking of fluid. +FM.

## 2016-05-30 NOTE — MAU Provider Note (Signed)
History     CSN: [id number]  Arrival date and time: 05/30/16 1956   None     Chief Complaint  Patient presents with  . Contractions   [id number] @32 .3 weeks c/o ctx q3-5 min since 0900. She denies VB and LOF. She reports passing mucous prior to ctx onset. She reports good FM since arrival. She denies vaginal discharge, odor, and itching. She denies recent IC.     OB History    Gravida Para Term Preterm AB Living   7 3 2 1 3 3    SAB TAB Ectopic Multiple Live Births   1 2 0 0 3      Past Medical History:  Diagnosis Date  . Anemia   . Infection    UTI  . Ovarian cyst   . Preterm labor     Past Surgical History:  Procedure Laterality Date  . CESAREAN SECTION    . INDUCED ABORTION      Family History  Problem Relation Age of Onset  . Asthma Brother   . Cancer Paternal Grandmother   . Asthma Daughter     Social History  Substance Use Topics  . Smoking status: Never Smoker  . Smokeless tobacco: Never Used  . Alcohol use No    Allergies: No Known Allergies  Prescriptions Prior to Admission  Medication Sig Dispense Refill Last Dose  . acetaminophen (TYLENOL) 500 MG tablet Take 1,000 mg by mouth every 6 (six) hours as needed for mild pain, moderate pain or headache.    Past Week at Unknown time  . guaiFENesin (ROBITUSSIN) 100 MG/5ML liquid Take 200 mg by mouth 3 (three) times daily as needed for cough.   05/29/2016 at Unknown time  . ondansetron (ZOFRAN ODT) 4 MG disintegrating tablet Take 1 tablet (4 mg total) by mouth every 6 (six) hours as needed for nausea. 30 tablet 2 Past Week at Unknown time  . Prenatal Vit-Fe Fumarate-FA (PRENATAL MULTIVITAMIN) TABS tablet Take 1 tablet by mouth daily.   05/30/2016 at Unknown time  . ranitidine (ZANTAC) 150 MG tablet Take 1 tablet (150 mg total) by mouth 2 (two) times daily. 60 tablet 1 05/30/2016 at Unknown time    Review of Systems  Constitutional: Negative.   Gastrointestinal: Positive for abdominal pain. Negative  for constipation and diarrhea.  Genitourinary: Negative.    Physical Exam   Blood pressure 121/71, pulse 97, temperature 97.5 F (36.4 C), temperature source Oral, resp. rate 18, weight 98 kg (216 lb 1.9 oz), last menstrual period 10/16/2015, SpO2 100 %, unknown if currently breastfeeding.  Physical Exam  Constitutional: She is oriented to person, place, and time. She appears well-developed and well-nourished.  HENT:  Head: Normocephalic and atraumatic.  Neck: Normal range of motion. Neck supple.  Cardiovascular: Normal rate.   Respiratory: Effort normal.  GI: Soft. She exhibits no distension. There is no tenderness.  gravid  Genitourinary:  Genitourinary Comments: External: no lesions Vagina: rugated, parous, thin white discharge SVE: 3/50/-3, breech   Musculoskeletal: Normal range of motion.  Neurological: She is alert and oriented to person, place, and time.  Skin: Skin is warm and dry.  Psychiatric: She has a normal mood and affect.   EFM-A: 130 bpm, mod variability, + accels, no decels EFM-B: 140 bpm, mod variability, + accels, no decels Toco: 3-5  MAU Course  Procedures  MDM Discussed presentation and exam findings with Dr. Adrian Blackwater. MD to bedside and plan for admit.  Assessment and Plan  32.2 weeks didi twin  gestation Preterm labor Malpresentation  Admit See H&P   Maria Olson, CNM 05/30/2016, 9:12 PM

## 2016-05-30 NOTE — MAU Note (Signed)
Patient states this is a twin pregnancy; last appointment she said baby a was breech and baby b was head down. Was 1 cm on Tuesday in office.

## 2016-05-31 DIAGNOSIS — Z3A37 37 weeks gestation of pregnancy: Secondary | ICD-10-CM

## 2016-05-31 DIAGNOSIS — O321XX1 Maternal care for breech presentation, fetus 1: Secondary | ICD-10-CM

## 2016-05-31 DIAGNOSIS — O30043 Twin pregnancy, dichorionic/diamniotic, third trimester: Secondary | ICD-10-CM

## 2016-05-31 DIAGNOSIS — O34211 Maternal care for low transverse scar from previous cesarean delivery: Secondary | ICD-10-CM

## 2016-05-31 DIAGNOSIS — O321XX2 Maternal care for breech presentation, fetus 2: Secondary | ICD-10-CM

## 2016-05-31 LAB — GC/CHLAMYDIA PROBE AMP (~~LOC~~) NOT AT ARMC
CHLAMYDIA, DNA PROBE: NEGATIVE
Neisseria Gonorrhea: NEGATIVE

## 2016-05-31 MED ORDER — ONDANSETRON HCL 4 MG/2ML IJ SOLN: 4.0000 mg | Freq: Four times a day (QID) | INTRAMUSCULAR | Status: DC | PRN

## 2016-05-31 MED ORDER — BUTORPHANOL TARTRATE 1 MG/ML IJ SOLN
1.0000 mg | Freq: Once | INTRAMUSCULAR | Status: AC
  Administered 2016-05-31: 1 mg via INTRAVENOUS
  Filled 2016-05-31: qty 1

## 2016-05-31 MED ORDER — ONDANSETRON HCL 4 MG/2ML IJ SOLN: 4.0000 mg | Freq: Four times a day (QID) | INTRAMUSCULAR | Status: DC

## 2016-05-31 MED ORDER — PANTOPRAZOLE SODIUM 40 MG PO TBEC
40.0000 mg | DELAYED_RELEASE_TABLET | Freq: Every day | ORAL | Status: DC
  Administered 2016-05-31 – 2016-06-02 (×3): 40 mg via ORAL
  Filled 2016-05-31 (×3): qty 1

## 2016-05-31 MED ORDER — ALUM & MAG HYDROXIDE-SIMETH 200-200-20 MG/5ML PO SUSP
30.0000 mL | ORAL | Status: DC | PRN
  Administered 2016-05-31 (×2): 30 mL via ORAL
  Filled 2016-05-31 (×2): qty 30

## 2016-05-31 NOTE — Anesthesia Pain Management Evaluation Note (Signed)
  CRNA Pain Management Visit Note  Patient: Maria Olson, 25 y.o., female  "Hello I am a member of the anesthesia team at [facility]. We have an anesthesia team available at all times to provide care throughout the hospital, including epidural management and anesthesia for C-section. I don't know your plan for the delivery whether it a natural birth, water birth, IV sedation, nitrous supplementation, doula or epidural, but we want to meet your pain goals."   1.Was your pain managed to your expectations on prior hospitalizations?   Yes   2.What is your expectation for pain management during this hospitalization?     Spinal for repeat C-section  3.How can we help you reach that goal?   Record the patient's initial score and the patient's pain goal.   Pain: 8  Pain Goal: 5 [facility] wants you to be able to say your pain was always managed very well.  Sanders Manninen 05/31/2016

## 2016-05-31 NOTE — Progress Notes (Signed)
Maria Olson is a 25 y.o. [id number] at 5344w4d by LMP admitted for Preterm labor  Subjective: Patient complaining of increased pain with contractions. Rating pain 10/10. Good fetal movement.  Objective: BP 136/63   Pulse 95   Temp 98.1 F (36.7 C) (Oral)   Resp 18   Ht 5\' 3"  (1.6 m)   Wt 216 lb (98 kg)   LMP 10/16/2015   SpO2 100%   BMI 38.26 kg/m  No intake/output data recorded. Total I/O In: 796.3 [I.V.:796.3] Out: 700 [Urine:700]  Gen: Very comfortable appearing in bed. FHT:  FHR: 125/130 bpm, variability: moderate,  accelerations:  Present,  decelerations:  Absent UC:   3-4 an hour SVE:   Dilation: 3 Effacement (%): 60 Station: -3 Exam by:: dr. Adrian Blackwaterstinson  Labs: Lab Results  Component Value Date   WBC 4.8 05/30/2016   HGB 10.1 (L) 05/30/2016   HCT 29.0 (L) 05/30/2016   MCV 87.1 05/30/2016   PLT 241 05/30/2016    Assessment / Plan: 1. Preterm Labor  Although pt complaining of increased pain, appears very comfortable. Talking freely.  No cervical change  Continue NPO  Continue Magnesium  Continue BMZ 2. Previous cesarean  Plan repeat cesarean as babies breech/vtx 3. Twins  Category 1 tracing for both babies.  On continuous monitoring for now.  Levie HeritageJacob J Stinson, DO  05/31/2016, 4:42 AM

## 2016-06-01 MED ORDER — PROMETHAZINE HCL 25 MG/ML IJ SOLN
12.5000 mg | Freq: Once | INTRAMUSCULAR | Status: AC
Start: 2016-06-01 — End: 2016-06-01
  Administered 2016-06-01: 12.5 mg via INTRAVENOUS
  Filled 2016-06-01: qty 1

## 2016-06-01 MED ORDER — NALBUPHINE HCL 10 MG/ML IJ SOLN
10.0000 mg | Freq: Once | INTRAMUSCULAR | Status: AC
  Administered 2016-06-01: 10 mg via INTRAVENOUS
  Filled 2016-06-01: qty 1

## 2016-06-01 MED ORDER — ZOLPIDEM TARTRATE 5 MG PO TABS: 5.0000 mg | ORAL_TABLET | Freq: Every evening | ORAL | Status: DC | PRN

## 2016-06-01 MED ORDER — NIFEDIPINE ER OSMOTIC RELEASE 30 MG PO TB24
30.0000 mg | ORAL_TABLET | Freq: Two times a day (BID) | ORAL | Status: DC
  Administered 2016-06-01 – 2016-06-02 (×3): 30 mg via ORAL
  Filled 2016-06-01 (×3): qty 1

## 2016-06-01 MED ORDER — GUAIFENESIN ER 600 MG PO TB12: 1200.0000 mg | ORAL_TABLET | Freq: Two times a day (BID) | ORAL | Status: DC

## 2016-06-01 MED ORDER — PSEUDOEPHEDRINE HCL 60 MG PO TABS: 60.0000 mg | ORAL_TABLET | ORAL | 0 refills | Status: DC | PRN

## 2016-06-01 NOTE — Progress Notes (Signed)
OB Attending CTSP due to vaginal pressure and back pain. Denies ut ctx, cramps, VB or LOF. Reports + FM x 2 Has been up to restroom without problems. BM today Tolerating procardia SVE no change A/P IUP  32 5/7 weeks twin gestation Breech/Breech        Preterm uterine ctx        H/O VBAC No evidence of labor presently. Suspect muscle skeletal source of pain. Will give IV pain medication and continue to observe.

## 2016-06-01 NOTE — Progress Notes (Signed)
FACULTY PRACTICE ANTEPARTUM(COMPREHENSIVE) NOTE  Maria Olson is a 25 y.o. [phone] at 1518w5d by early ultrasound who is admitted for Preterm labor.   Fetal presentation is breech/breech Length of Stay:  2  Days  Subjective:  Patient reports the fetal movement as active. Patient reports uterine contraction  activity as none. Patient reports  vaginal bleeding as none. Patient describes fluid per vagina as None.  Vitals:  Blood pressure (!) 111/54, pulse 99, temperature 98.1 F (36.7 C), temperature source Oral, resp. rate 16, height 5\' 3"  (1.6 m), weight 98 kg (216 lb), last menstrual period 10/16/2015, SpO2 100 %, unknown if currently breastfeeding. Physical Examination:  General appearance - alert, well appearing, and in no distress Heart - normal rate and regular rhythm Abdomen - soft, nontender, nondistended Fundal Height:  consistent with twins Cervical Exam: Not evaluated.  Extremities: extremities normal, atraumatic, no cyanosis or edema and Homans sign is negative, no sign of DVT  Membranes:intact  Fetal Monitoring:     Fetal Heart Rate A  Mode External filed at 05/31/2016 2306  Baseline Rate (A) 135 bpm filed at 05/31/2016 2306  Variability 6-25 BPM filed at 05/31/2016 2306  Accelerations 15 x 15 filed at 05/31/2016 2306  Decelerations None filed at 05/31/2016 2306  Fetal Heart Rate Fetus B  Mode External filed at 05/31/2016 2306  Baseline Rate (B) 135 BPM filed at 05/31/2016 2306  Variability 6-25 BPM filed at 05/31/2016 2306  Accelerations 15 x 15 filed at 05/31/2016 2306  Decelerations None filed at 05/31/2016 2306     Labs:  No results found for this or any previous visit (from the past 24 hour(s)).  Imaging Studies:     Currently EPIC will not allow sonographic studies to automatically populate into notes.  In the meantime, copy and paste results into note or free text.  Medications:  Scheduled . docusate sodium  100 mg Oral Daily  . NIFEdipine  30 mg  Oral BID  . pantoprazole  40 mg Oral Daily  . prenatal multivitamin  1 tablet Oral Q1200   I have reviewed the patient's current medications.  ASSESSMENT: Patient Active Problem List   Diagnosis Date Noted  . Premature labor 05/30/2016  . Hx successful VBAC (vaginal birth after cesarean), currently pregnant 01/09/2016  . Previous cesarean delivery, antepartum condition or complication 01/09/2016  . Hx of fetal anomaly in prior pregnancy, currently pregnant 01/09/2016  . Hx of preterm delivery, currently pregnant 01/09/2016  . Supervision of other high risk pregnancy, antepartum 12/01/2015  . Dichorionic diamniotic twin pregnancy, antepartum 12/01/2015    PLAN: D/C magnesium, start procardia, observe for s/sx of PTL Verdell Dykman 06/01/2016,7:46 AM

## 2016-06-02 MED ORDER — NIFEDIPINE ER 30 MG PO TB24: 30.0000 mg | ORAL_TABLET | Freq: Two times a day (BID) | ORAL | 3 refills | Status: DC

## 2016-06-02 NOTE — Discharge Instructions (Signed)

## 2016-06-02 NOTE — Progress Notes (Signed)
ACULTY PRACTICE ANTEPARTUM COMPREHENSIVE PROGRESS NOTE  Maria Olson is a 25 y.o. [phone] at 535w6d twins who is admitted for Preterm labor.   Fetal presentation is breech/breech Length of Stay:  3  Days  Subjective: Pt denies any vaginal pressure, cramps or ut ctx this Am. Reports + FM x 2. Tolerating Procardia. Up to rest room without problems. She denies any VB or LOF.  Vitals:  Blood pressure (!) 136/44, pulse (!) 108, temperature 98.8 F (37.1 C), temperature source Oral, resp. rate 18, height 5\' 3"  (1.6 m), weight 216 lb (98 kg), last menstrual period 10/16/2015, SpO2 100 %, unknown if currently breastfeeding.   Physical Examination: Lungs clear Heart RRR Abd soft + BS gravid non tender Ext SCD's   Fetal Monitoring:  Baseline: 120-140's x 2 bpm  Labs:  No results found for this or any previous visit (from the past 24 hour(s)).  Imaging Studies:       Medications:  Scheduled . docusate sodium  100 mg Oral Daily  . NIFEdipine  30 mg Oral BID  . pantoprazole  40 mg Oral Daily  . prenatal multivitamin  1 tablet Oral Q1200   I have reviewed the patient's current medications.  ASSESSMENT: Patient Active Problem List   Diagnosis Date Noted  . Premature labor 05/30/2016  . Hx successful VBAC (vaginal birth after cesarean), currently pregnant 01/09/2016  . Previous cesarean delivery, antepartum condition or complication 01/09/2016  . Hx of fetal anomaly in prior pregnancy, currently pregnant 01/09/2016  . Hx of preterm delivery, currently pregnant 01/09/2016  . Supervision of other high risk pregnancy, antepartum 12/01/2015  . Dichorionic diamniotic twin pregnancy, antepartum 12/01/2015    PLAN: Pt has tolerated being off magnesium and on Procardia. S/P BMZ. Will d/c IV fluids and increase activities today. Continue to monitor. Possible d/c home later today and or tomorrow if tolerates activity change  Hermina StaggersMichael L Leiah Giannotti 06/02/2016,6:48 AM

## 2016-06-02 NOTE — Discharge Summary (Signed)
Physician Discharge Summary  Patient ID: Maria Olson MRN: [id number] DOB/AGE: [birth year] 25 y.o.  Admit date: 05/30/2016 Discharge date: 06/02/2016  Admission Diagnoses: preterm labor with Di-Di twins  Discharge Diagnoses:  Active Problems:   Dichorionic diamniotic twin pregnancy, antepartum   Premature labor   Discharged Condition: good  Hospital Course: Patient admitted with preterm labor at 32 weeks. She received magnesium sulfate for tocolysis and switched to procardia which stopped her contractions. She completed a BMZ course as well. Her cervical exam remained unchanged since admission. Patient was eager to be discharge. Precautions were reviewed with the patient. Patient to continue tocolysis upon discharge with procardia. Follow up for prenatal care as scheduled in 2 weeks  Consults: None  Significant Diagnostic Studies:   Treatments: tocolysis with magnesium sulfate and later with procardia. BMX x 2  Discharge Exam: Blood pressure (!) 117/57, pulse (!) 102, temperature 98.6 F (37 C), resp. rate 18, height 5\' 3"  (1.6 m), weight 98 kg (216 lb), last menstrual period 10/16/2015, SpO2 100 %, unknown if currently breastfeeding. See am note  Disposition: 01-Home or Self Care     Medication List    TAKE these medications   acetaminophen 500 MG tablet Commonly known as:  TYLENOL Take 1,000 mg by mouth every 6 (six) hours as needed for mild pain, moderate pain or headache.   guaiFENesin 100 MG/5ML liquid Commonly known as:  ROBITUSSIN Take 200 mg by mouth 3 (three) times daily as needed for cough.   NIFEdipine 30 MG 24 hr tablet Commonly known as:  PROCARDIA-XL/ADALAT CC Take 1 tablet (30 mg total) by mouth 2 (two) times daily.   ondansetron 4 MG disintegrating tablet Commonly known as:  ZOFRAN ODT Take 1 tablet (4 mg total) by mouth every 6 (six) hours as needed for nausea.   prenatal multivitamin Tabs tablet Take 1 tablet by mouth daily.    pseudoephedrine 60 MG tablet Commonly known as:  SUDAFED Take 1 tablet (60 mg total) by mouth every 4 (four) hours as needed for congestion.   ranitidine 150 MG tablet Commonly known as:  ZANTAC Take 1 tablet (150 mg total) by mouth 2 (two) times daily.        Signed: Mackensie Pilson 06/02/2016, 3:09 PM

## 2016-06-07 ENCOUNTER — Telehealth: Payer: Self-pay | Admitting: *Deleted

## 2016-06-07 NOTE — Telephone Encounter (Signed)
Pt left message stating that she is pregnant with twins and learned during her hospitalization that she will be delivered by C/S.  She wants to know what she needs to do in order to get that scheduled. I returned pt's call and advised that her C/S will be discussed and subsequently scheduled at the time of her next prenatal visit on 11/1.  There is nothing she needs to do at this time. Pt voiced understanding.

## 2016-06-13 ENCOUNTER — Inpatient Hospital Stay (HOSPITAL_COMMUNITY)
Admission: AD | Admit: 2016-06-13 | Discharge: 2016-06-14 | Disposition: A | Discharge status: Home / Self Care | Payer: Medicaid Other | Source: Ambulatory Visit | Attending: Obstetrics and Gynecology | Admitting: Obstetrics and Gynecology

## 2016-06-13 ENCOUNTER — Encounter (HOSPITAL_COMMUNITY): Payer: Self-pay | Admitting: *Deleted

## 2016-06-13 DIAGNOSIS — O09219 Supervision of pregnancy with history of pre-term labor, unspecified trimester: Secondary | ICD-10-CM

## 2016-06-13 DIAGNOSIS — O30049 Twin pregnancy, dichorionic/diamniotic, unspecified trimester: Secondary | ICD-10-CM

## 2016-06-13 DIAGNOSIS — O09299 Supervision of pregnancy with other poor reproductive or obstetric history, unspecified trimester: Secondary | ICD-10-CM

## 2016-06-13 DIAGNOSIS — O34219 Maternal care for unspecified type scar from previous cesarean delivery: Secondary | ICD-10-CM

## 2016-06-13 DIAGNOSIS — O09899 Supervision of other high risk pregnancies, unspecified trimester: Secondary | ICD-10-CM

## 2016-06-13 NOTE — MAU Note (Signed)
PT  SAYS SHE HAS TWINS  -  HAS BEEN   HAVING UC    SINCE 7PM-   [facility]-   CLINIC  .  WAS HOSPITALIZED   HERE  2 WEEKS  AGO-  WAS 3  CM .     NO C[facility]S   [facility].         LAST SEX-  [facility].

## 2016-06-14 ENCOUNTER — Encounter (HOSPITAL_COMMUNITY)
Admission: AD | Disposition: A | Discharge status: Home / Self Care | Payer: Self-pay | Source: Ambulatory Visit | Attending: Obstetrics and Gynecology

## 2016-06-14 ENCOUNTER — Inpatient Hospital Stay (HOSPITAL_COMMUNITY): Payer: Medicaid Other | Admitting: Certified Registered Nurse Anesthetist

## 2016-06-14 ENCOUNTER — Ambulatory Visit (HOSPITAL_COMMUNITY)
Admission: RE | Admit: 2016-06-14 | Discharge: 2016-06-14 | Disposition: A | Discharge status: Home / Self Care | Payer: Medicaid Other | Source: Ambulatory Visit | Attending: Certified Nurse Midwife | Admitting: Certified Nurse Midwife

## 2016-06-14 ENCOUNTER — Encounter (HOSPITAL_COMMUNITY): Payer: Self-pay

## 2016-06-14 ENCOUNTER — Encounter (HOSPITAL_COMMUNITY): Payer: Self-pay | Admitting: *Deleted

## 2016-06-14 ENCOUNTER — Inpatient Hospital Stay (HOSPITAL_COMMUNITY)
Admission: AD | Admit: 2016-06-14 | Discharge: 2016-06-16 | DRG: 765 | Disposition: A | Discharge status: Home / Self Care | Payer: Medicaid Other | Source: Ambulatory Visit | Attending: Obstetrics and Gynecology | Admitting: Obstetrics and Gynecology

## 2016-06-14 DIAGNOSIS — Z880 Allergy status to penicillin: Secondary | ICD-10-CM | POA: Diagnosis not present

## 2016-06-14 DIAGNOSIS — Z6838 Body mass index (BMI) 38.0-38.9, adult: Secondary | ICD-10-CM | POA: Diagnosis not present

## 2016-06-14 DIAGNOSIS — Z98891 History of uterine scar from previous surgery: Secondary | ICD-10-CM

## 2016-06-14 DIAGNOSIS — O30043 Twin pregnancy, dichorionic/diamniotic, third trimester: Secondary | ICD-10-CM | POA: Diagnosis present

## 2016-06-14 DIAGNOSIS — O9962 Diseases of the digestive system complicating childbirth: Secondary | ICD-10-CM | POA: Diagnosis present

## 2016-06-14 DIAGNOSIS — O09299 Supervision of pregnancy with other poor reproductive or obstetric history, unspecified trimester: Secondary | ICD-10-CM

## 2016-06-14 DIAGNOSIS — O321XX1 Maternal care for breech presentation, fetus 1: Secondary | ICD-10-CM | POA: Diagnosis present

## 2016-06-14 DIAGNOSIS — O30049 Twin pregnancy, dichorionic/diamniotic, unspecified trimester: Secondary | ICD-10-CM

## 2016-06-14 DIAGNOSIS — O34211 Maternal care for low transverse scar from previous cesarean delivery: Secondary | ICD-10-CM | POA: Diagnosis present

## 2016-06-14 DIAGNOSIS — O321XX2 Maternal care for breech presentation, fetus 2: Principal | ICD-10-CM | POA: Diagnosis present

## 2016-06-14 DIAGNOSIS — O99214 Obesity complicating childbirth: Secondary | ICD-10-CM | POA: Diagnosis present

## 2016-06-14 DIAGNOSIS — O09219 Supervision of pregnancy with history of pre-term labor, unspecified trimester: Secondary | ICD-10-CM

## 2016-06-14 DIAGNOSIS — K219 Gastro-esophageal reflux disease without esophagitis: Secondary | ICD-10-CM | POA: Diagnosis present

## 2016-06-14 DIAGNOSIS — Z3A34 34 weeks gestation of pregnancy: Secondary | ICD-10-CM

## 2016-06-14 DIAGNOSIS — Z302 Encounter for sterilization: Secondary | ICD-10-CM | POA: Diagnosis not present

## 2016-06-14 DIAGNOSIS — O34219 Maternal care for unspecified type scar from previous cesarean delivery: Secondary | ICD-10-CM

## 2016-06-14 DIAGNOSIS — O09899 Supervision of other high risk pregnancies, unspecified trimester: Secondary | ICD-10-CM

## 2016-06-14 LAB — URINALYSIS, ROUTINE W REFLEX MICROSCOPIC
Bilirubin Urine: NEGATIVE
Glucose, UA: NEGATIVE mg/dL
Ketones, ur: NEGATIVE mg/dL
NITRITE: NEGATIVE
PH: 6.5 (ref 5.0–8.0)
Protein, ur: NEGATIVE mg/dL
SPECIFIC GRAVITY, URINE: 1.02 (ref 1.005–1.030)

## 2016-06-14 LAB — URINE MICROSCOPIC-ADD ON

## 2016-06-14 LAB — CBC
HCT: 28.3 % — ABNORMAL LOW (ref 36.0–46.0)
Hemoglobin: 9.9 g/dL — ABNORMAL LOW (ref 12.0–15.0)
MCH: 30.6 pg (ref 26.0–34.0)
MCHC: 35 g/dL (ref 30.0–36.0)
MCV: 87.3 fL (ref 78.0–100.0)
PLATELETS: 229 10*3/uL (ref 150–400)
RBC: 3.24 MIL/uL — ABNORMAL LOW (ref 3.87–5.11)
RDW: 13.1 % (ref 11.5–15.5)
WBC: 6 10*3/uL (ref 4.0–10.5)

## 2016-06-14 LAB — TYPE AND SCREEN
ABO/RH(D): O POS
ANTIBODY SCREEN: NEGATIVE

## 2016-06-14 SURGERY — Surgical Case
Anesthesia: Spinal

## 2016-06-14 MED ORDER — FENTANYL CITRATE (PF) 100 MCG/2ML IJ SOLN
INTRAMUSCULAR | Status: AC
  Filled 2016-06-14: qty 2

## 2016-06-14 MED ORDER — PHENYLEPHRINE 8 MG IN D5W 100 ML (0.08MG/ML) PREMIX OPTIME
INJECTION | INTRAVENOUS | Status: DC | PRN
  Administered 2016-06-14: 80 ug/min via INTRAVENOUS

## 2016-06-14 MED ORDER — NIFEDIPINE 10 MG PO CAPS
20.0000 mg | ORAL_CAPSULE | Freq: Once | ORAL | Status: AC
  Administered 2016-06-14: 20 mg via ORAL
  Filled 2016-06-14: qty 2

## 2016-06-14 MED ORDER — FENTANYL CITRATE (PF) 100 MCG/2ML IJ SOLN
INTRAMUSCULAR | Status: DC | PRN
  Administered 2016-06-14: 10 ug via INTRATHECAL

## 2016-06-14 MED ORDER — BUPIVACAINE IN DEXTROSE 0.75-8.25 % IT SOLN
INTRATHECAL | Status: DC | PRN
  Administered 2016-06-14: 9 mg via INTRATHECAL

## 2016-06-14 MED ORDER — DEXAMETHASONE SODIUM PHOSPHATE 10 MG/ML IJ SOLN
INTRAMUSCULAR | Status: AC
  Filled 2016-06-14: qty 1

## 2016-06-14 MED ORDER — SCOPOLAMINE 1 MG/3DAYS TD PT72
MEDICATED_PATCH | TRANSDERMAL | Status: DC | PRN
  Administered 2016-06-14: 1 via TRANSDERMAL

## 2016-06-14 MED ORDER — LACTATED RINGERS IV SOLN
INTRAVENOUS | Status: DC | PRN
  Administered 2016-06-14 (×2): via INTRAVENOUS

## 2016-06-14 MED ORDER — OXYTOCIN 10 UNIT/ML IJ SOLN
INTRAMUSCULAR | Status: AC
  Filled 2016-06-14: qty 4

## 2016-06-14 MED ORDER — LACTATED RINGERS IV BOLUS (SEPSIS)
1000.0000 mL | Freq: Once | INTRAVENOUS | Status: AC
  Administered 2016-06-14: 1000 mL via INTRAVENOUS

## 2016-06-14 MED ORDER — MORPHINE SULFATE (PF) 0.5 MG/ML IJ SOLN
INTRAMUSCULAR | Status: DC | PRN
  Administered 2016-06-14: .2 mg via INTRATHECAL

## 2016-06-14 MED ORDER — DEXAMETHASONE SODIUM PHOSPHATE 10 MG/ML IJ SOLN
INTRAMUSCULAR | Status: DC | PRN
  Administered 2016-06-14: 4 mg via INTRAVENOUS

## 2016-06-14 MED ORDER — MORPHINE SULFATE-NACL 0.5-0.9 MG/ML-% IV SOSY
PREFILLED_SYRINGE | INTRAVENOUS | Status: AC
  Filled 2016-06-14: qty 1

## 2016-06-14 MED ORDER — OXYTOCIN 10 UNIT/ML IJ SOLN
INTRAVENOUS | Status: DC | PRN
  Administered 2016-06-14: 40 [IU] via INTRAVENOUS

## 2016-06-14 MED ORDER — CEFAZOLIN SODIUM-DEXTROSE 2-4 GM/100ML-% IV SOLN
2.0000 g | INTRAVENOUS | Status: AC
  Administered 2016-06-14: 2 g via INTRAVENOUS
  Filled 2016-06-14: qty 100

## 2016-06-14 MED ORDER — ONDANSETRON HCL 4 MG/2ML IJ SOLN
INTRAMUSCULAR | Status: AC
  Filled 2016-06-14: qty 2

## 2016-06-14 MED ORDER — SOD CITRATE-CITRIC ACID 500-334 MG/5ML PO SOLN
30.0000 mL | Freq: Once | ORAL | Status: AC
  Administered 2016-06-14: 30 mL via ORAL
  Filled 2016-06-14: qty 15

## 2016-06-14 MED ORDER — ONDANSETRON HCL 4 MG/2ML IJ SOLN
INTRAMUSCULAR | Status: DC | PRN
  Administered 2016-06-14: 4 mg via INTRAVENOUS

## 2016-06-14 MED ORDER — LACTATED RINGERS IV SOLN
INTRAVENOUS | Status: DC | PRN
  Administered 2016-06-14: via INTRAVENOUS

## 2016-06-14 SURGICAL SUPPLY — 35 items
APL SKNCLS STERI-STRIP NONHPOA (GAUZE/BANDAGES/DRESSINGS) ×2
BENZOIN TINCTURE PRP APPL 2/3 (GAUZE/BANDAGES/DRESSINGS) ×3 IMPLANT
BRR ADH 6X5 SEPRAFILM 1 SHT (MISCELLANEOUS)
CHLORAPREP W/TINT 26ML (MISCELLANEOUS) ×4 IMPLANT
CLAMP CORD UMBIL (MISCELLANEOUS) IMPLANT
CLOSURE WOUND 1/2 X4 (GAUZE/BANDAGES/DRESSINGS) ×1
CONTAINER PREFILL 10% NBF 15ML (MISCELLANEOUS) IMPLANT
DRSG OPSITE POSTOP 4X10 (GAUZE/BANDAGES/DRESSINGS) ×4 IMPLANT
DRSG PAD ABDOMINAL 8X10 ST (GAUZE/BANDAGES/DRESSINGS) ×3 IMPLANT
ELECT REM PT RETURN 9FT ADLT (ELECTROSURGICAL) ×4
ELECTRODE REM PT RTRN 9FT ADLT (ELECTROSURGICAL) ×2 IMPLANT
EXTRACTOR VACUUM M CUP 4 TUBE (SUCTIONS) IMPLANT
EXTRACTOR VACUUM M CUP 4' TUBE (SUCTIONS)
GLOVE BIOGEL PI IND STRL 6.5 (GLOVE) ×2 IMPLANT
GLOVE BIOGEL PI IND STRL 7.0 (GLOVE) ×2 IMPLANT
GLOVE BIOGEL PI INDICATOR 6.5 (GLOVE) ×2
GLOVE BIOGEL PI INDICATOR 7.0 (GLOVE) ×2
GLOVE SURG SS PI 6.0 STRL IVOR (GLOVE) ×4 IMPLANT
GOWN STRL REUS W/TWL LRG LVL3 (GOWN DISPOSABLE) ×8 IMPLANT
KIT ABG SYR 3ML LUER SLIP (SYRINGE) IMPLANT
NDL HYPO 25X5/8 SAFETYGLIDE (NEEDLE) IMPLANT
NEEDLE HYPO 25X5/8 SAFETYGLIDE (NEEDLE) IMPLANT
NS IRRIG 1000ML POUR BTL (IV SOLUTION) ×4 IMPLANT
PACK C SECTION WH (CUSTOM PROCEDURE TRAY) ×4 IMPLANT
PAD OB MATERNITY 4.3X12.25 (PERSONAL CARE ITEMS) ×4 IMPLANT
PENCIL SMOKE EVAC W/HOLSTER (ELECTROSURGICAL) ×4 IMPLANT
RTRCTR C-SECT PINK 25CM LRG (MISCELLANEOUS) ×3 IMPLANT
SEPRAFILM MEMBRANE 5X6 (MISCELLANEOUS) IMPLANT
SPONGE GAUZE 4X4 12PLY (GAUZE/BANDAGES/DRESSINGS) ×3 IMPLANT
STRIP CLOSURE SKIN 1/2X4 (GAUZE/BANDAGES/DRESSINGS) ×2 IMPLANT
SUT PLAIN 0 NONE (SUTURE) ×3 IMPLANT
SUT VIC AB 0 CT1 36 (SUTURE) ×16 IMPLANT
SUT VIC AB 4-0 KS 27 (SUTURE) ×4 IMPLANT
TOWEL OR 17X24 6PK STRL BLUE (TOWEL DISPOSABLE) ×4 IMPLANT
TRAY FOLEY CATH SILVER 14FR (SET/KITS/TRAYS/PACK) ×4 IMPLANT

## 2016-06-14 NOTE — MAU Note (Signed)
Pt. States that she is having contractions that started last night and they have gotten worse since the day has gone on.

## 2016-06-14 NOTE — Anesthesia Procedure Notes (Signed)
Spinal  Patient location during procedure: OR End time: 06/14/2016 11:19 PM Staffing Anesthesiologist: Jairo BenJACKSON, Walter Grima Performed: anesthesiologist  Preanesthetic Checklist Completed: patient identified, surgical consent, pre-op evaluation, timeout performed, IV checked, risks and benefits discussed and monitors and equipment checked Spinal Block Patient position: sitting Prep: site prepped and draped and DuraPrep Patient monitoring: heart rate, cardiac monitor, continuous pulse ox and blood pressure Approach: midline Location: L3-4 Injection technique: single-shot Needle Needle type: Quincke  Needle gauge: 25 G Needle length: 9 cm Assessment Sensory level: T4 Additional Notes Pt identified in Operating room.  Monitors applied. Working IV access confirmed. Sterile prep, drape lumbar spine.  1% lido local L 3,4.  #25ga Quincke into clear CSF L 3,4.  9 mg 0.75% Bupivacaine with dextrose, 10 mcg Fentanyl, 0.2 mg morphine injected with asp CSF beginning and end of injection.  Patient asymptomatic, VSS, no heme aspirated, tolerated well.  Sandford Craze Calub Tarnow, MD

## 2016-06-14 NOTE — MAU Provider Note (Signed)
MAU HISTORY AND PHYSICAL  Chief Complaint:  No chief complaint on file.   Maria Olson is a 25 y.o.  [id number]  at 3350w4d presenting for No chief complaint on file. . Patient states she has been having  regular, every 5-7 minutes contractions, none vaginal bleeding, intact membranes, with active fetal movement.    Stopped taking procardia at 34 weeks. Has been feeling increasing contractions today that are more severe than last time she had them.   Past Medical History:  Diagnosis Date  . Anemia   . Infection    UTI  . Ovarian cyst   . Preterm labor     Past Surgical History:  Procedure Laterality Date  . CESAREAN SECTION    . INDUCED ABORTION      Family History  Problem Relation Age of Onset  . Asthma Brother   . Cancer Paternal Grandmother   . Asthma Daughter     Social History  Substance Use Topics  . Smoking status: Never Smoker  . Smokeless tobacco: Never Used  . Alcohol use No    No Known Allergies  Prescriptions Prior to Admission  Medication Sig Dispense Refill Last Dose  . acetaminophen (TYLENOL) 500 MG tablet Take 1,000 mg by mouth every 6 (six) hours as needed for mild pain, moderate pain or headache.    Past Week at Unknown time  . guaiFENesin (ROBITUSSIN) 100 MG/5ML liquid Take 200 mg by mouth 3 (three) times daily as needed for cough.   05/29/2016 at Unknown time  . NIFEdipine (PROCARDIA-XL/ADALAT CC) 30 MG 24 hr tablet Take 1 tablet (30 mg total) by mouth 2 (two) times daily. 60 tablet 3   . ondansetron (ZOFRAN ODT) 4 MG disintegrating tablet Take 1 tablet (4 mg total) by mouth every 6 (six) hours as needed for nausea. 30 tablet 2 Past Week at Unknown time  . Prenatal Vit-Fe Fumarate-FA (PRENATAL MULTIVITAMIN) TABS tablet Take 1 tablet by mouth daily.   05/30/2016 at Unknown time  . pseudoephedrine (SUDAFED) 60 MG tablet Take 1 tablet (60 mg total) by mouth every 4 (four) hours as needed for congestion. 30 tablet 0   . ranitidine (ZANTAC) 150 MG  tablet Take 1 tablet (150 mg total) by mouth 2 (two) times daily. 60 tablet 1 05/30/2016 at Unknown time    Review of Systems - Negative except for what is mentioned in HPI.  Physical Exam  Blood pressure 120/64, pulse 90, temperature 98.2 F (36.8 C), temperature source Oral, resp. rate 20, height 5\' 3"  (1.6 m), weight 217 lb 4 oz (98.5 kg), last menstrual period 10/16/2015, unknown if currently breastfeeding. GENERAL: Well-developed, well-nourished female in no acute distress.  LUNGS: No respiratory distress HEART: Regular rate ABDOMEN: Soft, nontender, nondistended, gravid abdomen EXTREMITIES: Nontender, no edema, 2+ distal pulses. Presentation: breech by US (baby A) FHT:  Baby A baseline 160, moderate variability, accelerations present no decelerations Baby B baseline 150 moderate variability, accelerations present no decelerations Contractions: irritable by toco   Labs: No results found for this or any previous visit (from the past 24 hour(s)).  Imaging Studies:  [place] M[facility] Ob Follow Up  Result Date: 05/17/2016 OBSTETRICAL ULTRASOUND: This exam was performed within a [place] [facility] Department. The OB US report was generated in the AS system, and faxed to the ordering physician.  This report is available in the [employer] PACS. See the AS Obstetric US report via the Image Link.  [place] M[facility] Ob Follow Up Addl Gest  Result Date:  05/17/2016 OBSTETRICAL ULTRASOUND: This exam was performed within a [place] Ultrasound Department. The OB US report was generated in the AS system, and faxed to the ordering physician.  This report is available in the [employer]. See the AS Obstetric US report via the Image Link.   Assessment: Maria Olson is  25 y.o. [id number] at [redacted]w[redacted]d presents with No chief complaint on file. . MDM UA Cervical exam- unchanged from prior  Plan: Preterm contractions 20mg  Procardia given in MAU with improvement in contractions Continue taking Procardia at home  until 36 weeks, discuss with OB at appointment on 06/19/16 Encouraged adequate water intake to help with contractions Reassurance provided Return precautions given Patient verbalized understanding and agreement with plan  Tillman Sers, DO PGY-1 10/27/201712:51 AM  CNM attestation:  I have seen and examined this patient; I agree with above documentation in the resident's note.   Maria Olson is a 25 y.o. [id number] reporting contractions. +FM, denies LOF, VB, vaginal discharge.  PE: BP 123/71 (BP Location: Right Arm)   Pulse 91   Temp 97.9 F (36.6 C) (Oral)   Resp 16   Ht 5\' 3"  (1.6 m)   Wt 217 lb 4 oz (98.5 kg)   LMP 10/16/2015   BMI 38.48 kg/m  Gen: calm comfortable, NAD Resp: normal effort, no distress Abd: gravid  ROS, labs, PMH reviewed NST reactive Baby A: 160, moderate with 15x15 accels, no decels Baby B 150, moderate with 15x15 accels, no decels Toco: some UI   Plan: - fetal kick counts reinforced, preterm labor precautions - continue routine follow up in OB clinic  Tawnya Crook, CNM 2:41 AM

## 2016-06-14 NOTE — H&P (Signed)
HPI: Maria Olson is a 25 y.o. year old G7P2133 female at 396w4d weeks gestation w/ di/di twins who presents to MAU reporting worsening contractions now every 4-5 minutes. Has been evaluated for preterm contractions several times in the past two weeks and has been 3/60/-3. Is now 5/100/-2 w/ ?foot in cervix.   Received BMZ on 10/12 and 10/13. Plans C/S for breech baby A. 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    [facility] Prenatal Labs  Dating LMP/6 week US Blood type: --/--/O POS (11/22 1219)   Genetic Screen 1 Screen: Nml  AFP: Neg  NIPS: Antibody: neg  Anatomic US Normal males Rubella:  Immune  GTT Early:74               Third trimester:  RPR:   NR  Flu vaccine declined HBsAg:   Neg  TDaP vaccine   05/09/16                                            Rhogam: NA HIV: Non Reactive (03/28 1816)   Baby Food  breast                               GBS: (For PCN allergy, check sensitivities)  Contraception BTL consent 03/06/16 Pap: negative (01/09/16)  Circumcision Yes  Previous C/S Previous VBAC  Pediatrician Dr Caron Presumeuben    Support Person FOB     Patient Active Problem List   Diagnosis Date Noted  . Premature labor 05/30/2016  . Hx successful VBAC (vaginal birth after cesarean), currently pregnant 01/09/2016  . Previous cesarean delivery, antepartum condition or complication 01/09/2016  . Hx of fetal anomaly in prior pregnancy, currently pregnant 01/09/2016  . Hx of preterm delivery, currently pregnant 01/09/2016  . Supervision of other high risk pregnancy, antepartum 12/01/2015  . Dichorionic diamniotic twin pregnancy, antepartum 12/01/2015     OB History    Gravida Para Term Preterm AB Living   7 3 2 1 3 3    SAB TAB Ectopic Multiple Live Births   1 2 0 0 3     Past Medical History:  Diagnosis Date  . Anemia   . Infection    UTI  . Ovarian cyst   . Preterm labor    Past Surgical History:  Procedure Laterality Date  . CESAREAN SECTION     . INDUCED ABORTION     Family History: family history includes Asthma in her brother and daughter; Cancer in her paternal grandmother. Social History:  reports that she has never smoked. She has never used smokeless tobacco. She reports that she does not drink alcohol or use drugs.     Maternal Diabetes: No Genetic Screening: Normal Maternal Ultrasounds/Referrals: Abnormal:  Findings:   Other: Fetal Ultrasounds or other Referrals:  Referred to Materal Fetal Medicine  Maternal Substance Abuse:  No Significant Maternal Medications:  None Significant Maternal Lab Results:  Lab values include: Other:  Other Comments:  Preterm delivery. BMZ 10/12, 10/13. 20% disordance on last US, but normal growth overall for both babies.   Review of Systems  Constitutional: Negative for chills and fever.  Eyes: Negative for blurred vision.  Gastrointestinal: Positive for abdominal pain.  Genitourinary:       Neg for LOF or VB  Neurological: Negative for headaches.   Maternal Medical History:  Reason for admission: Contractions.   Contractions: Onset was 6-12 hours ago.   Frequency: regular.   Perceived severity is strong.    Fetal activity: Perceived fetal activity is normal.   Last perceived fetal movement was within the past hour.    Prenatal Complications - Diabetes: none.   Informal US shows femur of presenting fetus in pelvis Dilation: 5 Effacement (%): 100 Station: -2 Exam by:: [place] Copeland Neisen CNM Blood pressure 118/69, pulse 97, temperature 97.9 F (36.6 C), temperature source Oral, resp. rate 18, height 5\' 3"  (1.6 m), weight 217 lb 6.4 oz (98.6 kg), last menstrual period 10/16/2015, unknown if currently breastfeeding. Maternal Exam:  Uterine Assessment: Contraction strength is firm.  Contraction frequency is regular.   Abdomen: Patient reports no abdominal tenderness. Surgical scars: low transverse.   Estimated fetal weight is 50%tile/20%tile on last [place] at 30 weeks.   Fetal  presentation: breech  Introitus: Normal vulva. Normal vagina.  Cervix: Cervix evaluated by digital exam.     Fetal Exam Fetal Monitor Review: Mode: ultrasound.   Baseline rate: 145/140.  Variability: moderate (6-25 bpm).   Pattern: no decelerations and accelerations present.    Fetal State Assessment: Category I - tracings are normal.     Physical Exam  Nursing note and vitals reviewed. Constitutional: She is oriented to person, place, and time. She appears well-developed and well-nourished. She appears distressed.  HENT:  Head: Atraumatic.  Eyes: Conjunctivae are normal.  Cardiovascular: Normal rate and regular rhythm.   Respiratory: Effort normal.  GI: Soft. There is no tenderness.  Genitourinary: Vagina normal.  Musculoskeletal: She exhibits no edema.  Neurological: She is alert and oriented to person, place, and time.  Skin: Skin is warm and dry.  Psychiatric: She has a normal mood and affect.    Prenatal labs: ABO, Rh: --/--/O POS (10/12 2150) Antibody: NEG (10/12 2150) Rubella: 11.30 (05/23 1402) RPR: NON REAC (09/21 1054)  HBsAg: NEGATIVE (05/23 1402)  HIV: NONREACTIVE (09/21 1054)  GBS: Negative (05/23 0000)   Assessment: 1. Labor: Active 2. Fetal Wellbeing: Category I  3. Pain Control: None 4. GBS: Unknown 5. 34.4 week IUP  Plan:  1. Prep for OR 2. Routine Pre-op ordered  [place] 06/14/2016, 10:41 PM

## 2016-06-14 NOTE — MAU Provider Note (Signed)
Chief Complaint:  Labor Eval   First Provider Initiated Contact with Patient 06/14/16 2024     HPI: Maria Olson is a 25 y.o. [id number] at 3952w4d who presents to maternity admissions reporting worsening contractions. Was 3/60/-3 this morning. Denies LOF, VB. Pos FM.   Pregnancy Course: Di/di twins 20% growth discordance. Previous C/S x 1. Plan repeat C/S for breech twin A.   Past Medical History:  Diagnosis Date  . Anemia   . Infection    UTI  . Ovarian cyst   . Preterm labor    OB History  Gravida Para Term Preterm AB Living  7 3 2 1 3 3   SAB TAB Ectopic Multiple Live Births  1 2 0 0 3    # Outcome Date GA Lbr Len/2nd Weight Sex Delivery Anes PTL Lv  7 Current           6 SAB 07/28/15          5 Term 03/02/14 7439w4d 18:50 / 00:48 6 lb 12.3 oz (3.07 kg) F VBAC EPI  LIV  4 TAB 2014          3 TAB 2014          2 Preterm 03/27/12 6339w0d   M CS-LTranv Spinal Y LIV     Birth Comments: gastroschisis  1 Term 04/23/11 2469w2d 10:26 / 01:14 7 lb 2.8 oz (3.255 kg) F Vag-Spont EPI  LIV     Birth Comments: None     Past Surgical History:  Procedure Laterality Date  . CESAREAN SECTION    . INDUCED ABORTION     Family History  Problem Relation Age of Onset  . Asthma Brother   . Cancer Paternal Grandmother   . Asthma Daughter    Social History  Substance Use Topics  . Smoking status: Never Smoker  . Smokeless tobacco: Never Used  . Alcohol use No   No Known Allergies Prescriptions Prior to Admission  Medication Sig Dispense Refill Last Dose  . acetaminophen (TYLENOL) 500 MG tablet Take 1,000 mg by mouth every 6 (six) hours as needed for mild pain, moderate pain or headache.    Past Month at Unknown time  . NIFEdipine (PROCARDIA-XL/ADALAT CC) 30 MG 24 hr tablet Take 1 tablet (30 mg total) by mouth 2 (two) times daily. 60 tablet 3 06/14/2016 at Unknown time  . Prenatal Vit-Fe Fumarate-FA (PRENATAL MULTIVITAMIN) TABS tablet Take 1 tablet by mouth daily.    Past Week at Unknown  time  . ranitidine (ZANTAC) 150 MG tablet Take 1 tablet (150 mg total) by mouth 2 (two) times daily. 60 tablet 1 06/13/2016 at Unknown time  . ondansetron (ZOFRAN ODT) 4 MG disintegrating tablet Take 1 tablet (4 mg total) by mouth every 6 (six) hours as needed for nausea. (Patient not taking: Reported on 06/14/2016) 30 tablet 2 Not Taking at Unknown time  . pseudoephedrine (SUDAFED) 60 MG tablet Take 1 tablet (60 mg total) by mouth every 4 (four) hours as needed for congestion. (Patient not taking: Reported on 06/14/2016) 30 tablet 0 Not Taking at Unknown time    I have reviewed patient's Past Medical Hx, Surgical Hx, Family Hx, Social Hx, medications and allergies.   ROS:  Review of Systems  Constitutional: Negative for chills and fever.  Gastrointestinal: Positive for abdominal pain.  Genitourinary: Negative for vaginal bleeding and vaginal discharge.    Physical Exam  Patient Vitals for the past 24 hrs:  BP Temp Temp src Pulse Resp  Height Weight  06/14/16 1957 118/69 97.9 F (36.6 C) Oral 97 18 - -  06/14/16 1940 - - - - - 5\' 3"  (1.6 m) 217 lb 6.4 oz (98.6 kg)   Constitutional: Well-developed, well-nourished female in moderate distress.  Cardiovascular: normal rate Respiratory: normal effort GI: Abd soft, non-tender, gravid appropriate for gestational age.  MS: Extremities nontender, no edema, normal ROM Neurologic: Alert and oriented x 4.  GU: Neg CVAT.  Pelvic: NEFG, physiologic discharge, no blood.  Dilation: 4 Effacement (%): 90 Cervical Position: Anterior Station: -3 Presentation: Single Footling Breech Exam by:: Dorathy Kinsman, CNM  FHT:   Baby A Baseline 140 , moderate variability, 15x15 accelerations present, no decelerations Baby B Baseline 145 , moderate variability, 15x15 accelerations present, no decelerations Contractions: q 2-5 mins, moderate   Labs: Results for orders placed or performed during the hospital encounter of 06/14/16 (from the past 24 hour(s))   Urinalysis, Routine w reflex microscopic (not at [facility])     Status: Abnormal   Collection Time: 06/14/16  7:40 PM  Result Value Ref Range   Color, Urine YELLOW YELLOW   APPearance CLEAR CLEAR   Specific Gravity, Urine 1.020 1.005 - 1.030   pH 6.5 5.0 - 8.0   Glucose, UA NEGATIVE NEGATIVE mg/dL   Hgb urine dipstick TRACE (A) NEGATIVE   Bilirubin Urine NEGATIVE NEGATIVE   Ketones, ur NEGATIVE NEGATIVE mg/dL   Protein, ur NEGATIVE NEGATIVE mg/dL   Nitrite NEGATIVE NEGATIVE   Leukocytes, UA LARGE (A) NEGATIVE  Urine microscopic-add on     Status: Abnormal   Collection Time: 06/14/16  7:40 PM  Result Value Ref Range   Squamous Epithelial / LPF 0-5 (A) NONE SEEN   WBC, UA 6-30 0 - 5 WBC/hpf   RBC / HPF 0-5 0 - 5 RBC/hpf   Bacteria, UA FEW (A) NONE SEEN    Imaging:    MAU Course: Orders Placed This Encounter  Procedures  . Culture, OB Urine  . Urinalysis, Routine w reflex microscopic (not at [facility])  . Urine microscopic-add on  . Diet NPO time specified  . Insert peripheral IV   Meds ordered this encounter  Medications  . lactated ringers bolus 1,000 mL   Cervix changed to 5/100/-2. Pt much more uncomfortable. Notified Dr. Jolayne Panther of cervical change, Baby A still breech. Prep OR.    MDM: Active preterm labor, breech twin A. Need to proceed w/ C/S.   Assessment: Active preterm labor Breech twin A  Plan: Prep OR  [place], CNM 06/14/2016 8:38 PM

## 2016-06-14 NOTE — Discharge Instructions (Signed)
It is important to drink plenty of water during this time in your pregnancy! Please drink at least 6-8 16oz bottles of water a day. This will help reduce contractions and make you feel better overall!   Third Trimester of Pregnancy The third trimester is from week 29 through week 42, months 7 through 9. This trimester is when your unborn baby (fetus) is growing very fast. At the end of the ninth month, the unborn baby is about 20 inches in length. It weighs about 6-10 pounds.  HOME CARE   Avoid all smoking, herbs, and alcohol. Avoid drugs not approved by your doctor.  Do not use any tobacco products, including cigarettes, chewing tobacco, and electronic cigarettes. If you need help quitting, ask your doctor. You may get counseling or other support to help you quit.  Only take medicine as told by your doctor. Some medicines are safe and some are not during pregnancy.  Exercise only as told by your doctor. Stop exercising if you start having cramps.  Eat regular, healthy meals.  Wear a good support bra if your breasts are tender.  Do not use hot tubs, steam rooms, or saunas.  Wear your seat belt when driving.  Avoid raw meat, uncooked cheese, and liter boxes and soil used by cats.  Take your prenatal vitamins.  Take 1500-2000 milligrams of calcium daily starting at the 20th week of pregnancy until you deliver your baby.  Try taking medicine that helps you poop (stool softener) as needed, and if your doctor approves. Eat more fiber by eating fresh fruit, vegetables, and whole grains. Drink enough fluids to keep your pee (urine) clear or pale yellow.  Take warm water baths (sitz baths) to soothe pain or discomfort caused by hemorrhoids. Use hemorrhoid cream if your doctor approves.  If you have puffy, bulging veins (varicose veins), wear support hose. Raise (elevate) your feet for 15 minutes, 3-4 times a day. Limit salt in your diet.  Avoid heavy lifting, wear low heels, and sit up  straight.  Rest with your legs raised if you have leg cramps or low back pain.  Visit your dentist if you have not gone during your pregnancy. Use a soft toothbrush to brush your teeth. Be gentle when you floss.  You can have sex (intercourse) unless your doctor tells you not to.  Do not travel far distances unless you must. Only do so with your doctor's approval.  Take prenatal classes.  Practice driving to the hospital.  Pack your hospital bag.  Prepare the baby's room.  Go to your doctor visits. GET HELP IF:  You are not sure if you are in labor or if your water has broken.  You are dizzy.  You have mild cramps or pressure in your lower belly (abdominal).  You have a nagging pain in your belly area.  You continue to feel sick to your stomach (nauseous), throw up (vomit), or have watery poop (diarrhea).  You have bad smelling fluid coming from your vagina.  You have pain with peeing (urination). GET HELP RIGHT AWAY IF:   You have a fever.  You are leaking fluid from your vagina.  You are spotting or bleeding from your vagina.  You have severe belly cramping or pain.  You lose or gain weight rapidly.  You have trouble catching your breath and have chest pain.  You notice sudden or extreme puffiness (swelling) of your face, hands, ankles, feet, or legs.  You have not felt the baby move  in over an hour.  You have severe headaches that do not go away with medicine.  You have vision changes.   This information is not intended to replace advice given to you by your health care provider. Make sure you discuss any questions you have with your health care provider.   Document Released: 10/30/2009 Document Revised: 08/26/2014 Document Reviewed: 10/06/2012 Elsevier Interactive Patient Education [employer]2016 [employer]

## 2016-06-14 NOTE — Anesthesia Preprocedure Evaluation (Addendum)
Anesthesia Evaluation  Patient identified by MRN, date of birth, ID band Patient awake    Reviewed: Allergy & Precautions, NPO status , Patient's Chart, lab work & pertinent test results  History of Anesthesia Complications Negative for: history of anesthetic complications  Airway Mallampati: III  TM Distance: >3 FB Neck ROM: Full    Dental  (+) Dental Advisory Given   Pulmonary neg pulmonary ROS,    breath sounds clear to auscultation       Cardiovascular negative cardio ROS   Rhythm:Regular Rate:Normal     Neuro/Psych negative neurological ROS     GI/Hepatic Neg liver ROS, GERD  Medicated and Controlled,  Endo/Other  Morbid obesity  Renal/GU negative Renal ROS     Musculoskeletal   Abdominal (+) + obese,   Peds  Hematology Hb 9.9, plt 229k   Anesthesia Other Findings   Reproductive/Obstetrics (+) Pregnancy (twins)                            Anesthesia Physical Anesthesia Plan  ASA: II  Anesthesia Plan: Spinal   Post-op Pain Management:    Induction:   Airway Management Planned: Natural Airway and Nasal Cannula  Additional Equipment:   Intra-op Plan:   Post-operative Plan:   Informed Consent: I have reviewed the patients History and Physical, chart, labs and discussed the procedure including the risks, benefits and alternatives for the proposed anesthesia with the patient or authorized representative who has indicated his/her understanding and acceptance.   Dental advisory given  Plan Discussed with: CRNA and Surgeon  Anesthesia Plan Comments: (Plan routine monitors, SAB)        Anesthesia Quick Evaluation

## 2016-06-15 ENCOUNTER — Encounter (HOSPITAL_COMMUNITY): Payer: Self-pay | Admitting: Obstetrics and Gynecology

## 2016-06-15 DIAGNOSIS — O30043 Twin pregnancy, dichorionic/diamniotic, third trimester: Secondary | ICD-10-CM

## 2016-06-15 DIAGNOSIS — Z3A34 34 weeks gestation of pregnancy: Secondary | ICD-10-CM

## 2016-06-15 DIAGNOSIS — Z302 Encounter for sterilization: Secondary | ICD-10-CM

## 2016-06-15 DIAGNOSIS — O321XX2 Maternal care for breech presentation, fetus 2: Secondary | ICD-10-CM

## 2016-06-15 DIAGNOSIS — Z98891 History of uterine scar from previous surgery: Secondary | ICD-10-CM

## 2016-06-15 DIAGNOSIS — O321XX1 Maternal care for breech presentation, fetus 1: Secondary | ICD-10-CM

## 2016-06-15 DIAGNOSIS — O34211 Maternal care for low transverse scar from previous cesarean delivery: Secondary | ICD-10-CM

## 2016-06-15 LAB — CULTURE, OB URINE: SPECIAL REQUESTS: NORMAL

## 2016-06-15 LAB — CBC
HEMATOCRIT: 26 % — AB (ref 36.0–46.0)
Hemoglobin: 8.8 g/dL — ABNORMAL LOW (ref 12.0–15.0)
MCH: 30 pg (ref 26.0–34.0)
MCHC: 33.8 g/dL (ref 30.0–36.0)
MCV: 88.7 fL (ref 78.0–100.0)
PLATELETS: 246 10*3/uL (ref 150–400)
RBC: 2.93 MIL/uL — ABNORMAL LOW (ref 3.87–5.11)
RDW: 12.8 % (ref 11.5–15.5)
WBC: 10 10*3/uL (ref 4.0–10.5)

## 2016-06-15 LAB — RPR: RPR Ser Ql: NONREACTIVE

## 2016-06-15 MED ORDER — TETANUS-DIPHTH-ACELL PERTUSSIS 5-2.5-18.5 LF-MCG/0.5 IM SUSP: 0.5000 mL | Freq: Once | INTRAMUSCULAR | Status: DC

## 2016-06-15 MED ORDER — SIMETHICONE 80 MG PO CHEW
80.0000 mg | CHEWABLE_TABLET | ORAL | Status: DC
  Administered 2016-06-15: 80 mg via ORAL
  Filled 2016-06-15: qty 1

## 2016-06-15 MED ORDER — OXYCODONE HCL 5 MG PO TABS
10.0000 mg | ORAL_TABLET | ORAL | Status: DC | PRN
  Administered 2016-06-15 – 2016-06-16 (×4): 10 mg via ORAL
  Filled 2016-06-15 (×4): qty 2

## 2016-06-15 MED ORDER — DIPHENHYDRAMINE HCL 25 MG PO CAPS
25.0000 mg | ORAL_CAPSULE | Freq: Four times a day (QID) | ORAL | Status: DC | PRN
  Filled 2016-06-15: qty 1

## 2016-06-15 MED ORDER — ACETAMINOPHEN 325 MG PO TABS: 650.0000 mg | ORAL_TABLET | ORAL | Status: DC | PRN

## 2016-06-15 MED ORDER — DIBUCAINE 1 % RE OINT: 1.0000 "application " | TOPICAL_OINTMENT | RECTAL | Status: DC | PRN

## 2016-06-15 MED ORDER — NALBUPHINE HCL 10 MG/ML IJ SOLN
5.0000 mg | Freq: Once | INTRAMUSCULAR | Status: AC | PRN
  Administered 2016-06-15: 5 mg via INTRAVENOUS

## 2016-06-15 MED ORDER — DIPHENHYDRAMINE HCL 25 MG PO CAPS
25.0000 mg | ORAL_CAPSULE | ORAL | Status: DC | PRN
  Administered 2016-06-15 (×2): 25 mg via ORAL
  Filled 2016-06-15 (×2): qty 1

## 2016-06-15 MED ORDER — MORPHINE SULFATE (PF) 4 MG/ML IV SOLN: 1.0000 mg | INTRAVENOUS | Status: DC | PRN

## 2016-06-15 MED ORDER — IBUPROFEN 600 MG PO TABS
600.0000 mg | ORAL_TABLET | Freq: Four times a day (QID) | ORAL | Status: DC
  Administered 2016-06-15 – 2016-06-16 (×5): 600 mg via ORAL
  Filled 2016-06-15 (×6): qty 1

## 2016-06-15 MED ORDER — KETOROLAC TROMETHAMINE 30 MG/ML IJ SOLN
INTRAMUSCULAR | Status: AC
  Filled 2016-06-15: qty 1

## 2016-06-15 MED ORDER — PROMETHAZINE HCL 25 MG/ML IJ SOLN: 6.2500 mg | INTRAMUSCULAR | Status: DC | PRN

## 2016-06-15 MED ORDER — SIMETHICONE 80 MG PO CHEW: 80.0000 mg | CHEWABLE_TABLET | ORAL | Status: DC | PRN

## 2016-06-15 MED ORDER — NALBUPHINE HCL 10 MG/ML IJ SOLN: 5.0000 mg | Freq: Once | INTRAMUSCULAR | Status: AC | PRN

## 2016-06-15 MED ORDER — WITCH HAZEL-GLYCERIN EX PADS: 1.0000 "application " | MEDICATED_PAD | CUTANEOUS | Status: DC | PRN

## 2016-06-15 MED ORDER — NALBUPHINE HCL 10 MG/ML IJ SOLN
INTRAMUSCULAR | Status: AC
  Filled 2016-06-15: qty 1

## 2016-06-15 MED ORDER — DIPHENHYDRAMINE HCL 50 MG/ML IJ SOLN
12.5000 mg | INTRAMUSCULAR | Status: DC | PRN
  Administered 2016-06-15: 12.5 mg via INTRAVENOUS
  Filled 2016-06-15: qty 1

## 2016-06-15 MED ORDER — SODIUM CHLORIDE 0.9% FLUSH
3.0000 mL | INTRAVENOUS | Status: DC | PRN
  Administered 2016-06-15: 3 mL via INTRAVENOUS
  Filled 2016-06-15: qty 3

## 2016-06-15 MED ORDER — NALBUPHINE HCL 10 MG/ML IJ SOLN: 5.0000 mg | INTRAMUSCULAR | Status: DC | PRN

## 2016-06-15 MED ORDER — NALOXONE HCL 0.4 MG/ML IJ SOLN: 0.4000 mg | INTRAMUSCULAR | Status: DC | PRN

## 2016-06-15 MED ORDER — KETOROLAC TROMETHAMINE 30 MG/ML IJ SOLN: 30.0000 mg | Freq: Four times a day (QID) | INTRAMUSCULAR | Status: AC | PRN

## 2016-06-15 MED ORDER — ZOLPIDEM TARTRATE 5 MG PO TABS: 5.0000 mg | ORAL_TABLET | Freq: Every evening | ORAL | Status: DC | PRN

## 2016-06-15 MED ORDER — PHENYLEPHRINE 8 MG IN D5W 100 ML (0.08MG/ML) PREMIX OPTIME
INJECTION | INTRAVENOUS | Status: AC
  Filled 2016-06-15: qty 100

## 2016-06-15 MED ORDER — MIDAZOLAM HCL 2 MG/2ML IJ SOLN: 0.5000 mg | Freq: Once | INTRAMUSCULAR | Status: DC | PRN

## 2016-06-15 MED ORDER — DIPHENHYDRAMINE HCL 50 MG/ML IJ SOLN
INTRAMUSCULAR | Status: AC
  Filled 2016-06-15: qty 1

## 2016-06-15 MED ORDER — LACTATED RINGERS IV SOLN
INTRAVENOUS | Status: DC
  Administered 2016-06-15 (×2): via INTRAVENOUS

## 2016-06-15 MED ORDER — MEPERIDINE HCL 25 MG/ML IJ SOLN: 6.2500 mg | INTRAMUSCULAR | Status: DC | PRN

## 2016-06-15 MED ORDER — COCONUT OIL OIL: 1.0000 "application " | TOPICAL_OIL | Status: DC | PRN

## 2016-06-15 MED ORDER — OXYTOCIN 40 UNITS IN LACTATED RINGERS INFUSION - SIMPLE MED
2.5000 [IU]/h | INTRAVENOUS | Status: AC
Start: 2016-06-15 — End: 2016-06-15

## 2016-06-15 MED ORDER — PRENATAL MULTIVITAMIN CH
1.0000 | ORAL_TABLET | Freq: Every day | ORAL | Status: DC
  Administered 2016-06-15: 1 via ORAL
  Filled 2016-06-15: qty 1

## 2016-06-15 MED ORDER — SIMETHICONE 80 MG PO CHEW
80.0000 mg | CHEWABLE_TABLET | Freq: Three times a day (TID) | ORAL | Status: DC
  Administered 2016-06-15 – 2016-06-16 (×4): 80 mg via ORAL
  Filled 2016-06-15 (×4): qty 1

## 2016-06-15 MED ORDER — MENTHOL 3 MG MT LOZG: 1.0000 | LOZENGE | OROMUCOSAL | Status: DC | PRN

## 2016-06-15 MED ORDER — OXYCODONE HCL 5 MG PO TABS: 5.0000 mg | ORAL_TABLET | ORAL | Status: DC | PRN

## 2016-06-15 MED ORDER — ONDANSETRON HCL 4 MG/2ML IJ SOLN: 4.0000 mg | Freq: Three times a day (TID) | INTRAMUSCULAR | Status: DC | PRN

## 2016-06-15 MED ORDER — SENNOSIDES-DOCUSATE SODIUM 8.6-50 MG PO TABS
2.0000 | ORAL_TABLET | ORAL | Status: DC
  Administered 2016-06-15: 2 via ORAL
  Filled 2016-06-15: qty 2

## 2016-06-15 MED ORDER — CEFAZOLIN SODIUM-DEXTROSE 2-4 GM/100ML-% IV SOLN
INTRAVENOUS | Status: AC
  Filled 2016-06-15: qty 100

## 2016-06-15 MED ORDER — SCOPOLAMINE 1 MG/3DAYS TD PT72: 1.0000 | MEDICATED_PATCH | Freq: Once | TRANSDERMAL | Status: DC

## 2016-06-15 MED ORDER — DIPHENHYDRAMINE HCL 50 MG/ML IJ SOLN
INTRAMUSCULAR | Status: DC | PRN
  Administered 2016-06-15 (×2): 25 mg via INTRAVENOUS

## 2016-06-15 MED ORDER — NALOXONE HCL 2 MG/2ML IJ SOSY
1.0000 ug/kg/h | PREFILLED_SYRINGE | INTRAMUSCULAR | Status: DC | PRN
Start: 2016-06-15 — End: 2016-06-16
  Filled 2016-06-15: qty 2

## 2016-06-15 NOTE — Transfer of Care (Signed)
Immediate Anesthesia Transfer of Care Note  Patient: Maria Olson  Procedure(s) Performed: Procedure(s): CESAREAN SECTION (N/A)  Patient Location: PACU  Anesthesia Type:Spinal  Level of Consciousness: awake, alert , oriented and patient cooperative  Airway & Oxygen Therapy: Patient Spontanous Breathing  Post-op Assessment: Report given to RN and Post -op Vital signs reviewed and stable  Post vital signs: Reviewed and stable  Last Vitals:  Vitals:   06/14/16 1957 06/14/16 2243  BP: 118/69 125/75  Pulse: 97 91  Resp: 18   Temp: 36.6 C     Last Pain:  Vitals:   06/14/16 1959  TempSrc:   PainSc: 10-Worst pain ever         Complications: No apparent anesthesia complications

## 2016-06-15 NOTE — Op Note (Signed)
Maria DeanerKierra D Olson PROCEDURE DATE: 06/14/2016  PREOPERATIVE DIAGNOSIS: Intrauterine pregnancy at  3421w5d weeks gestation; malpresentation: breech/breech and previous uterine incision kerr x 1 and desire for permanent sterilization  POSTOPERATIVE DIAGNOSIS: The same  PROCEDURE:     Cesarean Section and Bilateral Tubal Sterilization using Pomeroy method  SURGEON:  Dr. Catalina AntiguaPeggy Robena Ewy  ASSISTANT: Dr. Roxanne MinsMumuaw  INDICATIONS: Maria Olson is a 25 y.o. [id number] at 2621w5d scheduled for cesarean section secondary to malpresentation: breech/breech in labor at 5 cm.  The risks of cesarean section discussed with the patient included but were not limited to: bleeding which may require transfusion or reoperation; infection which may require antibiotics; injury to bowel, bladder, ureters or other surrounding organs; injury to the fetus; need for additional procedures including hysterectomy in the event of a life-threatening hemorrhage; placental abnormalities wth subsequent pregnancies, incisional problems, thromboembolic phenomenon and other postoperative/anesthesia complications. The patient also desires permanent sterilization. Risks and benefits of procedure discussed with patient including permanence of method, bleeding, infection, injury to surrounding organs and need for additional procedures. Risk failure of 0.5-1% with increased risk of ectopic gestation if pregnancy occurs was also discussed with patient. The patient concurred with the proposed plan, giving informed written consent for the procedure.    FINDINGS:  Viable female infants x 2 in breech/breech presentation.  Baby A: Apgars 8 and 7  Baby B: Apgars 9 and 10.  Clear amniotic fluid x 2.  Intact placenta, three vessel cord x 2.  Normal uterus, fallopian tubes and ovaries bilaterally.  ANESTHESIA:    Spinal INTRAVENOUS FLUIDS:2000 ml ESTIMATED BLOOD LOSS: 1000 ml URINE OUTPUT:  200 ml SPECIMENS: Placenta and portions of left and right fallopian  tubes were sent to pathology COMPLICATIONS: None immediate  PROCEDURE IN DETAIL:  The patient received intravenous antibiotics and had sequential compression devices applied to her lower extremities while in the preoperative area.  She was then taken to the operating room where anesthesia was induced and was found to be adequate. A foley catheter was placed into her bladder and attached to Isaih Bulger gravity. She was then placed in a dorsal supine position with a leftward tilt, and prepped and draped in a sterile manner. After an adequate timeout was performed, a Pfannenstiel skin incision was made with scalpel and carried through to the underlying layer of fascia. The fascia was incised in the midline and this incision was extended bilaterally using the Mayo scissors. Kocher clamps were applied to the superior aspect of the fascial incision and the underlying rectus muscles were dissected off bluntly. A similar process was carried out on the inferior aspect of the facial incision. The rectus muscles were separated in the midline bluntly and the peritoneum was entered bluntly. The Alexis self-retaining retractor was introduced into the abdominal cavity. Attention was turned to the lower uterine segment where a transverse hysterotomy was made with a scalpel and extended bilaterally bluntly. The infant was successfully delivered, and cord was clamped and cut and infant was handed over to awaiting neonatology team. Uterine massage was then administered and the placenta delivered intact with three-vessel cord. The uterus was cleared of clot and debris.  The hysterotomy was closed with 0 Vicryl in a running locked fashion, and an imbricating layer was also placed with a 0 Vicryl. Overall, excellent hemostasis was noted. The pelvis copiously irrigated and cleared of all clot and debris. The patient's left fallopian tube was then identified, brought to the incision, and grasped with a Babcock clamp. The tube was  then  followed out to the fimbria. The Babcock clamp was then used to grasp the tube approximately 4 cm from the cornual region. A 3 cm segment of the tube was then ligated with free tie of plain gut suture, transected and excised. Good hemostasis was noted and the tube was returned to the abdomen. The right fallopian tube was then identified to its fimbriated end, ligated, and a 3 cm segment excised in a similar fashion. Excellent hemostasis was noted, and the tube returned to the abdomen.  Hemostasis was confirmed on all surfaces.  The peritoneum and the muscles were reapproximated using 0 vicryl interrupted stitches. The fascia was then closed using 0 Vicryl in a running fashion.  The skin was closed in a subcuticular fashion using 3.0 Vicryl. The patient tolerated the procedure well. Sponge, lap, instrument and needle counts were correct x 2. She was taken to the recovery room in stable condition.    Willmar Stockinger,PEGGYMD  06/15/2016 12:22 AM

## 2016-06-15 NOTE — Addendum Note (Signed)
Addendum  created 06/15/16 [id number] by Rica RecordsAngela Atalaya Zappia, CRNA   Sign clinical note

## 2016-06-15 NOTE — Progress Notes (Signed)
Subjective:pumping breastmilk Postpartum Day 1: Cesarean Delivery Patient reports incisional pain and tolerating PO.    Objective: Vital signs in last 24 hours: Temp:  [97.5 F (36.4 C)-98.8 F (37.1 C)] 98.6 F (37 C) (10/28 0600) Pulse Rate:  [66-97] 78 (10/28 0800) Resp:  [16-22] 16 (10/28 0800) BP: (99-125)/(48-75) 119/65 (10/28 0800) SpO2:  [97 %-100 %] 100 % (10/28 0800) Weight:  [98.6 kg (217 lb 6.4 oz)] 98.6 kg (217 lb 6.4 oz) (10/27 1940)  Physical Exam:  General: alert, cooperative and no distress Lochia: appropriate Uterine Fundus: firm Incision: dressing intact DVT Evaluation: No evidence of DVT seen on physical exam.   Recent Labs  06/14/16 2214 06/15/16 0752  HGB 9.9* 8.8*  HCT 28.3* 26.0*    Assessment/Plan: Status post Cesarean section. Doing well postoperatively.  Continue current care.  Coda Mathey 06/15/2016, 9:58 AM

## 2016-06-15 NOTE — Anesthesia Postprocedure Evaluation (Signed)
Anesthesia Post Note  Patient: Herbert DeanerKierra D Kaelin  Procedure(s) Performed: Procedure(s) (LRB): CESAREAN SECTION (N/A)  Patient location during evaluation: [facility] Anesthesia Type: Spinal Level of consciousness: oriented and awake and alert Pain management: pain level controlled Vital Signs Assessment: post-procedure vital signs reviewed and stable Respiratory status: spontaneous breathing, respiratory function stable and patient connected to nasal cannula oxygen Cardiovascular status: blood pressure returned to baseline and stable Postop Assessment: no headache, no backache and patient able to bend at knees Anesthetic complications: no     Last Vitals:  Vitals:   06/15/16 0700 06/15/16 0800  BP: (!) 105/48 119/65  Pulse: 82 78  Resp: 18 16  Temp:      Last Pain:  Vitals:   06/15/16 0613  TempSrc:   PainSc: 0-No pain   Pain Goal:                 Rica RecordsICKELTON,Yvana Samonte

## 2016-06-15 NOTE — Anesthesia Postprocedure Evaluation (Addendum)
Anesthesia Post Note  Patient: Maria Olson  Procedure(s) Performed: Procedure(s) (LRB): CESAREAN SECTION (N/A)  Patient location during evaluation: PACU Anesthesia Type: Spinal Level of consciousness: oriented, patient cooperative and awake and alert Pain management: pain level controlled Vital Signs Assessment: post-procedure vital signs reviewed and stable Respiratory status: spontaneous breathing, nonlabored ventilation and respiratory function stable Cardiovascular status: blood pressure returned to baseline and stable Postop Assessment: no signs of nausea or vomiting, patient able to bend at knees and spinal receding Anesthetic complications: no     Last Vitals:  Vitals:   06/15/16 0255 06/15/16 0400  BP: (!) 112/57 113/64  Pulse: 66 72  Resp: 16 18  Temp: 37.1 C 37.1 C    Last Pain:  Vitals:   06/15/16 0400  TempSrc: Oral  PainSc: 0-No pain   Pain Goal:                 Takya Vandivier,E. Akshaj Besancon

## 2016-06-15 NOTE — Progress Notes (Signed)
Another pt was started in [facility] under this pt's name because she was the previous pt was in this MAU room.  Other patient's fetal monitoring started at 0025 and dc'd at 0105.

## 2016-06-15 NOTE — Addendum Note (Signed)
Addendum  created 06/15/16 0449 by Jairo Benarswell Brigitta Pricer, MD   Sign clinical note

## 2016-06-15 NOTE — Lactation Note (Addendum)
This note was copied from a baby's chart. Lactation Consultation Note   With this experienced breast feeding mom of NICU twins, LPI, at 7834 4/[redacted] weeks gestation, now 5712 hours old. Mom set up DEP for mom, and started her pumping. Mom has a glistening of colostrum from each breast. Hand expression reviewed. Mom encouraged to hold babies skin to skin when visiting them in the NICU, and allowing them to nuzzle at the breast as tolerated. Mom encouraged to pump every 3 hours, and to visit babies prior to pumping. WIC fax sent for DEP. Mom knows to call for questions/concerns.   Patient Name: Patric DykesBoyA Lorianna Bellissimo ZOXWR'UToday's Date: 06/15/2016 Reason for consult: Initial assessment   Maternal Data Formula Feeding for Exclusion: Yes Reason for exclusion:  (babies are in NICU) Has patient been taught Hand Expression?: Yes Does the patient have breastfeeding experience prior to this delivery?: Yes  Feeding    LATCH Score/Interventions                      Lactation Tools Discussed/Used WIC Program: Yes (fax sent for DEP) Pump Review: Setup, frequency, and cleaning;Milk Storage;Other (comment) (review of NICU book, initiation pump setting, hand expression) Initiated by:: Danton Clapchristine Starasia Sinko, RN IBCLC Date initiated:: 06/15/16 (at 12 hour post partum)   Consult Status Consult Status: Follow-up Date: 06/16/16 Follow-up type: In-patient    Alfred LevinsLee, Wilfrid Hyser Anne 06/15/2016, 10:17 AM

## 2016-06-16 MED ORDER — POLYETHYLENE GLYCOL 3350 17 G PO PACK
17.0000 g | PACK | Freq: Once | ORAL | Status: AC
  Administered 2016-06-16: 17 g via ORAL
  Filled 2016-06-16: qty 1

## 2016-06-16 MED ORDER — OXYCODONE HCL 10 MG PO TABS: 10.0000 mg | ORAL_TABLET | ORAL | 0 refills | Status: DC | PRN

## 2016-06-16 MED ORDER — IBUPROFEN 600 MG PO TABS: 600.0000 mg | ORAL_TABLET | Freq: Four times a day (QID) | ORAL | 0 refills | Status: DC

## 2016-06-16 NOTE — Progress Notes (Signed)

## 2016-06-16 NOTE — Clinical Social Work Maternal (Signed)
CLINICAL SOCIAL WORKMATERNAL/CHILD NOTE  Patient Details Name: Maria Olson MRN: [id number] Date of Birth: 10/10/1990  Date: 06/16/2016  Clinical Social Worker Initiating Note: Elyzabeth Goatley, MSW, LCSW-ADate/ Time Initiated: 06/16/16/1726  Child's Name:  Legal Guardian:Other (Comment) (Not established by court system; MOB and FOB parent collectively )  Need for Interpreter:None  Date of Referral:06/15/16  Reason for Referral:Other (Comment) (New NICU admission )  Referral Source:NICU  Address:[address] [place], [place] [address] Phone number:[phone]  Household Members:Parents, Significant Other  Natural Supports (not living in the home):Extended Family, Friends, Immediate Family  Professional Supports:None  Employment:  Type of Work:  Education:9 to 11 years  Financial Resources:Medicaid  Other Resources:  Cultural/Religious Considerations Which May Impact Care:None reported at this time.   Strengths:Compliance with medical plan , Home prepared for child , Ability to meet basic needs   Risk Factors/Current Problems:None  Cognitive State:Alert , Goal Oriented , Insightful   Mood/Affect:Calm , Comfortable , Interested   CSW Assessment:CSW met with MOB at NICU bedside to complete assessment. At this time, MOB was happy and holding one of two win boys while a friend and/or relative was caring for the other twin boy. This writer explained reasoning for visit being to assist with any needs and offer support as it is needed during NICU admission. MOB notes she is feeling well thus far and has no needs at this time. She notes home is prepared and she has good supports. She is hoping twins can come home sooner rather than later. CSW is available should any other needs arise or per MOB request.   CSW Plan/Description:Psychosocial Support and  Ongoing Assessment of Needs  Donatella Walski, MSW, LCSW-A Clinical Social Worker  [facility]  Office: [phone]    CLINICAL SOCIAL WORK MATERNAL/CHILD NOTE  Patient Details  Name: Maria Olson MRN: [id number] Date of Birth: 18-May-1991  Date:  06/16/2016  Clinical Social Worker Initiating Note:  Ferdinand Lango Tyge Somers, MSW, LCSW-A Date/ Time Initiated:  06/16/16/1726     Child's Name:      Legal Guardian:  Other (Comment) (Not established by court system; MOB and FOB parent collectively )   Need for Interpreter:  None   Date of Referral:  06/15/16     Reason for Referral:  Other (Comment) (New NICU admission )   Referral Source:  NICU   Address:  [address], [place] [address]  Phone number:  [phone]   Household Members:  Parents, Significant Other   Natural Supports (not living in the home):  Extended Family, Friends, Immediate Family   Professional Supports: None   Employment:     Type of Work:     Education:  9 to 11 years   Museum/gallery curator Resources:  Medicaid   Other Resources:      Cultural/Religious Considerations Which May Impact Care:  None reported at this time.   Strengths:  Compliance with medical plan , Home prepared for child , Ability to meet basic needs    Risk Factors/Current Problems:  None   Cognitive State:  Alert , Goal Oriented , Insightful    Mood/Affect:  Calm , Comfortable , Interested    CSW Assessment: CSW met with MOB at NICU bedside to complete assessment. At this time, MOB was happy and holding one of two win boys while a friend and/or relative was caring for the other twin boy. This Probation officer explained reasoning for visit being to assist with any needs and offer support as it is needed during NICU admission. MOB notes she is feeling well thus far and has no needs at this time. She notes home is prepared and she has good supports. She is hoping twins can come home sooner rather than later. CSW is available should any other needs arise or per MOB request.   CSW Plan/Description:  Psychosocial Support and Ongoing Assessment of Needs      [place], MSW, [facility]  Office: [phone]

## 2016-06-16 NOTE — Discharge Summary (Signed)
OB Discharge Summary  Patient Name: Maria FriendlyKierra D Pandolfi DOB: 1991-07-21 MRN: [id number]  Date of admission: 06/14/2016 Delivering MD:    Patric DykesMcCants, BoyA [place]uni [[id number]][[id number]]  CONSTANT, PEGGY   [place], Jeanice LimBoyB Echo [[id number]][[id number]]  CONSTANT, PEGGY   Date of discharge: 06/16/2016  Admitting diagnosis: 34WKS CTX Intrauterine pregnancy: 214w6d     Secondary diagnosis:Active Problems:   S/P cesarean section  Additional problems:none     Discharge diagnosis: Preterm Pregnancy Delivered                                                                     Post partum procedures:none  Augmentation: n/a  Complications: None  Hospital course:  Onset of Labor With Unplanned C/S  25 y.o. yo [id number] at 4814w6d was admitted in Latent Labor on 06/14/2016. Patient had a labor course significant for breech twins. Membrane Rupture Time/Date:    Patric DykesMcCants, BoyA Adriannah [[id number]][[id number]]  11:41 PM   Leticia PennaMcCants, BoyB Laneka [[id number]][[id number]]  11:41 PM ,   Patric DykesMcCants, BoyA Aniya [[id number]][[id number]]  06/14/2016   Leticia PennaMcCants, BoyB Charlese [[id number]][[id number]]  06/14/2016   The patient went for cesarean section due to Elective Repeat and Malpresentation, and delivered a Viable infant,   Patric DykesMcCants, BoyA Azari [[id number]][[id number]]  06/14/2016   Leticia PennaMcCants, BoyB Betta [[id number]][[id number]]  06/14/2016  Details of operation can be found in separate operative note. Patient had an uncomplicated postpartum course.  She is ambulating,tolerating a regular diet, passing flatus, and urinating well.  Patient is discharged home in stable condition 06/16/16.   Physical exam Vitals:   06/15/16 1400 06/15/16 1845 06/15/16 2202 06/16/16 0535  BP: (!) 102/58 (!) 120/57 (!) 109/37 (!) 108/56  Pulse: 88 80 71 80  Resp: 18 18 17 15   Temp: 98.5 F (36.9 C) 99.2 F (37.3 C) 98.4 F (36.9 C) 98.5 F (36.9 C)  TempSrc: Oral Oral Oral Oral  SpO2: 95% 99% 98% 98%  Weight:      Height:       General: alert, cooperative and no distress Lochia: appropriate Uterine  Fundus: firm Incision: Healing well with no significant drainage, No significant erythema, Dressing is clean, dry, and intact DVT Evaluation: No evidence of DVT seen on physical exam. Labs: Lab Results  Component Value Date   WBC 10.0 06/15/2016   HGB 8.8 (L) 06/15/2016   HCT 26.0 (L) 06/15/2016   MCV 88.7 06/15/2016   PLT 246 06/15/2016   CMP Latest Ref Rng & Units 05/20/2016  Glucose 65 - 99 mg/dL 94  BUN 6 - 20 mg/dL 8  Creatinine 2.020.44 - 5.421.00 mg/dL 7.060.65  Sodium 237135 - 628145 mmol/L 134(L)  Potassium 3.5 - 5.1 mmol/L 3.6  Chloride 101 - 111 mmol/L 107  CO2 22 - 32 mmol/L 20(L)  Calcium 8.9 - 10.3 mg/dL 9.1  Total Protein 6.5 - 8.1 g/dL 6.8  Total Bilirubin 0.3 - 1.2 mg/dL 0.3  Alkaline Phos 38 - 126 U/L 124  AST 15 - 41 U/L 18  ALT 14 - 54 U/L 12(L)    Discharge instruction: per After Visit Summary and "Baby and Me Booklet".  After Visit Meds:    Medication List    STOP taking these medications   acetaminophen 500 MG tablet Commonly known as:  TYLENOL   NIFEdipine 30 MG 24 hr tablet Commonly known as:  PROCARDIA-XL/ADALAT CC   ondansetron 4 MG disintegrating tablet Commonly known as:  ZOFRAN ODT   pseudoephedrine 60 MG tablet Commonly known as:  SUDAFED     TAKE these medications   ibuprofen 600 MG tablet Commonly known as:  ADVIL,MOTRIN Take 1 tablet (600 mg total) by mouth every 6 (six) hours.   Oxycodone HCl 10 MG Tabs Take 1 tablet (10 mg total) by mouth every 4 (four) hours as needed (pain scale > 7).   prenatal multivitamin Tabs tablet Take 1 tablet by mouth daily.   ranitidine 150 MG tablet Commonly known as:  ZANTAC Take 1 tablet (150 mg total) by mouth 2 (two) times daily.       Diet: routine diet  Activity: Advance as tolerated. Pelvic rest for 6 weeks.   Outpatient follow up:6 weeks Follow up Appt:Future Appointments Date Time Provider Department Center  06/19/2016 9:20 AM Duane LopeJennifer I Rasch, NP [facility]   Follow up visit: No  Follow-up on file.  Postpartum contraception: Tubal Ligation  Newborn Data:   Patric DykesMcCants, BoyA Lean [[id number]][[id number]]  Live born female  Birth Weight: 5 lb 6.1 oz (2440 g) APGAR: 8, 7   Leticia PennaMcCants, BoyB Milli [[id number]][[id number]]  Live born female  Birth Weight: 4 lb 3.4 oz (1910 g) APGAR: 9, 10  Baby Feeding: Breast Disposition:NICU   06/16/2016 Wyvonnia DuskyMarie Sirena Riddle, CNM

## 2016-06-16 NOTE — Plan of Care (Signed)
Problem: Activity: Goal: Ability to tolerate increased activity will improve Outcome: Completed/Met Date Met: 06/16/16 Patient is ambulatory and tolerates this very well.

## 2016-06-16 NOTE — Lactation Note (Signed)
This note was copied from a baby's chart. Lactation Consultation Note  Patient Name: Maria Olson Today's Date: 06/16/2016   Follow up asessment with this mom fo NICU twins, now 7740 hours old, and 34 6/7 weeks CGA. Mom is discharged to home, [facility]. She took a hand medela manual pump home, and was instructed in it;s use and care, as well as advised to add HE with pumping, and review on how to HE was done. Mom has been latching the twins in the NICU, and knows to call for questions/concerns. She did not have the $30 cash for a [employer] loaner.   Maternal Data    Feeding Feeding Type: Formula Nipple Type: Regular Length of feed: 15 min  LATCH Score/Interventions                      Lactation Tools Discussed/Used     Consult Status Consult Status: PRN Follow-up type: In-patient (NICU)    Alfred LevinsLee, Tayden Duran Anne 06/16/2016, 4:13 PM

## 2016-06-19 ENCOUNTER — Encounter: Payer: Self-pay | Admitting: Obstetrics and Gynecology

## 2016-06-20 ENCOUNTER — Telehealth: Payer: Self-pay | Admitting: *Deleted

## 2016-06-20 ENCOUNTER — Encounter: Payer: Self-pay | Admitting: Obstetrics & Gynecology

## 2016-06-20 NOTE — Telephone Encounter (Signed)
Pt called and stated that she was having some achy joints. She does not have a fever. Incision clean and dry per patient. Advised her to use tylenol to help with pain and if it is persistent or she develops fevers or problems with her incision to call us. Patient is agreeable to this.

## 2016-07-12 ENCOUNTER — Encounter (HOSPITAL_COMMUNITY): Payer: Self-pay

## 2016-07-29 ENCOUNTER — Ambulatory Visit: Payer: Self-pay | Admitting: Student

## 2016-07-29 ENCOUNTER — Encounter: Payer: Self-pay | Admitting: *Deleted

## 2016-07-29 ENCOUNTER — Encounter: Payer: Self-pay | Admitting: Student

## 2016-07-29 NOTE — Progress Notes (Signed)
Maria Olson missed a scheduled postpartum appointment. No need to call pt, may be reschedule if calls.

## 2016-09-27 ENCOUNTER — Ambulatory Visit: Payer: Self-pay | Admitting: Family Medicine

## 2016-11-26 ENCOUNTER — Encounter (HOSPITAL_COMMUNITY): Payer: Self-pay

## 2016-11-26 ENCOUNTER — Emergency Department (HOSPITAL_COMMUNITY)
Admission: EM | Admit: 2016-11-26 | Discharge: 2016-11-26 | Disposition: A | Discharge status: Home / Self Care | Payer: Medicaid Other | Attending: Emergency Medicine | Admitting: Emergency Medicine

## 2016-11-26 DIAGNOSIS — K0889 Other specified disorders of teeth and supporting structures: Secondary | ICD-10-CM | POA: Insufficient documentation

## 2016-11-26 MED ORDER — FLUCONAZOLE 100 MG PO TABS: 100.0000 mg | ORAL_TABLET | Freq: Every day | ORAL | 0 refills | Status: AC

## 2016-11-26 MED ORDER — IBUPROFEN 800 MG PO TABS
800.0000 mg | ORAL_TABLET | Freq: Once | ORAL | Status: AC
  Administered 2016-11-26: 800 mg via ORAL
  Filled 2016-11-26: qty 1

## 2016-11-26 MED ORDER — PENICILLIN V POTASSIUM 250 MG PO TABS: 250.0000 mg | ORAL_TABLET | Freq: Four times a day (QID) | ORAL | 0 refills | Status: AC

## 2016-11-26 NOTE — Discharge Instructions (Signed)
Follow up with the dentist as discussed °

## 2016-11-26 NOTE — ED Provider Notes (Signed)
[facility] Provider Note   CSN: [id number] Arrival date & time: 11/26/16  1347  By signing my name below, I, Maria Olson, attest that this documentation has been prepared under the direction and in the presence of Teressa Lower, NP . Electronically Signed: Freida Olson, Scribe. 11/26/2016. 2:32 PM  History   Chief Complaint Chief Complaint  Patient presents with  . Dental Pain    The history is provided by the patient. No language interpreter was used.    HPI Comments:  Maria Olson is a 26 y.o. female who presents to the Emergency Department complaining of constant, right upper dental pain x 2 months. She notes associated HA with pain located to the temporal region. She has taken Tylenol without relief. Pt denies recent broken teeth and fever. She last saw the dentist ~ 1 year ago.   Past Medical History:  Diagnosis Date  . Anemia   . Infection    UTI  . Ovarian cyst   . Preterm labor     Patient Active Problem List   Diagnosis Date Noted  . Hx successful VBAC (vaginal birth after cesarean), currently pregnant 01/09/2016  . Hx of fetal anomaly in prior pregnancy, currently pregnant 01/09/2016    Past Surgical History:  Procedure Laterality Date  . CESAREAN SECTION    . CESAREAN SECTION N/A 06/14/2016   Procedure: CESAREAN SECTION;  Surgeon: Catalina Antigua, MD;  Location: [facility];  Service: Obstetrics;  Laterality: N/A;  . INDUCED ABORTION      OB History    Gravida Para Term Preterm AB Living   SAB TAB Ectopic Multiple Live Births   1 2 0 0 3       Home Medications    Prior to Admission medications   Medication Sig Start Date End Date Taking? Authorizing Provider  ibuprofen (ADVIL,MOTRIN) 600 MG tablet Take 1 tablet (600 mg total) by mouth every 6 (six) hours. 06/16/16   Montez Morita, CNM  oxyCODONE 10 MG TABS Take 1 tablet (10 mg total) by mouth every 4 (four) hours as needed (pain scale > 7). 06/16/16   Montez Morita, CNM  Prenatal Vit-Fe Fumarate-FA (PRENATAL MULTIVITAMIN) TABS tablet Take 1 tablet by mouth daily.     Historical Provider, MD  ranitidine (ZANTAC) 150 MG tablet Take 1 tablet (150 mg total) by mouth 2 (two) times daily. 05/20/16   Lorne Skeens, MD    Family History Family History  Problem Relation Age of Onset  . Asthma Brother   . Cancer Paternal Grandmother   . Asthma Daughter     Social History Social History  Substance Use Topics  . Smoking status: Never Smoker  . Smokeless tobacco: Never Used  . Alcohol use No     Allergies   Patient has no known allergies.   Review of Systems Review of Systems  Constitutional: Negative for chills and fever.  HENT: Positive for dental problem. Negative for trouble swallowing.   Respiratory: Negative for shortness of breath.   Cardiovascular: Negative for chest pain.  Neurological: Positive for headaches.  All other systems reviewed and are negative.    Physical Exam Updated Vital Signs BP (!) 137/93 (BP Location: Right Arm)   Pulse (!) 59   Temp 98.4 F (36.9 C) (Oral)   Resp 17   Ht  (1.6 m)   Wt 178 lb (80.7 kg)   LMP 11/07/2016   SpO2 100%  BMI 31.53 kg/m   Physical Exam  Constitutional: She is oriented to person, place, and time. She appears well-developed and well-nourished. No distress.  HENT:  Head: Normocephalic and atraumatic.  Right Ear: External ear normal.  Left Ear: External ear normal.  Mild swelling noted to the right upper gums. No definite decay or fracture noted  Eyes: Conjunctivae are normal.  Cardiovascular: Normal rate.   Pulmonary/Chest: Effort normal.  Abdominal: She exhibits no distension.  Neurological: She is alert and oriented to person, place, and time.  Skin: Skin is warm and dry.  Psychiatric: She has a normal mood and affect.  Nursing note and vitals reviewed.    ED Treatments / Results  DIAGNOSTIC STUDIES:  Oxygen Saturation is 100% on RA, normal by  my interpretation.    COORDINATION OF CARE:  2:30 PM Discussed treatment plan with pt at bedside and pt agreed to plan.  Labs (all labs ordered are listed, but only abnormal results are displayed) Labs Reviewed - No data to display  EKG  EKG Interpretation None       Radiology No results found.  Procedures Procedures (including critical care time)  Medications Ordered in ED Medications - No data to display   Initial Impression / Assessment and Plan / ED Course  I have reviewed the triage vital signs and the nursing notes.  Pertinent labs & imaging results that were available during my care of the patient were reviewed by me and considered in my medical decision making (see chart for details).     No sign of ludwigs. She has an appointment with the dentist. Treated with pcn  Final Clinical Impressions(s) / ED Diagnoses   Final diagnoses:  None    New Prescriptions New Prescriptions   No medications on file   I personally performed the services described in this documentation, which was scribed in my presence. The recorded information has been reviewed and is accurate.     Teressa Lower, NP 11/26/16 1446    Nira Conn, MD 11/26/16 2153

## 2016-11-26 NOTE — ED Triage Notes (Signed)
Pt presents to the ed for complaints of dental pain in right upper mouth.

## 2016-12-18 ENCOUNTER — Encounter (HOSPITAL_COMMUNITY): Payer: Self-pay | Admitting: Obstetrics and Gynecology

## 2017-02-25 IMAGING — CR DG FINGER INDEX 2+V*R*
3 series · 3 of 3 positions shown · non-contrast
Comparison: None.

CLINICAL DATA: Status post fall last night with a right index
finger injury. Pain and swelling. Initial encounter.

EXAM:
RIGHT INDEX FINGER 2+V

[x finger lat right (1 of 2)]
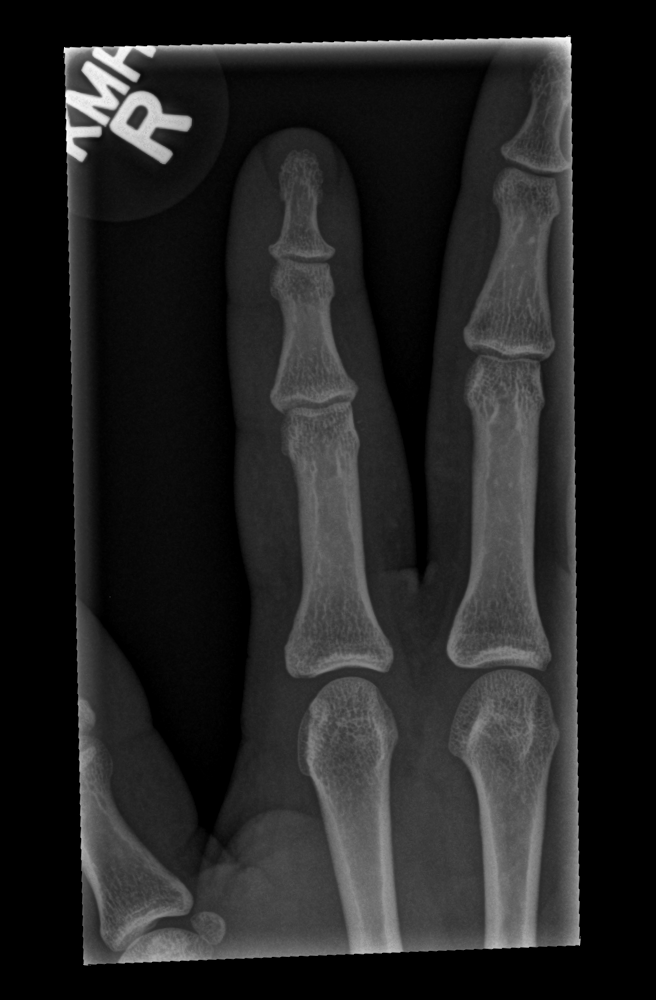

[x finger obl right]
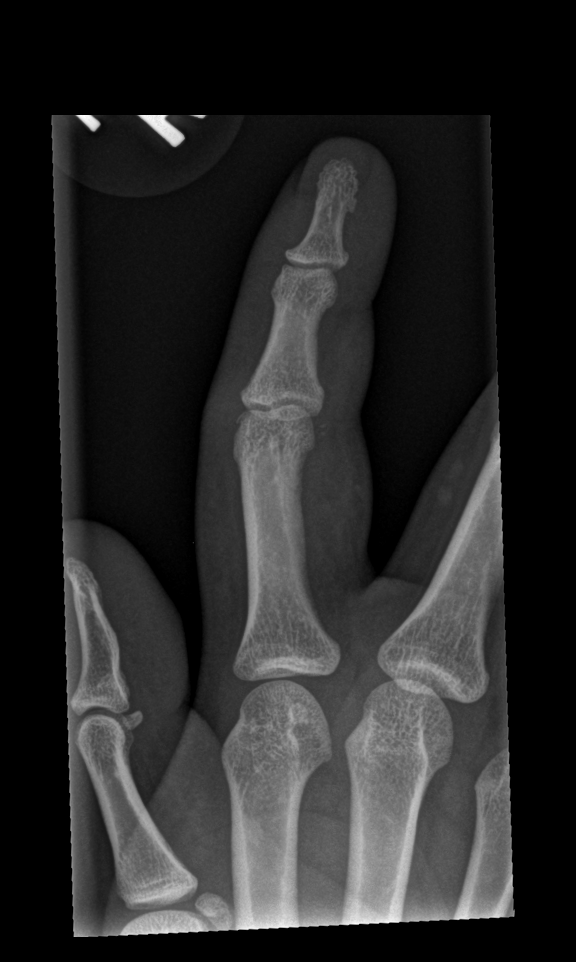

[x finger lat right (2 of 2)]
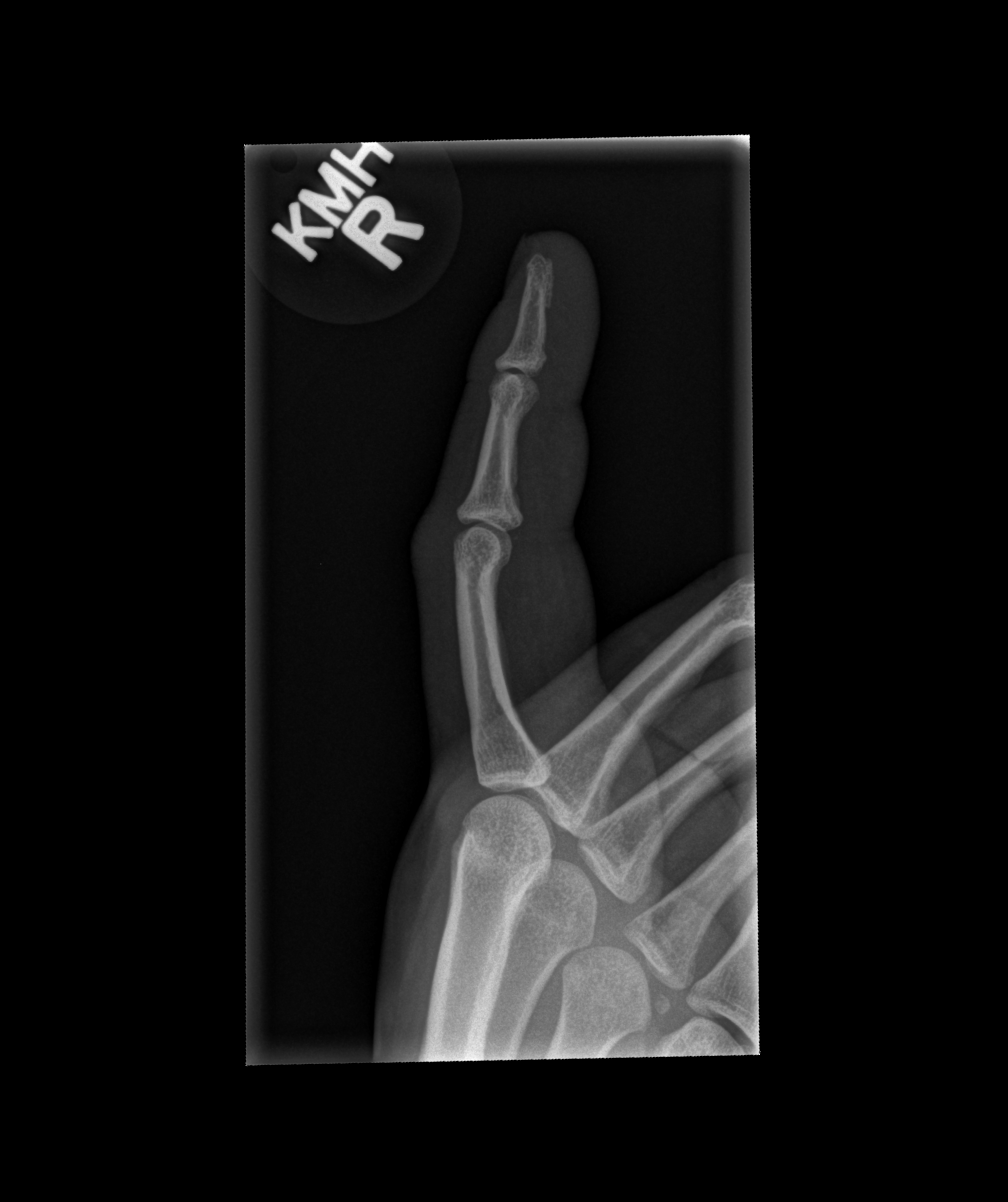

[3 of 3 positions shown; findings below may reference images not displayed]

FINDINGS: Soft tissues of the right index finger appear swollen. No bony or
joint abnormality is seen. No radiopaque foreign body is identified.
IMPRESSION: Swelling of the right index finger. Negative for bony or joint
abnormality.

## 2017-04-10 IMAGING — US US MFM OB FOLLOW-UP EACH ADDL GEST (MODIFY)
1 series · 12 of 28 positions shown · non-contrast
Comparison: none

[Series 1: us mfm ob follow-up each addl gest (modify) · 12 of 67 slices shown]
[im 3/67]
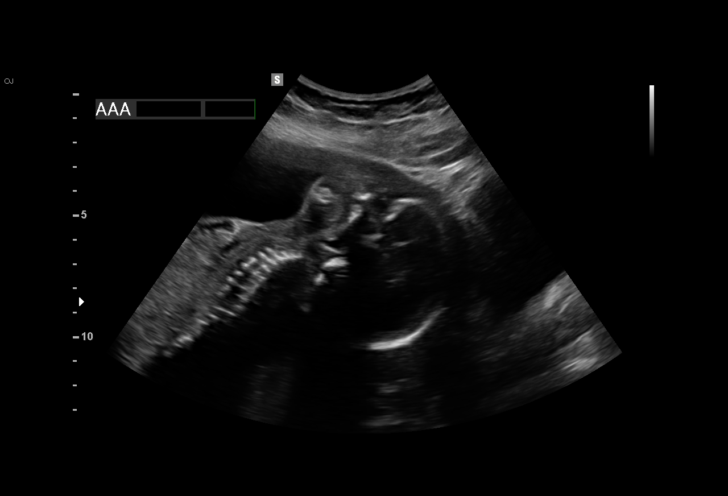
[im 8/67]
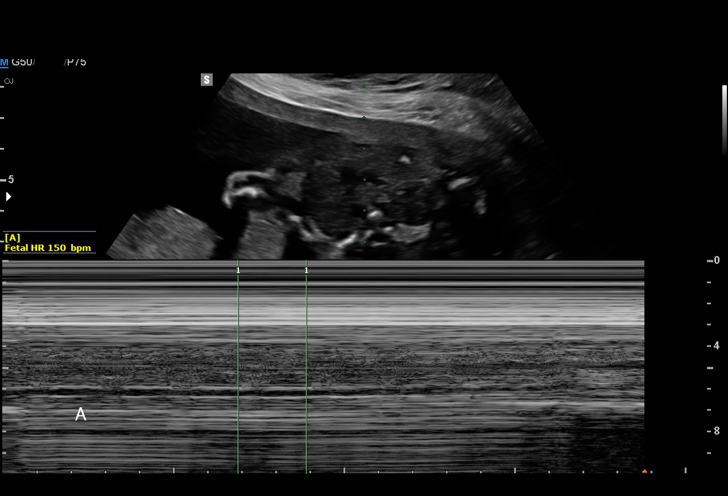
[im 13/67]
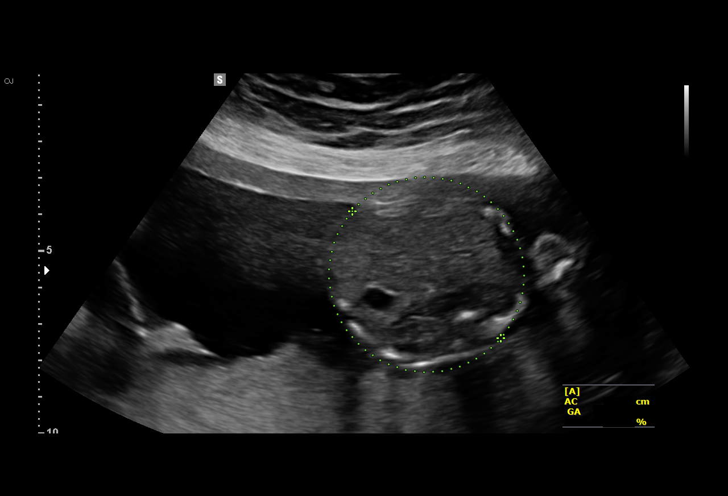
[im 20/67]
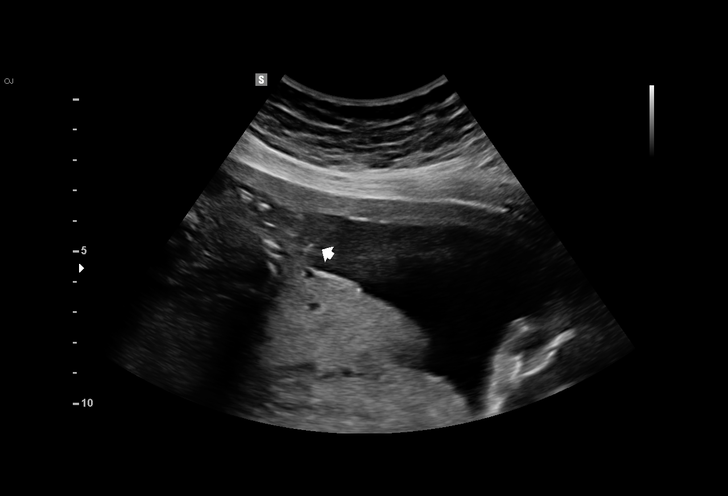
[im 25/67]
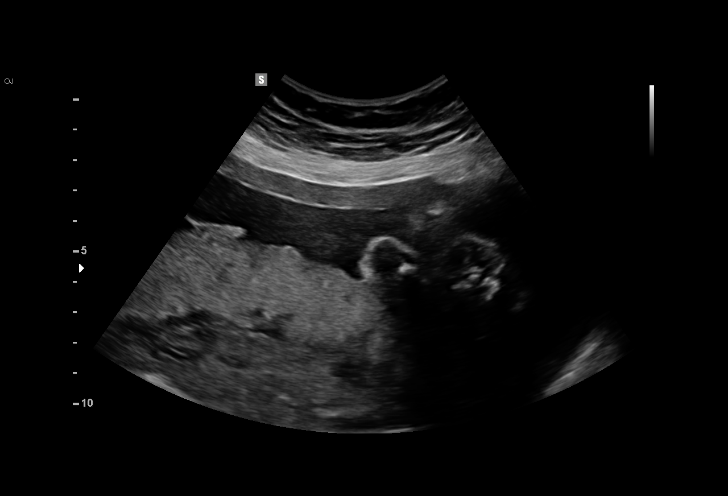
[im 30/67]
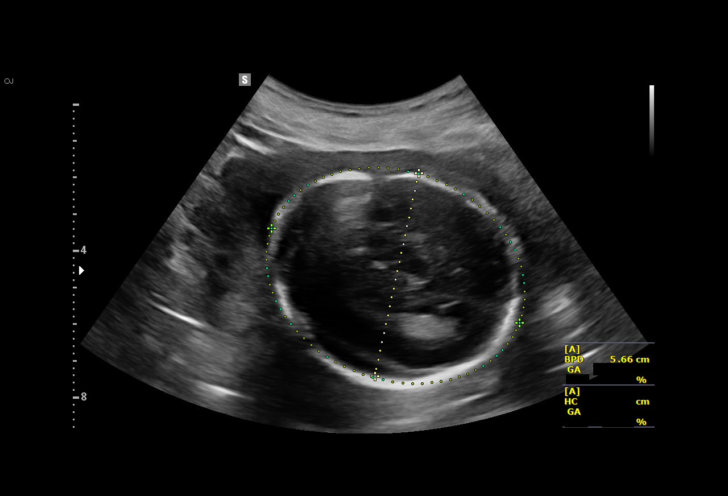
[im 37/67]
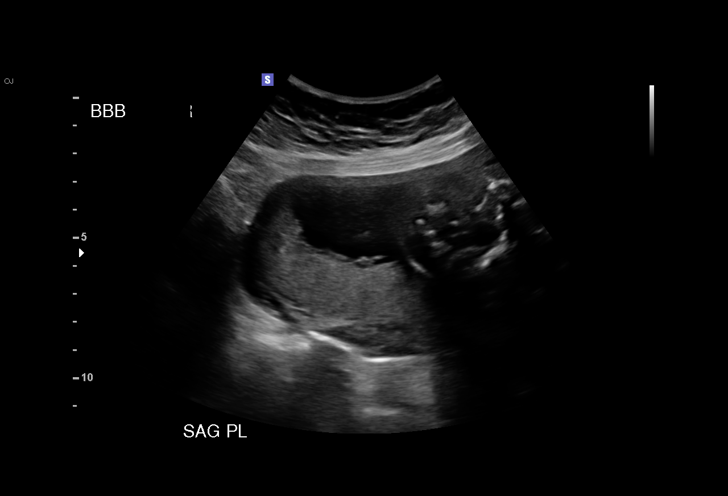
[im 42/67]
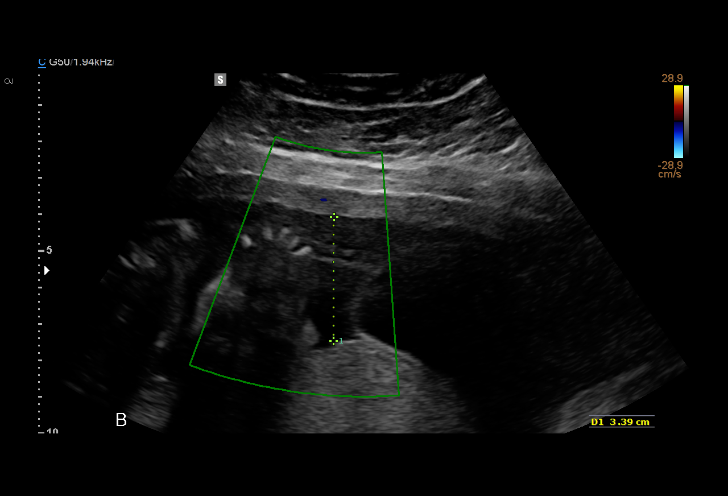
[im 47/67]
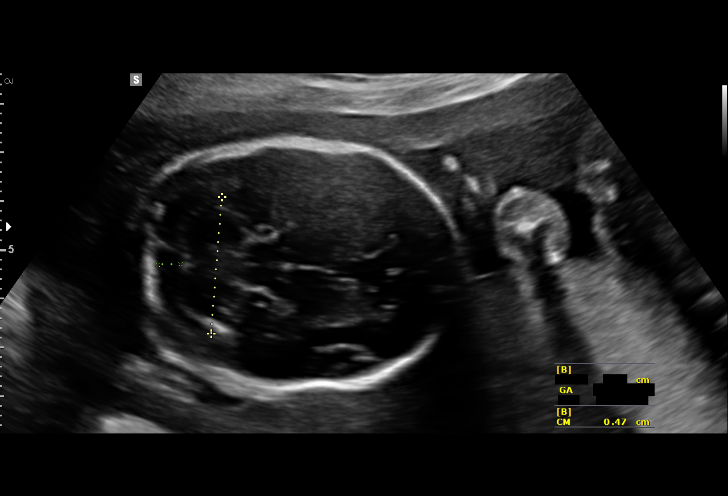
[im 54/67]
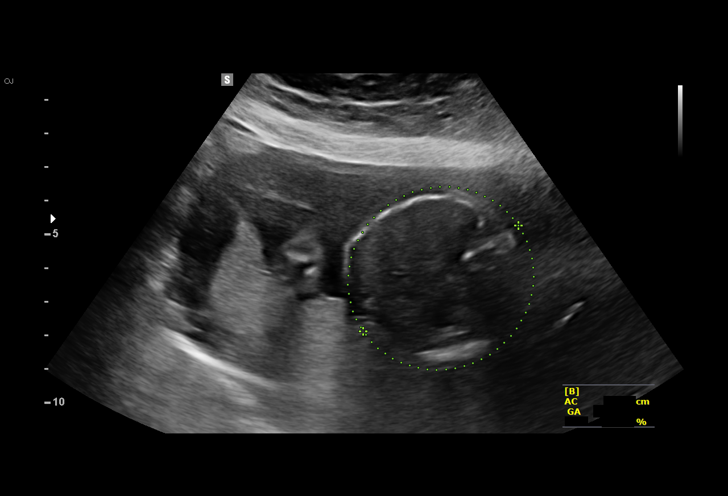
[im 59/67]
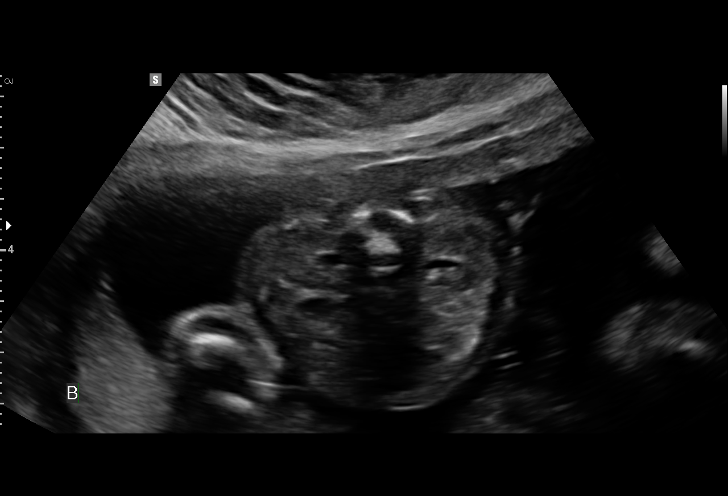
[im 64/67]
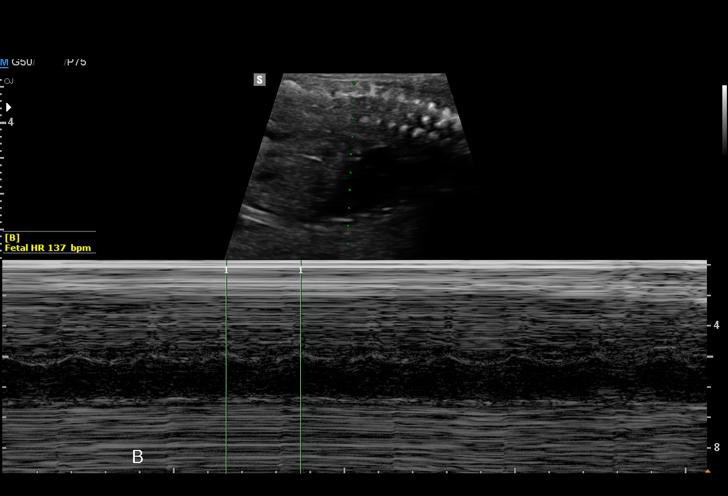

[12 of 28 positions shown; findings below may reference images not displayed]

Performed By:     Alphiaus Albertinah         Secondary Phy.:   GAO DA LONG LAMMARI
CHIKAMSO CNM
Faculty Practice
[REDACTED]-
Faculty Physician
[facility]

1  PREISIG BISSIG            [id number]      [id number]     [id number]
2  PREISIG BISSIG            [id number]      [id number]     [id number]
Indications

22 weeks gestation of pregnancy
Twin pregnancy, di/di, second trimester
Poor obstetric history: Previous preterm
delivery, antepartum 32 weeks
Previous cesarean delivery, antepartum
Family history of congenital anomaly;
previous child with gastroschisis (32 weeker)
OB History

Blood Type:            Height:  5'3"   Weight (lb):  194      BMI:
Gravidity:    7         Term:   2        Prem:   1        SAB:   1
TOP:          2        Living:  3
Fetal Evaluation (Fetus A)

Num Of Fetuses:     2
Fetal Heart         150
Rate(bpm):
Cardiac Activity:   Observed
Fetal Lie:          Lower fetus
Presentation:       Cephalic
Placenta:           Posterior, above cervical os
P. Cord Insertion:  Visualized
Membrane Desc:      Dividing Membrane seen - Monochorionic

Amniotic Fluid
AFI FV:      Subjectively within normal limits

Largest Pocket(cm)
4.1
Biometry (Fetus A)

BPD:      57.5  mm     G. Age:  23w 4d         82  %    CI:        81.68   %   70 - 86
FL/HC:      20.2   %   19.2 -
HC:      200.8  mm     G. Age:  22w 2d         24  %    HC/AC:      1.20       1.05 -
AC:      167.2  mm     G. Age:  21w 5d         19  %    FL/BPD:     70.4   %   71 - 87
FL:       40.5  mm     G. Age:  23w 1d         57  %    FL/AC:      24.2   %   20 - 24
HUM:      38.1  mm     G. Age:  23w 3d         66  %

Est. FW:     500  gm      1 lb 2 oz     47  %     FW Discordancy      0 \ 3 %
Gestational Age (Fetus A)

LMP:           22w 4d       Date:   10/16/15                 EDD:   07/22/16
U/S Today:     22w 5d                                        EDD:   07/21/16
Best:          22w 4d    Det. By:   LMP  (10/16/15)          EDD:   07/22/16
Anatomy (Fetus A)

Cranium:               Appears normal         Aortic Arch:            Previously seen
Cavum:                 Appears normal         Ductal Arch:            Previously seen
Ventricles:            Appears normal         Diaphragm:              Appears normal
Choroid Plexus:        Previously seen        Stomach:                Appears normal, left
sided
Cerebellum:            Previously seen        Abdomen:                Appears normal
Posterior Fossa:       Previously seen        Abdominal Wall:         Previously seen
Nuchal Fold:           Previously seen        Cord Vessels:           Appears normal (3
vessel cord)
Face:                  Orbits and profile     Kidneys:                Appear normal
previously seen
Lips:                  Previously seen        Bladder:                Appears normal
Thoracic:              Appears normal         Spine:                  Previously seen
Heart:                 Previously seen        Upper Extremities:      Previously seen
RVOT:                  Previously seen        Lower Extremities:      Previously seen
LVOT:                  Previously seen

Other:  Fetus appears to be a male. Heels and 5th digit prev.  visualized.
Nasal bone prev. visualized. Technically difficult due to fetal position.

Fetal Evaluation (Fetus B)

Num Of Fetuses:     2
Fetal Heart         137
Rate(bpm):
Cardiac Activity:   Observed
Fetal Lie:          Upper fetus
Presentation:       Cephalic
Placenta:           Posterior, above cervical os
P. Cord Insertion:  Visualized
Membrane Desc:      Dividing Membrane seen - Dichorionic.
Amniotic Fluid
AFI FV:      Subjectively within normal limits

Largest Pocket(cm)
3.4
Biometry (Fetus B)

BPD:      51.1  mm     G. Age:  21w 4d         11  %    CI:        71.46   %   70 - 86
FL/HC:      20.8   %   19.2 -
HC:      192.5  mm     G. Age:  21w 4d          7  %    HC/AC:      1.14       1.05 -
AC:      168.6  mm     G. Age:  21w 6d         21  %    FL/BPD:     78.3   %   71 - 87
FL:         40  mm     G. Age:  22w 6d         51  %    FL/AC:      23.7   %   20 - 24
HUM:        37  mm     G. Age:  22w 6d         53  %
CER:      28.1  mm     G. Age:  25w 1d       > 95  %

CM:        4.7  mm
Est. FW:     485  gm      1 lb 1 oz     42  %     FW Discordancy         3  %
Gestational Age (Fetus B)

LMP:           22w 4d       Date:   10/16/15                 EDD:   07/22/16
U/S Today:     22w 0d                                        EDD:   07/26/16
Best:          22w 4d    Det. By:   LMP  (10/16/15)          EDD:   07/22/16
Anatomy (Fetus B)

Cranium:               Appears normal         Aortic Arch:            Previously seen
Cavum:                 Appears normal         Ductal Arch:            Previously seen
Ventricles:            Appears normal         Diaphragm:              Appears normal
Choroid Plexus:        Previously seen        Stomach:                Appears normal, left
sided
Cerebellum:            Previously seen        Abdomen:                Appears normal
Posterior Fossa:       Previously seen        Abdominal Wall:         Previously seen
Nuchal Fold:           Previously seen        Cord Vessels:           Appears normal (3
vessel cord)
Face:                  Orbits and profile     Kidneys:                Appear normal
previously seen
Lips:                  Previously seen        Bladder:                Appears normal
Thoracic:              Appears normal         Spine:                  Previously seen
Heart:                 Previously seen        Upper Extremities:      Previously seen
RVOT:                  Previously seen        Lower Extremities:      Previously seen
LVOT:                  Appears normal

Other:  Fetus appears to be a male. Heels prev. visualized. Nasal bone
prev.visualized. Technically difficult due to fetal position.
Cervix Uterus Adnexa

Cervix
Length:            3.2  cm.
Normal appearance by transabdominal scan.

Uterus
No abnormality visualized.

Left Ovary
Within normal limits.
Right Ovary
Within normal limits.
Impression

DC/DA twin gestation at 22w 4d

Twin A:
Cephalic presentation, posterior placenta
Normal interval anatomy
The estimated fetal weight is at the 47th %tile
Normal amniotic fluid volume

Twin B:
Cephalic, posterior placenta
Normal interval anatomy
The estimated fetal weight is at the 42nd %tile
Normal amniotic fluid volume
Recommendations

Recommend follow-up ultrasound examination in 4 weeks for
interval growth

## 2017-05-08 ENCOUNTER — Encounter (HOSPITAL_COMMUNITY): Payer: Self-pay | Admitting: Emergency Medicine

## 2017-05-08 ENCOUNTER — Emergency Department (HOSPITAL_COMMUNITY): Payer: Medicaid Other

## 2017-05-08 ENCOUNTER — Emergency Department (HOSPITAL_COMMUNITY)
Admission: EM | Admit: 2017-05-08 | Discharge: 2017-05-08 | Disposition: A | Discharge status: Home / Self Care | Payer: Medicaid Other | Attending: Emergency Medicine | Admitting: Emergency Medicine

## 2017-05-08 DIAGNOSIS — M542 Cervicalgia: Secondary | ICD-10-CM

## 2017-05-08 DIAGNOSIS — S161XXA Strain of muscle, fascia and tendon at neck level, initial encounter: Secondary | ICD-10-CM | POA: Diagnosis not present

## 2017-05-08 DIAGNOSIS — Y939 Activity, unspecified: Secondary | ICD-10-CM | POA: Diagnosis not present

## 2017-05-08 DIAGNOSIS — Z79899 Other long term (current) drug therapy: Secondary | ICD-10-CM | POA: Diagnosis not present

## 2017-05-08 DIAGNOSIS — Y999 Unspecified external cause status: Secondary | ICD-10-CM | POA: Insufficient documentation

## 2017-05-08 DIAGNOSIS — S199XXA Unspecified injury of neck, initial encounter: Secondary | ICD-10-CM | POA: Diagnosis present

## 2017-05-08 DIAGNOSIS — R51 Headache: Secondary | ICD-10-CM | POA: Diagnosis not present

## 2017-05-08 DIAGNOSIS — Y929 Unspecified place or not applicable: Secondary | ICD-10-CM | POA: Diagnosis not present

## 2017-05-08 MED ORDER — METHOCARBAMOL 500 MG PO TABS: 500.0000 mg | ORAL_TABLET | Freq: Two times a day (BID) | ORAL | 0 refills | Status: DC

## 2017-05-08 MED ORDER — IBUPROFEN 600 MG PO TABS: 600.0000 mg | ORAL_TABLET | Freq: Four times a day (QID) | ORAL | 0 refills | Status: DC | PRN

## 2017-05-08 MED ORDER — IBUPROFEN 200 MG PO TABS
600.0000 mg | ORAL_TABLET | Freq: Once | ORAL | Status: AC
  Administered 2017-05-08: 600 mg via ORAL
  Filled 2017-05-08: qty 3
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
## 2017-07-18 ENCOUNTER — Encounter (HOSPITAL_COMMUNITY): Payer: Self-pay

## 2017-07-18 ENCOUNTER — Emergency Department (HOSPITAL_COMMUNITY)
Admission: EM | Admit: 2017-07-18 | Discharge: 2017-07-18 | Disposition: A | Discharge status: Home / Self Care | Payer: Medicaid Other | Attending: Emergency Medicine | Admitting: Emergency Medicine

## 2017-07-18 ENCOUNTER — Other Ambulatory Visit: Payer: Self-pay

## 2017-07-18 DIAGNOSIS — Y939 Activity, unspecified: Secondary | ICD-10-CM | POA: Diagnosis not present

## 2017-07-18 DIAGNOSIS — S0501XA Injury of conjunctiva and corneal abrasion without foreign body, right eye, initial encounter: Secondary | ICD-10-CM | POA: Insufficient documentation

## 2017-07-18 DIAGNOSIS — S0591XA Unspecified injury of right eye and orbit, initial encounter: Secondary | ICD-10-CM | POA: Diagnosis present

## 2017-07-18 DIAGNOSIS — Y999 Unspecified external cause status: Secondary | ICD-10-CM | POA: Insufficient documentation

## 2017-07-18 DIAGNOSIS — Y929 Unspecified place or not applicable: Secondary | ICD-10-CM | POA: Diagnosis not present

## 2017-07-18 DIAGNOSIS — Z79899 Other long term (current) drug therapy: Secondary | ICD-10-CM | POA: Insufficient documentation

## 2017-07-18 DIAGNOSIS — X58XXXA Exposure to other specified factors, initial encounter: Secondary | ICD-10-CM | POA: Diagnosis not present

## 2017-07-18 DIAGNOSIS — H53141 Visual discomfort, right eye: Secondary | ICD-10-CM | POA: Diagnosis not present

## 2017-07-18 DIAGNOSIS — R51 Headache: Secondary | ICD-10-CM | POA: Diagnosis not present

## 2017-07-18 MED ORDER — TETRACAINE HCL 0.5 % OP SOLN
1.0000 [drp] | Freq: Once | OPHTHALMIC | Status: AC
  Administered 2017-07-18: 1 [drp] via OPHTHALMIC
  Filled 2017-07-18: qty 4

## 2017-07-18 MED ORDER — FLUORESCEIN SODIUM 1 MG OP STRP
1.0000 | ORAL_STRIP | Freq: Once | OPHTHALMIC | Status: AC
  Administered 2017-07-18: 1 via OPHTHALMIC

## 2017-07-18 MED ORDER — TOBRAMYCIN-DEXAMETHASONE 0.3-0.1 % OP OINT: 1.0000 "application " | TOPICAL_OINTMENT | Freq: Three times a day (TID) | OPHTHALMIC | 0 refills | Status: AC

## 2017-07-18 NOTE — Discharge Instructions (Signed)
Apply antibiotic ointment as prescribed.  Follow-up with an ophthalmologist in the next 24-48 hours.  Return to the ED immediately for any concerning signs or symptom develop such as vision changes, fevers, abnormal drainage.  Do not wear contacts while you have this eye infection.

## 2017-07-18 NOTE — ED Provider Notes (Signed)
[facility]-EMERGENCY DEPT Provider Note   CSN: [id number] Arrival date & time: 07/18/17  1221     History   Chief Complaint Chief Complaint  Patient presents with  . eye redness  . Eye Pain    HPI Maria Olson is a 26 y.o. female who presents today with chief complaint acute onset, progressively worsening right eye redness and pain.  She states she awoke yesterday morning with erythema to the eye.  She endorses clear tearful drainage.  She endorses burning sensation to the eye which worsens with eye movements.  Also endorses a right-sided aching frontal headache.  Denies foreign body sensation or history of trauma to the eye.  Denies vision changes.  Endorses photophobia.  Denies fevers or chills.  She is a contact lens wearer.  She has been using her son's ophthalmic antibiotic drops as he is prone to conjunctivitis.  She states this has not been significantly helpful.  She states both of her sons have recently been treated for conjunctivitis.  The history is provided by the patient.    Past Medical History:  Diagnosis Date  . Anemia   . Infection    UTI  . Ovarian cyst   . Preterm labor     Patient Active Problem List   Diagnosis Date Noted  . Hx successful VBAC (vaginal birth after cesarean), currently pregnant 01/09/2016  . Hx of fetal anomaly in prior pregnancy, currently pregnant 01/09/2016    Past Surgical History:  Procedure Laterality Date  . CESAREAN SECTION    . CESAREAN SECTION MULTI-GESTATIONAL  06/14/2016   Procedure: CESAREAN SECTION MULTI-GESTATIONAL;  Surgeon: Catalina Antigua, MD;  Location: [facility];  Service: Obstetrics;;  . INDUCED ABORTION      OB History    Gravida Para Term Preterm AB Living   7 3 2 1 3 3    SAB TAB Ectopic Multiple Live Births   1 2 0 0 3       Home Medications    Prior to Admission medications   Medication Sig Start Date End Date Taking? Authorizing Provider  ibuprofen (ADVIL,MOTRIN)  600 MG tablet Take 1 tablet (600 mg total) by mouth every 6 (six) hours as needed. 05/08/17   Elson Areas, PA-C  methocarbamol (ROBAXIN) 500 MG tablet Take 1 tablet (500 mg total) by mouth 2 (two) times daily. 05/08/17   Elson Areas, PA-C  oxyCODONE 10 MG TABS Take 1 tablet (10 mg total) by mouth every 4 (four) hours as needed (pain scale > 7). 06/16/16   Montez Morita, CNM  Prenatal Vit-Fe Fumarate-FA (PRENATAL MULTIVITAMIN) TABS tablet Take 1 tablet by mouth daily.     [provider]  ranitidine (ZANTAC) 150 MG tablet Take 1 tablet (150 mg total) by mouth 2 (two) times daily. 05/20/16   Lorne Skeens, MD  tobramycin-dexamethasone [facility]) ophthalmic ointment Place 1 application into the right eye 3 (three) times daily for 7 days. 07/18/17 07/25/17  Jeanie Sewer, PA-C    Family History Family History  Problem Relation Age of Onset  . Asthma Brother   . Cancer Paternal Grandmother   . Asthma Daughter     Social History Social History   Tobacco Use  . Smoking status: Never Smoker  . Smokeless tobacco: Never Used  Substance Use Topics  . Alcohol use: No  . Drug use: No     Allergies   Patient has no known allergies.   Review of Systems Review  of Systems  Constitutional: Negative for chills and fever.  HENT: Negative for congestion, ear pain and sore throat.   Eyes: Positive for photophobia, pain, discharge, redness and itching. Negative for visual disturbance.  Neurological: Positive for headaches. Negative for syncope and numbness.     Physical Exam Updated Vital Signs BP 128/88 (BP Location: Left Arm)   Pulse 78   Temp 98.1 F (36.7 C) (Oral)   Resp 20   Ht 5\' 3"  (1.6 m)   Wt 68.9 kg (152 lb)   LMP 07/18/2017   SpO2 100%   BMI 26.93 kg/m   Physical Exam  Constitutional: She appears well-developed and well-nourished. No distress.  HENT:  Head: Normocephalic and atraumatic.  Eyes: Conjunctivae and EOM are normal. Pupils are equal,  round, and reactive to light. Right eye exhibits no discharge. Left eye exhibits no discharge.  Right eye with injected conjunctiva.  No foreign bodies noted.  No proptosis or chemosis.  She does have consensual photophobia.  Mild swelling of the eyelids but no significant erythema or tenderness to palpation.  No pain with EOMs. No hyphema or hypopyon.   On fluorescein stain, patient a small corneal abrasion in the inferior temporal portion of the cornea.  No foreign bodies noted.  No dendritic lesions noted.  Seidel sign absent.  Neck: No JVD present. No tracheal deviation present.  Cardiovascular: Normal rate.  Pulmonary/Chest: Effort normal.  Abdominal: She exhibits no distension.  Musculoskeletal: She exhibits no edema.  Neurological: She is alert.  Skin: Skin is warm and dry. No erythema.  Psychiatric: She has a normal mood and affect. Her behavior is normal.  Nursing note and vitals reviewed.    ED Treatments / Results  Labs (all labs ordered are listed, but only abnormal results are displayed) Labs Reviewed - No data to display  EKG  EKG Interpretation None       Radiology No results found.  Procedures Procedures (including critical care time)  Medications Ordered in ED Medications  fluorescein ophthalmic strip 1 strip (1 strip Right Eye Given 07/18/17 1315)  tetracaine (PONTOCAINE) 0.5 % ophthalmic solution 1 drop (1 drop Right Eye Given 07/18/17 1315)     Initial Impression / Assessment and Plan / ED Course  I have reviewed the triage vital signs and the nursing notes.  Pertinent labs & imaging results that were available during my care of the patient were reviewed by me and considered in my medical decision making (see chart for details).      Pt with corneal abrasion on PE. Tdap UTD. No evidence of FB.  No change in vision, acuity equal bilaterally.  Exam non-concerning for orbital cellulitis, hyphema, corneal ulcers.  Some concern for bacterial keratitis  as patient is a contact lens wearer, although I have a low suspicion of this in the absence of reduced visual acuity, eyelid swelling, corneal edema, or anterior chamber abnormalities.  no evidence of acute angle-closure glaucoma. patient will be discharged home with erythromycin.   Patient understands to follow up with ophthalmology in 24-48 hours for re-evaluation, & to return to ER if new symptoms develop including change in vision, purulent drainage, or entrapment.  Patient has no complaints prior to discharge.    Final Clinical Impressions(s) / ED Diagnoses   Final diagnoses:  Abrasion of right cornea, initial encounter    ED Discharge Orders        Ordered    tobramycin-dexamethasone (TOBRADEX) ophthalmic ointment  3 times daily  07/18/17 1322       Jeanie SewerFawze, Zamir Staples A, PA-C 07/18/17 1433    Nira Connardama, Pedro Eduardo, MD 07/19/17 2001

## 2017-07-18 NOTE — ED Triage Notes (Signed)
Patient states she woke with right eye pain and redness today. Patient states her son was diagnosed with pink eye recently.

## 2017-07-18 NOTE — ED Notes (Signed)
Pt reports rt eye pain 10/10 burning and throbbing x 2 days. Pt report contact with son who has been dx with conjunctivitis. Pt Rt eye appears red and is watery. Pt is alert and oriented x 4 , verbally responsive.

## 2017-10-22 ENCOUNTER — Inpatient Hospital Stay (HOSPITAL_COMMUNITY)
Admission: AD | Admit: 2017-10-22 | Discharge: 2017-10-22 | Disposition: A | Discharge status: Home / Self Care | Payer: Medicaid Other | Source: Ambulatory Visit | Attending: Obstetrics and Gynecology | Admitting: Obstetrics and Gynecology

## 2017-10-22 ENCOUNTER — Encounter (HOSPITAL_COMMUNITY): Payer: Self-pay | Admitting: *Deleted

## 2017-10-22 ENCOUNTER — Other Ambulatory Visit: Payer: Self-pay

## 2017-10-22 DIAGNOSIS — D649 Anemia, unspecified: Secondary | ICD-10-CM | POA: Insufficient documentation

## 2017-10-22 DIAGNOSIS — Z8619 Personal history of other infectious and parasitic diseases: Secondary | ICD-10-CM | POA: Diagnosis not present

## 2017-10-22 DIAGNOSIS — Z202 Contact with and (suspected) exposure to infections with a predominantly sexual mode of transmission: Secondary | ICD-10-CM | POA: Insufficient documentation

## 2017-10-22 HISTORY — DX: Headache: R51

## 2017-10-22 HISTORY — DX: Headache, unspecified: R51.9

## 2017-10-22 HISTORY — DX: Chlamydial infection, unspecified: A74.9

## 2017-10-22 LAB — WET PREP, GENITAL
Sperm: NONE SEEN
Trich, Wet Prep: NONE SEEN
Yeast Wet Prep HPF POC: NONE SEEN

## 2017-10-22 LAB — URINALYSIS, ROUTINE W REFLEX MICROSCOPIC
BILIRUBIN URINE: NEGATIVE
Glucose, UA: NEGATIVE mg/dL
HGB URINE DIPSTICK: NEGATIVE
Ketones, ur: NEGATIVE mg/dL
NITRITE: NEGATIVE
PROTEIN: NEGATIVE mg/dL
SPECIFIC GRAVITY, URINE: 1.029 (ref 1.005–1.030)
pH: 5 (ref 5.0–8.0)

## 2017-10-22 LAB — POCT PREGNANCY, URINE: PREG TEST UR: NEGATIVE

## 2017-10-22 NOTE — Discharge Instructions (Signed)
In late 2019, the [facility] will be moving to the [place] campus. At that time, the MAU (Maternity Admissions Unit), where you are being seen today, will no longer take care of non-pregnant patients. We strongly encourage you to find a doctor's office before that time, so that you can be seen with any GYN concerns, like vaginal discharge, urinary tract infection, etc.. in a timely manner. ° °In order to make an office visit more convenient, the [facility] will be offering evening hours with same-day appointments, walk-in appointments and scheduled appointments available during this time. ° °[facility] Hours: °Monday - 8am - 7:30 pm with walk-in between 4pm- 7:30 pm °Tuesday - 8 am - 5 pm (starting 11/18/17 we will be open late and accepting walk-ins from 4pm - 7:30pm) °Wednesday - 8 am - 5 pm (starting 02/18/18 we will be open late and accepting walk-ins from 4pm - 7:30pm) °Thursday 8 am - 5 pm (starting 05/21/18 we will be open late and accepting walk-ins from 4pm - 7:30pm) °Friday 8 am - 5 pm ° °For an appointment please call the [facility] at [phone] ° °For urgent needs, [facility] is also available for management of urgent GYN complaints such as vaginal discharge or urinary tract infections. ° ° ° ° ° °

## 2017-10-22 NOTE — MAU Provider Note (Signed)
History     CSN: [id number]  Arrival date and time: 10/22/17 [id number]   First Provider Initiated Contact with Patient 10/22/17 1040     Chief Complaint  Patient presents with  . possible std exposure   HPI Maria Olson is a 27 y.o. [phone] non pregnant female who presents requesting STD screening. She states her significant other called and told her he was having burning with urination and he was going to get tested today. She denies any pain or discharge. She attempted to go to the clinic but could not be seen.   OB History    Gravida Para Term Preterm AB Living   7 4 2 2 3 5    SAB TAB Ectopic Multiple Live Births   1 2 0 1 5      Obstetric Comments   2017 del c/s for twins      Past Medical History:  Diagnosis Date  . Anemia   . Chlamydia   . Headache   . Infection    UTI  . Ovarian cyst   . Preterm labor     Past Surgical History:  Procedure Laterality Date  . CESAREAN SECTION    . CESAREAN SECTION MULTI-GESTATIONAL  06/14/2016   Procedure: CESAREAN SECTION MULTI-GESTATIONAL;  Surgeon: Catalina AntiguaPeggy Constant, MD;  Location: [facility];  Service: Obstetrics;;  . INDUCED ABORTION      Family History  Problem Relation Age of Onset  . Asthma Brother   . Asthma Daughter   . Asthma Son     Social History   Tobacco Use  . Smoking status: Never Smoker  . Smokeless tobacco: Never Used  Substance Use Topics  . Alcohol use: No  . Drug use: No    Allergies: No Known Allergies  Medications Prior to Admission  Medication Sig Dispense Refill Last Dose  . ibuprofen (ADVIL,MOTRIN) 600 MG tablet Take 1 tablet (600 mg total) by mouth every 6 (six) hours as needed. 30 tablet 0   . methocarbamol (ROBAXIN) 500 MG tablet Take 1 tablet (500 mg total) by mouth 2 (two) times daily. 20 tablet 0   . oxyCODONE 10 MG TABS Take 1 tablet (10 mg total) by mouth every 4 (four) hours as needed (pain scale > 7). 30 tablet 0   . Prenatal Vit-Fe Fumarate-FA (PRENATAL MULTIVITAMIN)  TABS tablet Take 1 tablet by mouth daily.    Past Week at Unknown time  . ranitidine (ZANTAC) 150 MG tablet Take 1 tablet (150 mg total) by mouth 2 (two) times daily. 60 tablet 1 06/13/2016 at Unknown time    Review of Systems  Constitutional: Negative.  Negative for fatigue and fever.  HENT: Negative.   Respiratory: Negative.  Negative for shortness of breath.   Cardiovascular: Negative.  Negative for chest pain.  Gastrointestinal: Negative.  Negative for abdominal pain, constipation, diarrhea, nausea and vomiting.  Genitourinary: Negative.  Negative for dysuria, vaginal bleeding and vaginal discharge.  Neurological: Negative.  Negative for dizziness and headaches.   Physical Exam   Blood pressure 131/78, pulse 64, temperature 98.4 F (36.9 C), temperature source Oral, resp. rate 16, weight 142 lb 12 oz (64.8 kg), last menstrual period 09/25/2017, SpO2 100 %, unknown if currently breastfeeding.  Physical Exam  Nursing note and vitals reviewed. Constitutional: She is oriented to person, place, and time. She appears well-developed and well-nourished. No distress.  HENT:  Head: Normocephalic.  Eyes: Pupils are equal, round, and reactive to light.  Cardiovascular: Normal rate, regular rhythm  and normal heart sounds.  Respiratory: Effort normal and breath sounds normal. No respiratory distress.  GI: Soft. Bowel sounds are normal. She exhibits no distension. There is no tenderness.  Neurological: She is alert and oriented to person, place, and time.  Skin: Skin is warm and dry.  Psychiatric: She has a normal mood and affect. Her behavior is normal. Judgment and thought content normal.    MAU Course  Procedures Results for orders placed or performed during the hospital encounter of 10/22/17 (from the past 24 hour(s))  Urinalysis, Routine w reflex microscopic     Status: Abnormal   Collection Time: 10/22/17  9:53 AM  Result Value Ref Range   Color, Urine YELLOW YELLOW   APPearance  HAZY (A) CLEAR   Specific Gravity, Urine 1.029 1.005 - 1.030   pH 5.0 5.0 - 8.0   Glucose, UA NEGATIVE NEGATIVE mg/dL   Hgb urine dipstick NEGATIVE NEGATIVE   Bilirubin Urine NEGATIVE NEGATIVE   Ketones, ur NEGATIVE NEGATIVE mg/dL   Protein, ur NEGATIVE NEGATIVE mg/dL   Nitrite NEGATIVE NEGATIVE   Leukocytes, UA MODERATE (A) NEGATIVE   RBC / HPF 0-5 0 - 5 RBC/hpf   WBC, UA 6-30 0 - 5 WBC/hpf   Bacteria, UA RARE (A) NONE SEEN   Squamous Epithelial / LPF 6-30 (A) NONE SEEN   Mucus PRESENT   Wet prep, genital     Status: Abnormal   Collection Time: 10/22/17 10:12 AM  Result Value Ref Range   Yeast Wet Prep HPF POC NONE SEEN NONE SEEN   Trich, Wet Prep NONE SEEN NONE SEEN   Clue Cells Wet Prep HPF POC PRESENT (A) NONE SEEN   WBC, Wet Prep HPF POC FEW (A) NONE SEEN   Sperm NONE SEEN    MDM UA Wet prep and gc/chlamydia HIV RPR  Assessment and Plan   1. Possible exposure to STD    -Discharge home in stable condition -Safe sexual practices discussed -Will call patient if other labs positive -Patient advised to follow-[facility] with [facility] for routine gyn care -Patient may return to MAU as needed or if her condition were to change or worsen  Rolm Bookbinder CNM 10/22/2017, 10:40 AM

## 2017-10-22 NOTE — MAU Note (Signed)
Partner told her he was having burning with urination, has gone to get tested and suggested she be tested also.  No problems or complaints.

## 2017-10-23 LAB — HIV ANTIBODY (ROUTINE TESTING W REFLEX): HIV Screen 4th Generation wRfx: NONREACTIVE

## 2017-10-23 LAB — GC/CHLAMYDIA PROBE AMP (~~LOC~~) NOT AT ARMC
Chlamydia: NEGATIVE
Neisseria Gonorrhea: NEGATIVE

## 2017-10-23 LAB — RPR: RPR Ser Ql: NONREACTIVE

## 2017-12-02 ENCOUNTER — Encounter: Payer: Self-pay | Admitting: *Deleted

## 2018-01-05 ENCOUNTER — Inpatient Hospital Stay (HOSPITAL_COMMUNITY)
Admission: AD | Admit: 2018-01-05 | Discharge: 2018-01-05 | Disposition: A | Discharge status: Home / Self Care | Payer: Medicaid Other | Source: Ambulatory Visit | Attending: Obstetrics and Gynecology | Admitting: Obstetrics and Gynecology

## 2018-01-05 DIAGNOSIS — Z9889 Other specified postprocedural states: Secondary | ICD-10-CM | POA: Diagnosis not present

## 2018-01-05 DIAGNOSIS — A599 Trichomoniasis, unspecified: Secondary | ICD-10-CM | POA: Diagnosis not present

## 2018-01-05 DIAGNOSIS — Z79899 Other long term (current) drug therapy: Secondary | ICD-10-CM | POA: Insufficient documentation

## 2018-01-05 DIAGNOSIS — Z825 Family history of asthma and other chronic lower respiratory diseases: Secondary | ICD-10-CM | POA: Insufficient documentation

## 2018-01-05 DIAGNOSIS — N898 Other specified noninflammatory disorders of vagina: Secondary | ICD-10-CM | POA: Diagnosis present

## 2018-01-05 DIAGNOSIS — Z791 Long term (current) use of non-steroidal anti-inflammatories (NSAID): Secondary | ICD-10-CM | POA: Insufficient documentation

## 2018-01-05 LAB — URINALYSIS, ROUTINE W REFLEX MICROSCOPIC
BILIRUBIN URINE: NEGATIVE
Glucose, UA: NEGATIVE mg/dL
Hgb urine dipstick: NEGATIVE
KETONES UR: NEGATIVE mg/dL
Nitrite: NEGATIVE
Protein, ur: NEGATIVE mg/dL
Specific Gravity, Urine: 1.027 (ref 1.005–1.030)
WBC, UA: >50 WBC/hpf — ABNORMAL HIGH (ref 0–5)
pH: 6 (ref 5.0–8.0)

## 2018-01-05 LAB — WET PREP, GENITAL
Clue Cells Wet Prep HPF POC: NONE SEEN
Sperm: NONE SEEN
Yeast Wet Prep HPF POC: NONE SEEN

## 2018-01-05 LAB — CBC
HCT: 38.3 % (ref 36.0–46.0)
Hemoglobin: 12.5 g/dL (ref 12.0–15.0)
MCH: 29.4 pg (ref 26.0–34.0)
MCHC: 32.6 g/dL (ref 30.0–36.0)
MCV: 90.1 fL (ref 78.0–100.0)
PLATELETS: 372 10*3/uL (ref 150–400)
RBC: 4.25 MIL/uL (ref 3.87–5.11)
RDW: 13.2 % (ref 11.5–15.5)
WBC: 4.3 10*3/uL (ref 4.0–10.5)

## 2018-01-05 LAB — POCT PREGNANCY, URINE: Preg Test, Ur: NEGATIVE

## 2018-01-05 NOTE — MAU Note (Signed)
Pt reports a milky white vaginal discharge, ? Vaginal burning, itching. Used monistat and symptoms were not getting better. Pt seen at [facility] and they had her swab herself and gave her multiple antibiotic.

## 2018-01-05 NOTE — MAU Provider Note (Signed)
History     CSN: [id number]  Arrival date and time: 01/05/18 1021   First Provider Initiated Contact with Patient 01/05/18 1310      Chief Complaint  Patient presents with  . Vaginal Discharge   HPI  Ms.  Maria Olson is a 27 y.o. year old [id number] female who presents to MAU reporting she was seen at [facility] 2-3 days ago for "a severe yeast infection", self collected STD testing, received "a shot", Zithromax 1000 mg, Diflucan for yeast infection, and Metronidazole 2000 mg. She states that she took some liquid Amoxicillin of her daughter's for what she thought to be an infection of her tooth. She states that it gave her "a bad yeast infection"; which is why she went to [facility] to get tested and treated. She "got so sick from the bright Zithromax, that I haven't taken the Metronidazole yet". She did take the Diflucan, but "feels like there's still a problem". She complains that although she received treatment with "a bunch of meds", she still has "milky white, wetter and warmer feeling vaginal discharge".    Past Medical History:  Diagnosis Date  . Anemia   . Chlamydia   . Headache   . Infection    UTI  . Ovarian cyst   . Preterm labor     Past Surgical History:  Procedure Laterality Date  . CESAREAN SECTION    . CESAREAN SECTION MULTI-GESTATIONAL  06/14/2016   Procedure: CESAREAN SECTION MULTI-GESTATIONAL;  Surgeon: Catalina Antigua, MD;  Location: [facility];  Service: Obstetrics;;  . INDUCED ABORTION      Family History  Problem Relation Age of Onset  . Asthma Brother   . Asthma Daughter   . Asthma Son     Social History   Tobacco Use  . Smoking status: Never Smoker  . Smokeless tobacco: Never Used  Substance Use Topics  . Alcohol use: No  . Drug use: No    Allergies: No Known Allergies  Medications Prior to Admission  Medication Sig Dispense Refill Last Dose  . ibuprofen (ADVIL,MOTRIN) 600 MG tablet Take 1 tablet (600 mg total) by  mouth every 6 (six) hours as needed. 30 tablet 0   . methocarbamol (ROBAXIN) 500 MG tablet Take 1 tablet (500 mg total) by mouth 2 (two) times daily. 20 tablet 0   . oxyCODONE 10 MG TABS Take 1 tablet (10 mg total) by mouth every 4 (four) hours as needed (pain scale > 7). 30 tablet 0   . Prenatal Vit-Fe Fumarate-FA (PRENATAL MULTIVITAMIN) TABS tablet Take 1 tablet by mouth daily.    Past Week at Unknown time  . ranitidine (ZANTAC) 150 MG tablet Take 1 tablet (150 mg total) by mouth 2 (two) times daily. 60 tablet 1 06/13/2016 at Unknown time    Review of Systems  Constitutional: Negative.   HENT: Negative.   Eyes: Negative.   Respiratory: Negative.   Cardiovascular: Negative.   Gastrointestinal: Negative.   Genitourinary: Positive for vaginal discharge ("milky, white, wetter, & warmer").  Musculoskeletal: Negative.   Skin: Negative.   Allergic/Immunologic: Negative.   Neurological: Negative.   Hematological: Negative.   Psychiatric/Behavioral: Negative.    Physical Exam   Blood pressure 120/71, pulse 73, temperature 98.4 F (36.9 C), temperature source Oral, resp. rate 15, height  (1.575 m), weight 138 lb (62.6 kg), last menstrual period 12/17/2017, SpO2 100 %.  Physical Exam  Nursing note and vitals reviewed. Constitutional: She is oriented to person, place,  and time. She appears well-developed and well-nourished.  HENT:  Head: Normocephalic and atraumatic.  Eyes: Pupils are equal, round, and reactive to light.  Neck: Normal range of motion.  Cardiovascular: Normal rate, regular rhythm and normal heart sounds.  Respiratory: Effort normal and breath sounds normal.  GI: Soft. Bowel sounds are normal.  Genitourinary:  Genitourinary Comments: Wet pre, GC/CT by self-collection  Musculoskeletal: Normal range of motion.  Neurological: She is alert and oriented to person, place, and time. She has normal reflexes.  Skin: Skin is warm and dry.  Psychiatric: She has a normal mood  and affect. Her behavior is normal. Judgment and thought content normal.    MAU Course  Procedures  MDM CCUA UPT UCx Wet Prep GC/CT -- pending HIV CBC RPR  Results for orders placed or performed during the hospital encounter of 01/05/18 (from the past 24 hour(s))  Wet prep, genital     Status: Abnormal   Collection Time: 01/05/18 11:05 AM  Result Value Ref Range   Yeast Wet Prep HPF POC NONE SEEN NONE SEEN   Trich, Wet Prep PRESENT (A) NONE SEEN   Clue Cells Wet Prep HPF POC NONE SEEN NONE SEEN   WBC, Wet Prep HPF POC FEW (A) NONE SEEN   Sperm NONE SEEN   Urinalysis, Routine w reflex microscopic     Status: Abnormal   Collection Time: 01/05/18 11:10 AM  Result Value Ref Range   Color, Urine YELLOW YELLOW   APPearance CLOUDY (A) CLEAR   Specific Gravity, Urine 1.027 1.005 - 1.030   pH 6.0 5.0 - 8.0   Glucose, UA NEGATIVE NEGATIVE mg/dL   Hgb urine dipstick NEGATIVE NEGATIVE   Bilirubin Urine NEGATIVE NEGATIVE   Ketones, ur NEGATIVE NEGATIVE mg/dL   Protein, ur NEGATIVE NEGATIVE mg/dL   Nitrite NEGATIVE NEGATIVE   Leukocytes, UA LARGE (A) NEGATIVE   RBC / HPF 11-20 0 - 5 RBC/hpf   WBC, UA >50 (H) 0 - 5 WBC/hpf   Bacteria, UA RARE (A) NONE SEEN   Squamous Epithelial / LPF 21-50 0 - 5   WBC Clumps PRESENT    Mucus PRESENT   CBC     Status: None   Collection Time: 01/05/18 11:15 AM  Result Value Ref Range   WBC 4.3 4.0 - 10.5 K/uL   RBC 4.25 3.87 - 5.11 MIL/uL   Hemoglobin 12.5 12.0 - 15.0 g/dL   HCT 16.1 09.6 - 04.5 %   MCV 90.1 78.0 - 100.0 fL   MCH 29.4 26.0 - 34.0 pg   MCHC 32.6 30.0 - 36.0 g/dL   RDW 40.9 81.1 - 91.4 %   Platelets 372 150 - 400 K/uL  Pregnancy, urine POC     Status: None   Collection Time: 01/05/18 11:37 AM  Result Value Ref Range   Preg Test, Ur NEGATIVE NEGATIVE    Assessment and Plan  Trichomonosis - Plan: Discharge patient - Advised to take the Flagyl 2 grams she already has to treat trich - Rx written for [employer] DOB:  10/19/1986 (reported sexual partner) for expedited partner therapy - Information provided on Trich, STDs, safe sex and preventing STIs - Advised of upcoming MAU GYN care changes; info provided - Advised to schedule TOC/Follow-up care with [facility]  - Discharge home - Patient verbalized an understanding of the plan of care and agrees.   Raelyn Mora, MSN, CNM 01/05/2018, 1:10 PM

## 2018-01-05 NOTE — Discharge Instructions (Signed)
You already have a prescription for the medication needed to treat Trichomonas (Metronidazole 2,000 mg). Take all 4 pills 30 minutes after eating a meal.   In late 2019, the [facility] will be moving to the [facility] campus. At that time, the MAU (Maternity Admissions Unit), where you are being seen today, will no longer take care of non-pregnant patients. We strongly encourage you to find a doctor's office before that time, so that you can be seen with any GYN concerns, like vaginal discharge, urinary tract infection, etc.. in a timely manner.  In order to make an office visit more convenient, the Center for [facility] at [facility] will be offering evening hours with same-day appointments, walk-in appointments and scheduled appointments available during this time.  Center for [facility] @ [facility] Hours: Monday - 8am - 7:30 pm with walk-in between 4pm- 7:30 pm Tuesday - 8 am - 5 pm (starting 11/18/17 we will be open late and accepting walk-ins from 4pm - 7:30pm) Wednesday - 8 am - 5 pm (starting 02/18/18 we will be open late and accepting walk-ins from 4pm - 7:30pm) Thursday 8 am - 5 pm (starting 05/21/18 we will be open late and accepting walk-ins from 4pm - 7:30pm) Friday 8 am - 5 pm  For an appointment please call the Center for [facility] @ [facility] at [phone]  For urgent needs, [facility] is also available for management of urgent GYN complaints such as vaginal discharge or urinary tract infections.

## 2018-01-05 NOTE — MAU Note (Signed)
Not in lobby

## 2018-01-06 LAB — RPR: RPR Ser Ql: NONREACTIVE

## 2018-01-06 LAB — GC/CHLAMYDIA PROBE AMP (~~LOC~~) NOT AT ARMC
CHLAMYDIA, DNA PROBE: NEGATIVE
Neisseria Gonorrhea: NEGATIVE

## 2018-01-06 LAB — HIV ANTIBODY (ROUTINE TESTING W REFLEX): HIV Screen 4th Generation wRfx: NONREACTIVE

## 2018-03-20 ENCOUNTER — Encounter (HOSPITAL_COMMUNITY): Payer: Self-pay | Admitting: Emergency Medicine

## 2018-03-20 ENCOUNTER — Emergency Department (HOSPITAL_COMMUNITY)
Admission: EM | Admit: 2018-03-20 | Discharge: 2018-03-20 | Disposition: A | Discharge status: Home / Self Care | Payer: No Typology Code available for payment source | Attending: Emergency Medicine | Admitting: Emergency Medicine

## 2018-03-20 DIAGNOSIS — M542 Cervicalgia: Secondary | ICD-10-CM | POA: Insufficient documentation

## 2018-03-20 DIAGNOSIS — Z79899 Other long term (current) drug therapy: Secondary | ICD-10-CM | POA: Diagnosis not present

## 2018-03-20 DIAGNOSIS — M545 Low back pain: Secondary | ICD-10-CM | POA: Diagnosis not present

## 2018-03-20 MED ORDER — METHOCARBAMOL 500 MG PO TABS: 500.0000 mg | ORAL_TABLET | Freq: Two times a day (BID) | ORAL | 0 refills | Status: DC
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
## 2018-03-20 NOTE — ED Notes (Signed)
Called pt did not answer 

## 2018-03-20 NOTE — ED Notes (Signed)
Called pt in lobby with no answer 

## 2018-03-20 NOTE — ED Notes (Signed)
Patient able to ambulate independently  
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
## 2018-10-20 ENCOUNTER — Other Ambulatory Visit: Payer: Self-pay

## 2018-10-20 ENCOUNTER — Encounter (HOSPITAL_COMMUNITY): Payer: Self-pay | Admitting: Emergency Medicine

## 2018-10-20 ENCOUNTER — Emergency Department (HOSPITAL_COMMUNITY)
Admission: EM | Admit: 2018-10-20 | Discharge: 2018-10-20 | Disposition: A | Discharge status: Home / Self Care | Payer: Medicaid Other | Attending: Emergency Medicine | Admitting: Emergency Medicine

## 2018-10-20 DIAGNOSIS — Z79899 Other long term (current) drug therapy: Secondary | ICD-10-CM | POA: Insufficient documentation

## 2018-10-20 DIAGNOSIS — J069 Acute upper respiratory infection, unspecified: Secondary | ICD-10-CM

## 2018-10-20 MED ORDER — BENZONATATE 100 MG PO CAPS: 100.0000 mg | ORAL_CAPSULE | Freq: Three times a day (TID) | ORAL | 0 refills | Status: DC

## 2018-10-20 NOTE — Discharge Instructions (Signed)
Please read attached information. If you experience any new or worsening signs or symptoms please return to the emergency room for evaluation. Please follow-up with your primary care provider or specialist as discussed. Please use medication prescribed only as directed and discontinue taking if you have any concerning signs or symptoms.   °

## 2018-10-20 NOTE — ED Notes (Signed)
Declined W/C at D/C and was escorted to lobby by RN. 

## 2018-10-20 NOTE — ED Triage Notes (Signed)
Pt in with fever, cough and chills x 3 days. States she has been taking dayquil x few days but still has non-productive cough.

## 2018-10-20 NOTE — ED Provider Notes (Signed)
[facility] EMERGENCY DEPARTMENT Provider Note   CSN: [id number] Arrival date & time: 10/20/18  1022   History   Chief Complaint Chief Complaint  Patient presents with  . Fever  . Cough  . Chills    HPI Maria Olson is a 28 y.o. female.     HPI   28 year old female presents today with complaints of respiratory infection.  Patient notes 3 days ago she developed rhinorrhea nasal congestion and irritant cough.  She denies any production with cough.  She denies any shortness of breath.  She denies any significant past medical history.  No close sick contacts, no recent travel or contact with recent travelers.  She notes using over-the-counter cough medication without symptomatic improvement.     Past Medical History:  Diagnosis Date  . Anemia   . Chlamydia   . Headache   . Infection    UTI  . Ovarian cyst   . Preterm labor     Patient Active Problem List   Diagnosis Date Noted  . Hx successful VBAC (vaginal birth after cesarean), currently pregnant 01/09/2016  . Hx of fetal anomaly in prior pregnancy, currently pregnant 01/09/2016    Past Surgical History:  Procedure Laterality Date  . CESAREAN SECTION    . CESAREAN SECTION MULTI-GESTATIONAL  06/14/2016   Procedure: CESAREAN SECTION MULTI-GESTATIONAL;  Surgeon: Catalina Antigua, MD;  [facility];  Service: Obstetrics;;  . INDUCED ABORTION       OB History    Gravida  7   Para  4   Term  2   Preterm  2   AB  3   Living  5     SAB  1   TAB  2   Ectopic  0   Multiple  1   Live Births  5        Obstetric Comments  2017 del c/s for twins         Home Medications    Prior to Admission medications   Medication Sig Start Date End Date Taking? Authorizing Provider  benzonatate (TESSALON) 100 MG capsule Take 1 capsule (100 mg total) by mouth every 8 (eight) hours. 10/20/18   Oran Dillenburg, Tinnie Gens, PA-C  ibuprofen (ADVIL,MOTRIN) 600 MG tablet Take 1 tablet (600 mg  total) by mouth every 6 (six) hours as needed. 05/08/17   Elson Areas, PA-C  methocarbamol (ROBAXIN) 500 MG tablet Take 1 tablet (500 mg total) by mouth 2 (two) times daily. 03/20/18   Harlene Salts A, PA-C  oxyCODONE 10 MG TABS Take 1 tablet (10 mg total) by mouth every 4 (four) hours as needed (pain scale > 7). 06/16/16   Montez Morita, CNM  Prenatal Vit-Fe Fumarate-FA (PRENATAL MULTIVITAMIN) TABS tablet Take 1 tablet by mouth daily.     [provider]  ranitidine (ZANTAC) 150 MG tablet Take 1 tablet (150 mg total) by mouth 2 (two) times daily. 05/20/16   Lorne Skeens, MD    Family History Family History  Problem Relation Age of Onset  . Asthma Brother   . Asthma Daughter   . Asthma Son     Social History Social History   Tobacco Use  . Smoking status: Never Smoker  . Smokeless tobacco: Never Used  Substance Use Topics  . Alcohol use: No  . Drug use: No     Allergies   Patient has no known allergies.   Review of Systems Review of Systems  All other  systems reviewed and are negative.   Physical Exam Updated Vital Signs BP (!) 131/93   Pulse 85   Temp 99.7 F (37.6 C) (Oral)   Wt 63.5 kg   LMP 10/03/2018   SpO2 98%   BMI 25.61 kg/m   Physical Exam Vitals signs and nursing note reviewed.  Constitutional:      Appearance: She is well-developed.  HENT:     Head: Normocephalic and atraumatic.     Comments: Oropharynx clear no erythema Eyes:     General: No scleral icterus.       Right eye: No discharge.        Left eye: No discharge.     Conjunctiva/sclera: Conjunctivae normal.     Pupils: Pupils are equal, round, and reactive to light.  Neck:     Musculoskeletal: Normal range of motion.     Vascular: No JVD.     Trachea: No tracheal deviation.  Pulmonary:     Effort: Pulmonary effort is normal. No respiratory distress.     Breath sounds: No stridor. No wheezing, rhonchi or rales.  Neurological:     Mental Status: She is  alert and oriented to person, place, and time.     Coordination: Coordination normal.  Psychiatric:        Behavior: Behavior normal.        Thought Content: Thought content normal.        Judgment: Judgment normal.      ED Treatments / Results  Labs (all labs ordered are listed, but only abnormal results are displayed) Labs Reviewed - No data to display  EKG None  Radiology No results found.  Procedures Procedures (including critical care time)  Medications Ordered in ED Medications - No data to display   Initial Impression / Assessment and Plan / ED Course  I have reviewed the triage vital signs and the nursing notes.  Pertinent labs & imaging results that were available during my care of the patient were reviewed by me and considered in my medical decision making (see chart for details).        Labs:   Imaging:  Consults:  Therapeutics:  Discharge Meds:   Assessment/Plan: 28 year old female presents today with complaints of likely viral URI.  Low suspicion for Valenza.  Discharged with symptomatic care and strict return precautions.  She verbalized understanding and agreement to today's plan.      Final Clinical Impressions(s) / ED Diagnoses   Final diagnoses:  Viral URI    ED Discharge Orders         Ordered    benzonatate (TESSALON) 100 MG capsule  Every 8 hours     10/20/18 1119           Eyvonne Mechanic, PA-C 10/20/18 1119    Arby Barrette, MD 10/21/18 1347

## 2018-11-25 ENCOUNTER — Encounter: Payer: Self-pay | Admitting: *Deleted

## 2019-04-27 ENCOUNTER — Other Ambulatory Visit: Payer: Self-pay

## 2019-04-27 ENCOUNTER — Encounter (HOSPITAL_COMMUNITY): Payer: Self-pay | Admitting: Emergency Medicine

## 2019-04-27 ENCOUNTER — Emergency Department (HOSPITAL_COMMUNITY)
Admission: EM | Admit: 2019-04-27 | Discharge: 2019-04-27 | Disposition: A | Discharge status: Home / Self Care | Payer: Self-pay | Attending: Emergency Medicine | Admitting: Emergency Medicine

## 2019-04-27 DIAGNOSIS — Z79899 Other long term (current) drug therapy: Secondary | ICD-10-CM | POA: Insufficient documentation

## 2019-04-27 DIAGNOSIS — K029 Dental caries, unspecified: Secondary | ICD-10-CM | POA: Insufficient documentation

## 2019-04-27 DIAGNOSIS — K0889 Other specified disorders of teeth and supporting structures: Secondary | ICD-10-CM | POA: Insufficient documentation

## 2019-04-27 MED ORDER — PENICILLIN V POTASSIUM 500 MG PO TABS: 500.0000 mg | ORAL_TABLET | Freq: Four times a day (QID) | ORAL | 0 refills | Status: AC

## 2019-04-27 MED ORDER — OXYCODONE-ACETAMINOPHEN 5-325 MG PO TABS: 1.0000 | ORAL_TABLET | Freq: Four times a day (QID) | ORAL | 0 refills | Status: DC | PRN

## 2019-04-27 MED ORDER — FLUCONAZOLE 150 MG PO TABS: 150.0000 mg | ORAL_TABLET | Freq: Every day | ORAL | 0 refills | Status: AC

## 2019-04-27 NOTE — ED Notes (Signed)
Discharge instructions discussed with pt. Pt verbalized understanding with no questions at this time.  

## 2019-04-27 NOTE — ED Provider Notes (Signed)
[facility] Provider Note   CSN: [id number] Arrival date & time: 04/27/19  1922     History   Chief Complaint Chief Complaint  Patient presents with  . Dental Pain    HPI Alma FriendlyKierra D Cataldi is a 28 y.o. female.     HPI   [age] YOF with complaint of dental pain. Several day history of right upper posterior dental pain. No trauma but does note some tooth decay. No fever or obvious swelling. She has not seen a dentist for this. 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 No fever or decreased ROM.  Past Medical History:  Diagnosis Date  . Anemia   . Chlamydia   . Headache   . Infection    UTI  . Ovarian cyst   . Preterm labor     Patient Active Problem List   Diagnosis Date Noted  . Hx successful VBAC (vaginal birth after cesarean), currently pregnant 01/09/2016  . Hx of fetal anomaly in prior pregnancy, currently pregnant 01/09/2016    Past Surgical History:  Procedure Laterality Date  . CESAREAN SECTION    . CESAREAN SECTION MULTI-GESTATIONAL  06/14/2016   Procedure: CESAREAN SECTION MULTI-GESTATIONAL;  Surgeon: Catalina AntiguaPeggy Constant, MD;  [facility];  Service: Obstetrics;;  . INDUCED ABORTION       OB History    Gravida  7   Para  4   Term  2   Preterm  2   AB  3   Living  5     SAB  1   TAB  2   Ectopic  0   Multiple  1   Live Births  5        Obstetric Comments  2017 del c/s for twins         Home Medications    Prior to Admission medications   Medication Sig Start Date End Date Taking? Authorizing Provider  benzonatate (TESSALON) 100 MG capsule Take 1 capsule (100 mg total) by mouth every 8 (eight) hours. 10/20/18   Shuaib Corsino, Tinnie GensJeffrey, PA-C  ibuprofen (ADVIL,MOTRIN) 600 MG tablet Take 1 tablet (600 mg total) by mouth every 6 (six) hours as needed. 05/08/17   Elson AreasSofia, Leslie K, PA-C  methocarbamol (ROBAXIN) 500 MG tablet Take 1 tablet (500 mg total) by mouth 2 (two)  times daily. 03/20/18   Harlene SaltsMorelli, Brandon A, PA-C  oxyCODONE 10 MG TABS Take 1 tablet (10 mg total) by mouth every 4 (four) hours as needed (pain scale > 7). 06/16/16   Montez MoritaLawson, Marie D, CNM  oxyCODONE-acetaminophen (PERCOCET/ROXICET) 5-325 MG tablet Take 1 tablet by mouth every 6 (six) hours as needed. 04/27/19   Anica Alcaraz, Tinnie GensJeffrey, PA-C  penicillin v potassium (VEETID) 500 MG tablet Take 1 tablet (500 mg total) by mouth 4 (four) times daily for 10 days. 04/27/19 05/07/19  Eyvonne MechanicHedges, Meosha Castanon, PA-C  Prenatal Vit-Fe Fumarate-FA (PRENATAL MULTIVITAMIN) TABS tablet Take 1 tablet by mouth daily.     [provider]  ranitidine (ZANTAC) 150 MG tablet Take 1 tablet (150 mg total) by mouth 2 (two) times daily. 05/20/16   Lorne SkeensSchenk, Nicholas Michael, MD    Family History Family History  Problem Relation Age of Onset  . Asthma Brother   . Asthma Daughter   . Asthma Son     Social History Social History   Tobacco Use  . Smoking status: Never Smoker  . Smokeless tobacco: Never Used  Substance Use Topics  . Alcohol use:  No  . Drug use: No     Allergies   Patient has no known allergies.   Review of Systems Review of Systems  All other systems reviewed and are negative.    Physical Exam Updated Vital Signs BP (!) 126/100   Pulse 63   Temp 98.7 F (37.1 C) (Oral)   Resp 17   SpO2 100%   Physical Exam Vitals signs and nursing note reviewed.  Constitutional:      Appearance: She is well-developed.  HENT:     Head: Normocephalic and atraumatic.     Comments: Gumline palpated with no swelling or edema- floor of the mouth soft, neck supple Eyes:     General: No scleral icterus.       Right eye: No discharge.        Left eye: No discharge.     Conjunctiva/sclera: Conjunctivae normal.     Pupils: Pupils are equal, round, and reactive to light.  Neck:     Musculoskeletal: Normal range of motion.     Vascular: No JVD.     Trachea: No tracheal deviation.  Pulmonary:     Effort:  Pulmonary effort is normal.     Breath sounds: No stridor.  Musculoskeletal:     Comments: Right shoulder atraumatic with full ROM, no swelling or warmth- distal PMS intact  Neurological:     Mental Status: She is alert and oriented to person, place, and time.     Coordination: Coordination normal.  Psychiatric:        Behavior: Behavior normal.        Thought Content: Thought content normal.        Judgment: Judgment normal.      ED Treatments / Results  Labs (all labs ordered are listed, but only abnormal results are displayed) Labs Reviewed - No data to display  EKG None  Radiology No results found.  Procedures Procedures (including critical care time)  Medications Ordered in ED Medications - No data to display   Initial Impression / Assessment and Plan / ED Course  I have reviewed the triage vital signs and the nursing notes.  Pertinent labs & imaging results that were available during my care of the patient were reviewed by me and considered in my medical decision making (see chart for details).        Pt here with dental pain, no obvious abscess will cover for potential infection. Pain medication and cautions given. Follow up with dentist. Return precautions given. Follow up with ortho for shoulder.   Final Clinical Impressions(s) / ED Diagnoses   Final diagnoses:  Pain, dental    ED Discharge Orders         Ordered    penicillin v potassium (VEETID) 500 MG tablet  4 times daily     04/27/19 2154    oxyCODONE-acetaminophen (PERCOCET/ROXICET) 5-325 MG tablet  Every 6 hours PRN     04/27/19 2154    fluconazole (DIFLUCAN) 150 MG tablet  Daily     04/27/19 2154           Okey Regal, PA-C 05/01/19 Milton, Dan, DO 05/01/19 1231

## 2019-04-27 NOTE — ED Triage Notes (Signed)
C/O of dental pain on right side. Pt states she recently got insurance and has not found a dentist yet. Also would like her right shoulder to be examined, stating that it dislocates regularly.

## 2019-04-27 NOTE — Discharge Instructions (Signed)
Please read attached information. If you experience any new or worsening signs or symptoms please return to the emergency room for evaluation. Please follow-up with your primary care provider or specialist as discussed. Please use medication prescribed only as directed and discontinue taking if you have any concerning signs or symptoms.   °

## 2019-08-27 ENCOUNTER — Encounter (HOSPITAL_COMMUNITY): Payer: Self-pay

## 2019-08-27 ENCOUNTER — Ambulatory Visit (HOSPITAL_COMMUNITY)
Admission: EM | Admit: 2019-08-27 | Discharge: 2019-08-27 | Disposition: A | Discharge status: Home / Self Care | Payer: Self-pay | Attending: Family Medicine | Admitting: Family Medicine

## 2019-08-27 DIAGNOSIS — Z3202 Encounter for pregnancy test, result negative: Secondary | ICD-10-CM

## 2019-08-27 DIAGNOSIS — N390 Urinary tract infection, site not specified: Secondary | ICD-10-CM | POA: Insufficient documentation

## 2019-08-27 LAB — POCT URINALYSIS DIP (DEVICE)
Bilirubin Urine: NEGATIVE
Glucose, UA: NEGATIVE mg/dL
Hgb urine dipstick: NEGATIVE
Ketones, ur: NEGATIVE mg/dL
Leukocytes,Ua: NEGATIVE
Nitrite: POSITIVE — AB
Protein, ur: NEGATIVE mg/dL
Specific Gravity, Urine: >=1.03 (ref 1.005–1.030)
Urobilinogen, UA: 1 mg/dL (ref 0.0–1.0)
pH: 6 (ref 5.0–8.0)

## 2019-08-27 LAB — POC URINE PREG, ED
Preg Test, Ur: NEGATIVE
Preg Test, Ur: NEGATIVE

## 2019-08-27 LAB — POCT PREGNANCY, URINE: Preg Test, Ur: NEGATIVE

## 2019-08-27 MED ORDER — NITROFURANTOIN MONOHYD MACRO 100 MG PO CAPS: 100.0000 mg | ORAL_CAPSULE | Freq: Two times a day (BID) | ORAL | 0 refills | Status: AC

## 2019-08-27 NOTE — Discharge Instructions (Addendum)
Your urine is consistent with uti.  Complete course of antibiotics.  Drink plenty of water to empty bladder regularly. Avoid alcohol and caffeine as these may irritate the bladder.   Will notify you of any positive findings from your vaginal swab and if any changes to treatment are needed.   You may monitor your results on your MyChart online as well.

## 2019-08-27 NOTE — ED Provider Notes (Signed)
[place]    CSN: [id number] Arrival date & time: 08/27/19  1212      History   Chief Complaint Chief Complaint  Patient presents with  . Urinary Frequency    HPI Maria Olson is a 29 y.o. female.   [name] presents with complaints of pain and frequency of urination which started approximately 2 days ago. Some pelvic cramping and back pain, denies either of these currently. No fevers. No blood to urine. No vaginal bleeding. LMP 12/20 and it was normal for her. She is not on birth control. Sexually active with 1 partner, doesn't use condoms. Denies known concern for std but would like screening as has had in the past. Denies any vaginal discharge but states she feels "irritation" to the vagina. No vaginal bleeding. Has had UTI's in the past which have felt similar.     ROS per HPI, negative if not otherwise mentioned.      Past Medical History:  Diagnosis Date  . Anemia   . Chlamydia   . Headache   . Infection    UTI  . Ovarian cyst   . Preterm labor     Patient Active Problem List   Diagnosis Date Noted  . Hx successful VBAC (vaginal birth after cesarean), currently pregnant 01/09/2016  . Hx of fetal anomaly in prior pregnancy, currently pregnant 01/09/2016    Past Surgical History:  Procedure Laterality Date  . CESAREAN SECTION    . CESAREAN SECTION MULTI-GESTATIONAL  06/14/2016   Procedure: CESAREAN SECTION MULTI-GESTATIONAL;  Surgeon: Mora Bellman, MD;  Location: [facility];  Service: Obstetrics;;  . INDUCED ABORTION      OB History    Gravida  7   Para  4   Term  2   Preterm  2   AB  3   Living  5     SAB  1   TAB  2   Ectopic  0   Multiple  1   Live Births  5        Obstetric Comments  2017 del c/s for twins         Home Medications    Prior to Admission medications   Medication Sig Start Date End Date Taking? Authorizing Provider  benzonatate (TESSALON) 100 MG capsule Take 1 capsule  (100 mg total) by mouth every 8 (eight) hours. 10/20/18   Hedges, Dellis Filbert, PA-C  ibuprofen (ADVIL,MOTRIN) 600 MG tablet Take 1 tablet (600 mg total) by mouth every 6 (six) hours as needed. 05/08/17   Fransico Meadow, PA-C  methocarbamol (ROBAXIN) 500 MG tablet Take 1 tablet (500 mg total) by mouth 2 (two) times daily. 03/20/18   Nuala Alpha A, PA-C  oxyCODONE 10 MG TABS Take 1 tablet (10 mg total) by mouth every 4 (four) hours as needed (pain scale > 7). 06/16/16   Keitha Butte, CNM  oxyCODONE-acetaminophen (PERCOCET/ROXICET) 5-325 MG tablet Take 1 tablet by mouth every 6 (six) hours as needed. 04/27/19   Hedges, Dellis Filbert, PA-C  Prenatal Vit-Fe Fumarate-FA (PRENATAL MULTIVITAMIN) TABS tablet Take 1 tablet by mouth daily.     [provider]  ranitidine (ZANTAC) 150 MG tablet Take 1 tablet (150 mg total) by mouth 2 (two) times daily. 05/20/16   Waldemar Dickens, MD    Family History Family History  Problem Relation Age of Onset  . Asthma Brother   . Asthma Daughter   . Asthma Son     Social  History Social History   Tobacco Use  . Smoking status: Never Smoker  . Smokeless tobacco: Never Used  Substance Use Topics  . Alcohol use: No  . Drug use: No     Allergies   Patient has no known allergies.   Review of Systems Review of Systems   Physical Exam Triage Vital Signs ED Triage Vitals  Enc Vitals Group     BP 08/27/19 1252 (!) 121/55     Pulse Rate 08/27/19 1252 70     Resp 08/27/19 1252 16     Temp 08/27/19 1252 98.5 F (36.9 C)     Temp Source 08/27/19 1252 Oral     SpO2 08/27/19 1252 99 %     Weight --      Height --      Head Circumference --      Peak Flow --      Pain Score 08/27/19 1255 0     Pain Loc --      Pain Edu? --      Excl. in [place]? --    No data found.  Updated Vital Signs BP (!) 121/55 (BP Location: Left Arm)   Pulse 70   Temp 98.5 F (36.9 C) (Oral)   Resp 16   LMP 09/07/2018   SpO2 99%   Physical Exam Constitutional:        General: She is not in acute distress.    Appearance: She is well-developed.  Cardiovascular:     Rate and Rhythm: Normal rate.  Pulmonary:     Effort: Pulmonary effort is normal.  Abdominal:     Palpations: Abdomen is not rigid.     Tenderness: There is no abdominal tenderness. There is no guarding or rebound.  Genitourinary:    Comments: Denies sores, lesions, vaginal bleeding; no pelvic pain; gu exam deferred at this time, vaginal self swab collected.   Skin:    General: Skin is warm and dry.  Neurological:     Mental Status: She is alert and oriented to person, place, and time.      UC Treatments / Results  Labs (all labs ordered are listed, but only abnormal results are displayed) Labs Reviewed  POCT URINALYSIS DIP (DEVICE) - Abnormal; Notable for the following components:      Result Value   Nitrite POSITIVE (*)    All other components within normal limits  URINE CULTURE  POC URINE PREG, ED  CERVICOVAGINAL ANCILLARY ONLY    EKG   Radiology No results found.  Procedures Procedures (including critical care time)  Medications Ordered in UC Medications - No data to display  Initial Impression / Assessment and Plan / UC Course  I have reviewed the triage vital signs and the nursing notes.  Pertinent labs & imaging results that were available during my care of the patient were reviewed by me and considered in my medical decision making (see chart for details).     Urinary frequency and burning with nitrite to UA. macrobid initiated with culture pending. Vaginal cytology pending. Will notify of any positive findings and if any changes to treatment are needed.  Return precautions provided. Patient verbalized understanding and agreeable to plan.   Final Clinical Impressions(s) / UC Diagnoses   Final diagnoses:  Lower urinary tract infectious disease     Discharge Instructions     Your urine is consistent with uti.  Complete course of antibiotics.   Drink plenty of water to empty bladder regularly. Avoid alcohol  and caffeine as these may irritate the bladder.   Will notify you of any positive findings from your vaginal swab and if any changes to treatment are needed.   You may monitor your results on your MyChart online as well.     ED Prescriptions    None     PDMP not reviewed this encounter.   Georgetta Haber, NP 08/27/19 1318

## 2019-08-27 NOTE — ED Triage Notes (Signed)
Pt present urinary frequency with some tingling while urinating. Pt states that symptoms started 3 days.

## 2019-08-29 LAB — URINE CULTURE: Culture: >=100000 — AB

## 2019-08-31 ENCOUNTER — Telehealth (HOSPITAL_COMMUNITY): Payer: Self-pay | Admitting: Emergency Medicine

## 2019-08-31 LAB — CERVICOVAGINAL ANCILLARY ONLY
Bacterial vaginitis: POSITIVE — AB
Candida vaginitis: NEGATIVE
Chlamydia: NEGATIVE
Neisseria Gonorrhea: NEGATIVE
Trichomonas: NEGATIVE

## 2019-08-31 MED ORDER — METRONIDAZOLE 500 MG PO TABS: 500.0000 mg | ORAL_TABLET | Freq: Two times a day (BID) | ORAL | 0 refills | Status: AC

## 2019-08-31 NOTE — Telephone Encounter (Signed)
Bacterial vaginosis is positive. Pt needs treatment. Flagyl 500 mg BID x 7 days #14 no refills sent to patients pharmacy of choice.    Patient contacted by phone and made aware of    results. Pt verbalized understanding and had all questions answered.    

## 2019-09-24 ENCOUNTER — Emergency Department (HOSPITAL_COMMUNITY)
Admission: EM | Admit: 2019-09-24 | Discharge: 2019-09-24 | Disposition: A | Discharge status: Home / Self Care | Payer: Self-pay | Attending: Emergency Medicine | Admitting: Emergency Medicine

## 2019-09-24 ENCOUNTER — Other Ambulatory Visit: Payer: Self-pay

## 2019-09-24 DIAGNOSIS — Z79899 Other long term (current) drug therapy: Secondary | ICD-10-CM | POA: Insufficient documentation

## 2019-09-24 DIAGNOSIS — F329 Major depressive disorder, single episode, unspecified: Secondary | ICD-10-CM | POA: Insufficient documentation

## 2019-09-24 DIAGNOSIS — F32A Depression, unspecified: Secondary | ICD-10-CM

## 2019-09-24 DIAGNOSIS — F410 Panic disorder [episodic paroxysmal anxiety] without agoraphobia: Secondary | ICD-10-CM | POA: Insufficient documentation

## 2019-09-24 NOTE — BH Assessment (Signed)
[facility] Assessment Progress Note Patient presents this date requesting information and resources for ongoing anxiety. Patient declines any TTS assessment and reports she is "just needing information" to assist with current needs. Patient cannot identify any specific stressor or event that triggered her symptoms. Patient reports ongoing stress for the last month associated with managing her five children and finances. Patient cannot identify any specific symptoms stating she "just gets overwhelmed at times" and needs better time management skills along with counseling.  Patient denies any S/I, H/I or AVH. Patient denies any prior attempts or gestures at self harm. Patient reports that she received counseling in the past at age 29 for issues associated with family conflict although denies any current mental health diagnosis. Patient was seen by this Clinical research associate who discussed current services available in the area to include [facility]. Patient states she is "just interested in counseling" and reports she is not interested in any current mediation interventions. Patient denies any thoughts of self harm and contracts for safety. Patient was provided resources and contact information prior to discharge.

## 2019-09-24 NOTE — ED Triage Notes (Addendum)
Patient states she is here to be seen for anxiety/depression. States no specific event has occurred, patient says she was on medications in high school for same issue but has not taken any in a long time. Patient denies SI today, but says she has had these thoughts in the past and wanted to be seen today before it got to that point. Patient present crying in triage.

## 2019-09-24 NOTE — ED Provider Notes (Signed)
[facility]-EMERGENCY DEPT Provider Note   CSN: [id number] Arrival date & time: 09/24/19  0102     History Chief Complaint  Patient presents with  . Depression  . Panic Attack    Maria Olson is a 29 y.o. female.  29 year old female presents with worsening anxiety and depression times several days.  Patient states that she has no suicidal or homicidal ideations.  Patient states that she has also stress at home.  Denies any insomnia but does note anorexia.  No prior diagnosed psychiatric history.  Denies any use of illicit drugs or alcohol.  No hallucinations.  States that she has not been responding to internal stimuli.  Nothing makes her symptoms better no treatment use prior to arrival.        Past Medical History:  Diagnosis Date  . Anemia   . Chlamydia   . Headache   . Infection    UTI  . Ovarian cyst   . Preterm labor     Patient Active Problem List   Diagnosis Date Noted  . Hx successful VBAC (vaginal birth after cesarean), currently pregnant 01/09/2016  . Hx of fetal anomaly in prior pregnancy, currently pregnant 01/09/2016    Past Surgical History:  Procedure Laterality Date  . CESAREAN SECTION    . CESAREAN SECTION MULTI-GESTATIONAL  06/14/2016   Procedure: CESAREAN SECTION MULTI-GESTATIONAL;  Surgeon: Catalina Antigua, MD;  [facility];  Service: Obstetrics;;  . INDUCED ABORTION       OB History    Gravida  7   Para  4   Term  2   Preterm  2   AB  3   Living  5     SAB  1   TAB  2   Ectopic  0   Multiple  1   Live Births  5        Obstetric Comments  2017 del c/s for twins        Family History  Problem Relation Age of Onset  . Asthma Brother   . Asthma Daughter   . Asthma Son     Social History   Tobacco Use  . Smoking status: Never Smoker  . Smokeless tobacco: Never Used  Substance Use Topics  . Alcohol use: No  . Drug use: No    Home Medications Prior to Admission  medications   Medication Sig Start Date End Date Taking? Authorizing Provider  benzonatate (TESSALON) 100 MG capsule Take 1 capsule (100 mg total) by mouth every 8 (eight) hours. 10/20/18   Hedges, Tinnie Gens, PA-C  ibuprofen (ADVIL,MOTRIN) 600 MG tablet Take 1 tablet (600 mg total) by mouth every 6 (six) hours as needed. 05/08/17   Elson Areas, PA-C  methocarbamol (ROBAXIN) 500 MG tablet Take 1 tablet (500 mg total) by mouth 2 (two) times daily. 03/20/18   Harlene Salts A, PA-C  oxyCODONE 10 MG TABS Take 1 tablet (10 mg total) by mouth every 4 (four) hours as needed (pain scale > 7). 06/16/16   Montez Morita, CNM  oxyCODONE-acetaminophen (PERCOCET/ROXICET) 5-325 MG tablet Take 1 tablet by mouth every 6 (six) hours as needed. 04/27/19   Hedges, Tinnie Gens, PA-C  Prenatal Vit-Fe Fumarate-FA (PRENATAL MULTIVITAMIN) TABS tablet Take 1 tablet by mouth daily.     [provider]  ranitidine (ZANTAC) 150 MG tablet Take 1 tablet (150 mg total) by mouth 2 (two) times daily. 05/20/16   Lorne Skeens, MD    Allergies  Patient has no known allergies.  Review of Systems   Review of Systems  All other systems reviewed and are negative.   Physical Exam Updated Vital Signs BP 122/85 (BP Location: Left Arm)   Pulse 68   Temp 98.4 F (36.9 C) (Oral)   Resp 16   Ht 1.6 m (5\' 3" )   Wt 63.5 kg   LMP 08/07/2019   SpO2 98%   BMI 24.80 kg/m   Physical Exam Vitals and nursing note reviewed.  Constitutional:      General: She is not in acute distress.    Appearance: Normal appearance. She is well-developed. She is not toxic-appearing.  HENT:     Head: Normocephalic and atraumatic.  Eyes:     General: Lids are normal.     Conjunctiva/sclera: Conjunctivae normal.     Pupils: Pupils are equal, round, and reactive to light.  Neck:     Thyroid: No thyroid mass.     Trachea: No tracheal deviation.  Cardiovascular:     Rate and Rhythm: Normal rate and regular rhythm.     Heart  sounds: Normal heart sounds. No murmur. No gallop.   Pulmonary:     Effort: Pulmonary effort is normal. No respiratory distress.     Breath sounds: Normal breath sounds. No stridor. No decreased breath sounds, wheezing, rhonchi or rales.  Abdominal:     General: Bowel sounds are normal. There is no distension.     Palpations: Abdomen is soft.     Tenderness: There is no abdominal tenderness. There is no rebound.  Musculoskeletal:        General: No tenderness. Normal range of motion.     Cervical back: Normal range of motion and neck supple.  Skin:    General: Skin is warm and dry.     Findings: No abrasion or rash.  Neurological:     Mental Status: She is alert and oriented to person, place, and time.     GCS: GCS eye subscore is 4. GCS verbal subscore is 5. GCS motor subscore is 6.     Cranial Nerves: No cranial nerve deficit.     Sensory: No sensory deficit.  Psychiatric:        Attention and Perception: Attention normal.        Mood and Affect: Mood is depressed. Affect is flat.        Speech: Speech normal.        Behavior: Behavior normal.        Thought Content: Thought content does not include homicidal or suicidal ideation. Thought content does not include homicidal or suicidal plan.     ED Results / Procedures / Treatments   Labs (all labs ordered are listed, but only abnormal results are displayed) Labs Reviewed - No data to display  EKG None  Radiology No results found.  Procedures Procedures (including critical care time)  Medications Ordered in ED Medications - No data to display  ED Course  I have reviewed the triage vital signs and the nursing notes.  Pertinent labs & imaging results that were available during my care of the patient were reviewed by me and considered in my medical decision making (see chart for details).    MDM Rules/Calculators/A&P                     Patient states that she does not want to be admitted for inpatient psychiatric  care.  She would still like to speak  with a Veterinary surgeon.  TTS consult  10:38 AM Patient seen by TTS has been cleared for discharge Final Clinical Impression(s) / ED Diagnoses Final diagnoses:  None    Rx / DC Orders ED Discharge Orders    None       Lorre Nick, MD 09/24/19 1039

## 2020-01-22 ENCOUNTER — Ambulatory Visit: Payer: Self-pay | Attending: Internal Medicine

## 2020-02-28 ENCOUNTER — Ambulatory Visit: Payer: Self-pay | Attending: Internal Medicine

## 2020-09-01 ENCOUNTER — Other Ambulatory Visit: Payer: Self-pay

## 2021-01-11 ENCOUNTER — Encounter: Payer: Self-pay | Admitting: *Deleted

## 2021-01-31 ENCOUNTER — Encounter (HOSPITAL_COMMUNITY): Payer: Self-pay

## 2021-01-31 ENCOUNTER — Inpatient Hospital Stay (HOSPITAL_COMMUNITY)
Admission: AD | Admit: 2021-01-31 | Discharge: 2021-01-31 | Disposition: A | Discharge status: Home / Self Care | Payer: Self-pay | Attending: Obstetrics and Gynecology | Admitting: Obstetrics and Gynecology

## 2021-01-31 DIAGNOSIS — Z3202 Encounter for pregnancy test, result negative: Secondary | ICD-10-CM

## 2021-01-31 DIAGNOSIS — Z9851 Tubal ligation status: Secondary | ICD-10-CM

## 2021-01-31 DIAGNOSIS — R109 Unspecified abdominal pain: Secondary | ICD-10-CM | POA: Insufficient documentation

## 2021-01-31 DIAGNOSIS — M545 Low back pain, unspecified: Secondary | ICD-10-CM | POA: Insufficient documentation

## 2021-01-31 LAB — POCT PREGNANCY, URINE: Preg Test, Ur: NEGATIVE

## 2021-01-31 NOTE — Discharge Instructions (Signed)
Prenatal Care Providers           [facility]  [address] [phone]  [facility]   [address]  [phone]  [facility]       [address] [phone]            [facility] @ [facility]     [address] [phone]          [facility]   [address] [phone]  [facility]  [address] [phone]     [facility]  [address]   [phone]     [facility]  Phone: [phone]   

## 2021-01-31 NOTE — MAU Provider Note (Signed)
Event Date/Time   First Provider Initiated Contact with Patient 01/31/21 1339      S Ms. Maria Olson is a 30 y.o. [id number] patient who presents to MAU today with complaint of suprapubic cramping and low back pain. These are new problems, onset Sunday 01/28/21. Patient believes she has a UTI and is concerned about STI exposure.  Patient has sought care at multiple sites this week including Urgent Care, [facility] and [facility] but states the wait time precluded her from staying for management.   Patient is s/ tubal ligation during her cesarean 05/2016. She does not believe she is pregnant.   O BP 116/74 (BP Location: Right Arm)   Pulse 69   Temp 98.7 F (37.1 C) (Oral)   Resp 15   Ht 5\' 3"  (1.6 m)   Wt 79.2 kg   LMP 01/12/2021   SpO2 99% Comment: room air  BMI 30.93 kg/m    Physical Exam Vitals and nursing note reviewed. Exam conducted with a chaperone present.  Constitutional:      General: She is not in acute distress.    Appearance: She is not ill-appearing or toxic-appearing.  Cardiovascular:     Rate and Rhythm: Normal rate.     Pulses: Normal pulses.  Pulmonary:     Effort: Pulmonary effort is normal.  Skin:    Capillary Refill: Capillary refill takes less than 2 seconds.  Neurological:     Mental Status: She is alert and oriented to person, place, and time.  Psychiatric:        Mood and Affect: Mood normal.        Behavior: Behavior normal.    A Medical screening exam complete S/p BTL Negative urine pregnancy test  P Discharge from MAU in stable condition Patient given the option of transfer to [facility] for further evaluation or seek care in outpatient facility of choice  Warning signs for worsening condition that would warrant emergency follow-up discussed   F/U: Patient verbalizes preference to follow up in office Discussed possibility of RN visit  Patient assisted with putting [facility] address in her phone, confirmed patient has phone  number   [facility], [place] Cantor 01/31/2021 2:54 PM

## 2021-01-31 NOTE — MAU Note (Signed)
Pt is having abdominal cramping and back contractions since Sunday. Also having pink blood when wiping. LMP was 01/12/2021. Had tubal ligation 5 years ago. Denies any vaginal discharge.

## 2021-10-02 ENCOUNTER — Encounter (HOSPITAL_COMMUNITY): Payer: Self-pay | Admitting: Emergency Medicine

## 2021-10-02 ENCOUNTER — Other Ambulatory Visit: Payer: Self-pay

## 2021-10-02 ENCOUNTER — Ambulatory Visit (HOSPITAL_COMMUNITY)
Admission: EM | Admit: 2021-10-02 | Discharge: 2021-10-02 | Disposition: A | Discharge status: Home / Self Care | Payer: Self-pay | Attending: Family Medicine | Admitting: Family Medicine

## 2021-10-02 DIAGNOSIS — R300 Dysuria: Secondary | ICD-10-CM | POA: Insufficient documentation

## 2021-10-02 LAB — POCT URINALYSIS DIPSTICK, ED / UC
Bilirubin Urine: NEGATIVE
Glucose, UA: NEGATIVE mg/dL
Hgb urine dipstick: NEGATIVE
Ketones, ur: NEGATIVE mg/dL
Nitrite: NEGATIVE
Protein, ur: NEGATIVE mg/dL
Specific Gravity, Urine: 1.025 (ref 1.005–1.030)
Urobilinogen, UA: 1 mg/dL (ref 0.0–1.0)
pH: 6.5 (ref 5.0–8.0)

## 2021-10-02 LAB — POC URINE PREG, ED: Preg Test, Ur: NEGATIVE

## 2021-10-02 NOTE — Discharge Instructions (Addendum)
Your urinalysis showed a few white blood cells; a culture will be sent to verify you do not have a bladder infection  We will call you if any of the test for infections are positive, and need treatment.  Try cool compresses on the affected area

## 2021-10-02 NOTE — ED Triage Notes (Signed)
Suspected uti for 2 weeks.  Has complaints of low back pain.  Has tingling sensation to perineum over the past 2 days.  Denies itching, no vaginal discharge.  Patient does not identify burning, urgency or frequency, but related back pain to uti

## 2021-10-02 NOTE — ED Provider Notes (Addendum)
[place]    CSN: [id number] Arrival date & time: 10/02/21  0930      History   Chief Complaint Chief Complaint  Patient presents with   Urinary Tract Infection    HPI Maria Olson is a 31 y.o. female.    Urinary Tract Infection Here for a constant burning around her vaginal opening in the 3 days or so. This began after using a new soap with some peppermint  Did have some lower back pain at first. Just finished her period.  No itching. No abdominal pain  Past Medical History:  Diagnosis Date   Anemia    Chlamydia    Headache    Infection    UTI   Ovarian cyst    Preterm labor     Patient Active Problem List   Diagnosis Date Noted   Hx successful VBAC (vaginal birth after cesarean), currently pregnant 01/09/2016   Hx of fetal anomaly in prior pregnancy, currently pregnant 01/09/2016    Past Surgical History:  Procedure Laterality Date   CESAREAN SECTION     CESAREAN SECTION MULTI-GESTATIONAL  06/14/2016   Procedure: CESAREAN SECTION MULTI-GESTATIONAL;  Surgeon: Mora Bellman, MD;  Location: [facility];  Service: Obstetrics;;   INDUCED ABORTION      OB History     Gravida  7   Para  4   Term  2   Preterm  2   AB  3   Living  5      SAB  1   IAB  2   Ectopic  0   Multiple  1   Live Births  5        Obstetric Comments  2017 del c/s for twins          Home Medications    Prior to Admission medications   Not on File    Family History Family History  Problem Relation Age of Onset   Healthy Mother    Asthma Brother    Asthma Daughter    Asthma Son     Social History Social History   Tobacco Use   Smoking status: Never   Smokeless tobacco: Never  Vaping Use   Vaping Use: Never used  Substance Use Topics   Alcohol use: No   Drug use: No     Allergies   Patient has no known allergies.   Review of Systems Review of Systems   Physical Exam Triage Vital Signs ED Triage Vitals   Enc Vitals Group     BP 10/02/21 1059 129/84     Pulse Rate 10/02/21 1059 64     Resp 10/02/21 1059 18     Temp 10/02/21 1059 98.3 F (36.8 C)     Temp Source 10/02/21 1059 Oral     SpO2 10/02/21 1059 97 %     Weight --      Height --      Head Circumference --      Peak Flow --      Pain Score 10/02/21 1056 4     Pain Loc --      Pain Edu? --      Excl. in [place]? --    No data found.  Updated Vital Signs BP 129/84 (BP Location: Right Arm)    Pulse 64    Temp 98.3 F (36.8 C) (Oral)    Resp 18    LMP 09/25/2021 (Approximate)    SpO2 97%   Visual  Acuity Right Eye Distance:   Left Eye Distance:   Bilateral Distance:    Right Eye Near:   Left Eye Near:    Bilateral Near:     Physical Exam Vitals reviewed.  Constitutional:      General: She is not in acute distress. Genitourinary:    General: Normal vulva.     Vagina: No vaginal discharge.     Comments: No ulcerative/vesicular lesions. No erythema Neurological:     Mental Status: She is alert and oriented to person, place, and time.  Psychiatric:        Behavior: Behavior normal.     UC Treatments / Results  Labs (all labs ordered are listed, but only abnormal results are displayed) Labs Reviewed  POCT URINALYSIS DIPSTICK, ED / UC - Abnormal; Notable for the following components:      Result Value   Leukocytes,Ua SMALL (*)    All other components within normal limits  URINE CULTURE  POC URINE PREG, ED  CERVICOVAGINAL ANCILLARY ONLY    EKG   Radiology No results found.  Procedures Procedures (including critical care time)  Medications Ordered in UC Medications - No data to display  Initial Impression / Assessment and Plan / UC Course  I have reviewed the triage vital signs and the nursing notes.  Pertinent labs & imaging results that were available during my care of the patient were reviewed by me and considered in my medical decision making (see chart for details).     Analysis shows a small  amount of white blood cells.  Will send for culture.  STD testing done today, but I did not do herpes testing as there were no lesions to test She can take tylenol as needed Final Clinical Impressions(s) / UC Diagnoses   Final diagnoses:  Dysuria     Discharge Instructions      Your urinalysis showed a few white blood cells; a culture will be sent to verify you do not have a bladder infection  We will call you if any of the test for infections are positive, and need treatment.  Try cool compresses on the affected area     ED Prescriptions   None    PDMP not reviewed this encounter.   Barrett Henle, MD 10/02/21 1215    Barrett Henle, MD 10/02/21 [phone]

## 2021-10-03 ENCOUNTER — Telehealth (HOSPITAL_COMMUNITY): Payer: Self-pay | Admitting: Emergency Medicine

## 2021-10-03 LAB — CERVICOVAGINAL ANCILLARY ONLY
Bacterial Vaginitis (gardnerella): POSITIVE — AB
Candida Glabrata: NEGATIVE
Candida Vaginitis: NEGATIVE
Chlamydia: NEGATIVE
Comment: NEGATIVE
Comment: NEGATIVE
Comment: NEGATIVE
Comment: NEGATIVE
Comment: NEGATIVE
Comment: NORMAL
Neisseria Gonorrhea: NEGATIVE
Trichomonas: POSITIVE — AB

## 2021-10-03 MED ORDER — FLUCONAZOLE 150 MG PO TABS: 150.0000 mg | ORAL_TABLET | Freq: Once | ORAL | 0 refills | Status: AC

## 2021-10-03 MED ORDER — METRONIDAZOLE 500 MG PO TABS: 500.0000 mg | ORAL_TABLET | Freq: Two times a day (BID) | ORAL | 0 refills | Status: DC

## 2021-10-04 ENCOUNTER — Telehealth (HOSPITAL_COMMUNITY): Payer: Self-pay | Admitting: Emergency Medicine

## 2021-10-04 LAB — URINE CULTURE: Culture: 70000 — AB

## 2021-10-04 MED ORDER — NITROFURANTOIN MONOHYD MACRO 100 MG PO CAPS: 100.0000 mg | ORAL_CAPSULE | Freq: Two times a day (BID) | ORAL | 0 refills | Status: DC

## 2021-10-27 ENCOUNTER — Other Ambulatory Visit: Payer: Self-pay

## 2021-10-27 ENCOUNTER — Encounter (HOSPITAL_COMMUNITY): Payer: Self-pay | Admitting: Emergency Medicine

## 2021-10-27 ENCOUNTER — Ambulatory Visit (HOSPITAL_COMMUNITY)
Admission: EM | Admit: 2021-10-27 | Discharge: 2021-10-27 | Disposition: A | Discharge status: Home / Self Care | Payer: Self-pay | Attending: Family Medicine | Admitting: Family Medicine

## 2021-10-27 DIAGNOSIS — R202 Paresthesia of skin: Secondary | ICD-10-CM | POA: Insufficient documentation

## 2021-10-27 LAB — COMPREHENSIVE METABOLIC PANEL
ALT: 10 U/L (ref 0–44)
AST: 19 U/L (ref 15–41)
Albumin: 4 g/dL (ref 3.5–5.0)
Alkaline Phosphatase: 53 U/L (ref 38–126)
Anion gap: 11 (ref 5–15)
BUN: 9 mg/dL (ref 6–20)
CO2: 22 mmol/L (ref 22–32)
Calcium: 9.7 mg/dL (ref 8.9–10.3)
Chloride: 106 mmol/L (ref 98–111)
Creatinine, Ser: 0.86 mg/dL (ref 0.44–1.00)
GFR, Estimated: >60 mL/min (ref >60)
Glucose, Bld: 81 mg/dL (ref 70–99)
Potassium: 3.8 mmol/L (ref 3.5–5.1)
Sodium: 139 mmol/L (ref 135–145)
Total Bilirubin: 0.7 mg/dL (ref 0.3–1.2)
Total Protein: 7.1 g/dL (ref 6.5–8.1)

## 2021-10-27 LAB — CBC
HCT: 35.8 % — ABNORMAL LOW (ref 36.0–46.0)
Hemoglobin: 11.8 g/dL — ABNORMAL LOW (ref 12.0–15.0)
MCH: 29.6 pg (ref 26.0–34.0)
MCHC: 33 g/dL (ref 30.0–36.0)
MCV: 89.7 fL (ref 80.0–100.0)
Platelets: 342 10*3/uL (ref 150–400)
RBC: 3.99 MIL/uL (ref 3.87–5.11)
RDW: 12.6 % (ref 11.5–15.5)
WBC: 2.9 10*3/uL — ABNORMAL LOW (ref 4.0–10.5)
nRBC: 0 % (ref 0.0–0.2)

## 2021-10-27 LAB — CBG MONITORING, ED: Glucose-Capillary: 90 mg/dL (ref 70–99)

## 2021-10-27 LAB — TSH: TSH: 0.865 u[IU]/mL (ref 0.350–4.500)

## 2021-10-27 NOTE — Discharge Instructions (Signed)
You have had labs (blood work) drawn today. We will call you with any significant abnormalities or if there is need to begin or change treatment or pursue further follow up.  You may also review your test results online through MyChart. If you do not have a MyChart account, instructions to sign up should be on your discharge paperwork.  

## 2021-10-27 NOTE — ED Triage Notes (Addendum)
Took medicine prescribed. Patient was seen 10/02/2021.   Patient reports feeling fine after completing medication for 2-3 days.  Patient has had a sexual partner since treatment.  Reports achy, numbness, tightness in bilateral buttocks and both legs.   ? ?Patient has just started prescribed Macrobid yesterday ? ?Reports skin is burning if urine touches skin.  No pain in vagina area.   ?

## 2021-10-29 NOTE — ED Provider Notes (Signed)
?[facility] ? ? ?[id number] ?10/27/21 Arrival Time: [id number] ? ?ASSESSMENT & PLAN: ? ?1. Paresthesias   ? ?Unclear etiology of her symptoms. ?Normal neurologic exam. ? ?WBC low on CBC but this would not explain her current symptoms. RN to notify that she may return within the next few days and recheck CBC with diff. ? ?Results for orders placed or performed during the hospital encounter of 10/27/21  ?Comprehensive metabolic panel  ?Result Value Ref Range  ? Sodium 139 135 - 145 mmol/L  ? Potassium 3.8 3.5 - 5.1 mmol/L  ? Chloride 106 98 - 111 mmol/L  ? CO2 22 22 - 32 mmol/L  ? Glucose, Bld 81 70 - 99 mg/dL  ? BUN 9 6 - 20 mg/dL  ? Creatinine, Ser 0.86 0.44 - 1.00 mg/dL  ? Calcium 9.7 8.9 - 10.3 mg/dL  ? Total Protein 7.1 6.5 - 8.1 g/dL  ? Albumin 4.0 3.5 - 5.0 g/dL  ? AST 19 15 - 41 U/L  ? ALT 10 0 - 44 U/L  ? Alkaline Phosphatase 53 38 - 126 U/L  ? Total Bilirubin 0.7 0.3 - 1.2 mg/dL  ? GFR, Estimated >60 >60 mL/min  ? Anion gap 11 5 - 15  ?TSH  ?Result Value Ref Range  ? TSH 0.865 0.350 - 4.500 uIU/mL  ?CBC  ?Result Value Ref Range  ? WBC 2.9 (L) 4.0 - 10.5 K/uL  ? RBC 3.99 3.87 - 5.11 MIL/uL  ? Hemoglobin 11.8 (L) 12.0 - 15.0 g/dL  ? HCT 35.8 (L) 36.0 - 46.0 %  ? MCV 89.7 80.0 - 100.0 fL  ? MCH 29.6 26.0 - 34.0 pg  ? MCHC 33.0 30.0 - 36.0 g/dL  ? RDW 12.6 11.5 - 15.5 %  ? Platelets 342 150 - 400 K/uL  ? nRBC 0.0 0.0 - 0.2 %  ?POC CBG monitoring  ?Result Value Ref Range  ? Glucose-Capillary 90 70 - 99 mg/dL  ? ?Plans to est care with PCP. ?Ensure adeq hydration and rest; cut down on caffeine use. ? ?Reviewed expectations re: course of current medical issues. Questions answered. ?Outlined signs and symptoms indicating need for more acute intervention. ?Patient verbalized understanding. ?After Visit Summary given. ? ? ?SUBJECTIVE: ? ?Maria Olson is a 31 y.o. female who reports "tingling and numbness" in upper and lower extremities; started sev days after completing Rx metronidazole last month. Daily but  random and transient. No extremity weakness. Normal ambulation. No headaches. Started Macrobid yesterday for UTI. Normal PO intake without n/v/d. No LE edema. ?Denies recreational drug use. Does drink a fair amt of caffeine. ?Symptoms do not wake her at night. ? ?Social History  ? ?Substance and Sexual Activity  ?Alcohol Use No  ? ?Social History  ? ?Tobacco Use  ?Smoking Status Never  ?Smokeless Tobacco Never  ? ? ?OBJECTIVE: ? ?Vitals:  ? 10/27/21 1133  ?BP: 133/79  ?Pulse: 74  ?Resp: 18  ?Temp: 98.7 ?F (37.1 ?C)  ?TempSrc: Oral  ?SpO2: 98%  ?  ?General appearance: alert; no distress ?Eyes: PERRLA; EOMI; conjunctiva normal ?HENT: normocephalic; atraumatic; TMs normal; nasal mucosa normal; oral mucosa normal ?Neck: supple with FROM ?Lungs: clear to auscultation bilaterally ?Heart: regular rate and rhythm ?Abdomen: soft, non-tender; bowel sounds normal ?Extremities: no cyanosis or edema; symmetrical with no gross deformities ?Skin: warm and dry ?Neurologic: normal gait; DTR's normal and symmetric; CN 2-12 grossly intact; normal extremity strength and sensation ?Psychological: alert and cooperative; normal mood and affect ? ?Investigations: ?Results  for orders placed or performed during the hospital encounter of 10/27/21  ?Comprehensive metabolic panel  ?Result Value Ref Range  ? Sodium 139 135 - 145 mmol/L  ? Potassium 3.8 3.5 - 5.1 mmol/L  ? Chloride 106 98 - 111 mmol/L  ? CO2 22 22 - 32 mmol/L  ? Glucose, Bld 81 70 - 99 mg/dL  ? BUN 9 6 - 20 mg/dL  ? Creatinine, Ser 0.86 0.44 - 1.00 mg/dL  ? Calcium 9.7 8.9 - 10.3 mg/dL  ? Total Protein 7.1 6.5 - 8.1 g/dL  ? Albumin 4.0 3.5 - 5.0 g/dL  ? AST 19 15 - 41 U/L  ? ALT 10 0 - 44 U/L  ? Alkaline Phosphatase 53 38 - 126 U/L  ? Total Bilirubin 0.7 0.3 - 1.2 mg/dL  ? GFR, Estimated >60 >60 mL/min  ? Anion gap 11 5 - 15  ?TSH  ?Result Value Ref Range  ? TSH 0.865 0.350 - 4.500 uIU/mL  ?CBC  ?Result Value Ref Range  ? WBC 2.9 (L) 4.0 - 10.5 K/uL  ? RBC 3.99 3.87 - 5.11 MIL/uL   ? Hemoglobin 11.8 (L) 12.0 - 15.0 g/dL  ? HCT 35.8 (L) 36.0 - 46.0 %  ? MCV 89.7 80.0 - 100.0 fL  ? MCH 29.6 26.0 - 34.0 pg  ? MCHC 33.0 30.0 - 36.0 g/dL  ? RDW 12.6 11.5 - 15.5 %  ? Platelets 342 150 - 400 K/uL  ? nRBC 0.0 0.0 - 0.2 %  ?POC CBG monitoring  ?Result Value Ref Range  ? Glucose-Capillary 90 70 - 99 mg/dL  ? ?Labs Reviewed  ?CBC - Abnormal; Notable for the following components:  ?    Result Value  ? WBC 2.9 (*)   ? Hemoglobin 11.8 (*)   ? HCT 35.8 (*)   ? All other components within normal limits  ?COMPREHENSIVE METABOLIC PANEL  ?TSH  ?CBG MONITORING, ED  ? ?No Known Allergies ? ?Past Medical History:  ?Diagnosis Date  ? Anemia   ? Chlamydia   ? Headache   ? Infection   ? UTI  ? Ovarian cyst   ? Preterm labor   ? ?Social History  ? ?Socioeconomic History  ? Marital status: Single  ?  Spouse name: Not on file  ? Number of children: Not on file  ? Years of education: Not on file  ? Highest education level: Not on file  ?Occupational History  ? Not on file  ?Tobacco Use  ? Smoking status: Never  ? Smokeless tobacco: Never  ?Vaping Use  ? Vaping Use: Never used  ?Substance and Sexual Activity  ? Alcohol use: No  ? Drug use: No  ? Sexual activity: Yes  ?  Birth control/protection: Surgical  ?Other Topics Concern  ? Not on file  ?Social History Narrative  ? Not on file  ? ?Social Determinants of Health  ? ?Financial Resource Strain: Not on file  ?Food Insecurity: Not on file  ?Transportation Needs: Not on file  ?Physical Activity: Not on file  ?Stress: Not on file  ?Social Connections: Not on file  ?Intimate Partner Violence: Not on file  ? ?Family History  ?Problem Relation Age of Onset  ? Healthy Mother   ? Asthma Brother   ? Asthma Daughter   ? Asthma Son   ? ?Past Surgical History:  ?Procedure Laterality Date  ? CESAREAN SECTION    ? CESAREAN SECTION MULTI-GESTATIONAL  06/14/2016  ? Procedure: CESAREAN SECTION MULTI-GESTATIONAL;  Surgeon: Mora Bellman, MD;  Location: [facility];  Service:  Obstetrics;;  ? INDUCED ABORTION    ? ? ? ?  ?Vanessa Kick, MD ?10/29/21 1054 ? ?

## 2021-12-11 ENCOUNTER — Other Ambulatory Visit: Payer: Self-pay

## 2021-12-11 ENCOUNTER — Ambulatory Visit (HOSPITAL_COMMUNITY)
Admission: EM | Admit: 2021-12-11 | Discharge: 2021-12-11 | Disposition: A | Discharge status: Home / Self Care | Payer: Self-pay | Attending: Family Medicine | Admitting: Family Medicine

## 2021-12-11 ENCOUNTER — Encounter (HOSPITAL_COMMUNITY): Payer: Self-pay | Admitting: Emergency Medicine

## 2021-12-11 DIAGNOSIS — R42 Dizziness and giddiness: Secondary | ICD-10-CM | POA: Insufficient documentation

## 2021-12-11 DIAGNOSIS — F419 Anxiety disorder, unspecified: Secondary | ICD-10-CM | POA: Insufficient documentation

## 2021-12-11 LAB — CBC
HCT: 34 % — ABNORMAL LOW (ref 36.0–46.0)
Hemoglobin: 11 g/dL — ABNORMAL LOW (ref 12.0–15.0)
MCH: 29.1 pg (ref 26.0–34.0)
MCHC: 32.4 g/dL (ref 30.0–36.0)
MCV: 89.9 fL (ref 80.0–100.0)
Platelets: 335 10*3/uL (ref 150–400)
RBC: 3.78 MIL/uL — ABNORMAL LOW (ref 3.87–5.11)
RDW: 13.2 % (ref 11.5–15.5)
WBC: 3.7 10*3/uL — ABNORMAL LOW (ref 4.0–10.5)
nRBC: 0 % (ref 0.0–0.2)

## 2021-12-11 LAB — BASIC METABOLIC PANEL
Anion gap: 6 (ref 5–15)
BUN: 7 mg/dL (ref 6–20)
CO2: 25 mmol/L (ref 22–32)
Calcium: 9.3 mg/dL (ref 8.9–10.3)
Chloride: 106 mmol/L (ref 98–111)
Creatinine, Ser: 0.81 mg/dL (ref 0.44–1.00)
GFR, Estimated: >60 mL/min (ref >60)
Glucose, Bld: 72 mg/dL (ref 70–99)
Potassium: 4.1 mmol/L (ref 3.5–5.1)
Sodium: 137 mmol/L (ref 135–145)

## 2021-12-11 MED ORDER — BUSPIRONE HCL 5 MG PO TABS: 5.0000 mg | ORAL_TABLET | Freq: Every day | ORAL | 0 refills | Status: DC | PRN

## 2021-12-11 NOTE — ED Provider Notes (Signed)
?[facility] ? ? ? ?CSN: [id number] ?Arrival date & time: 12/11/21  1116 ? ? ?  ? ?History   ?Chief Complaint ?Chief Complaint  ?Patient presents with  ? Dizziness  ? Headache  ? ? ?HPI ?Maria Olson is a 31 y.o. female.  ? ? ?Dizziness ?Associated symptoms: headaches   ?Headache ?Associated symptoms: dizziness   ?Here for dizziness and headaches that have been going on every morning for the last at least 5 days. ?Sometime last week, or 6 to 7 days ago, she had what she felt might have been a panic attack.  She felt very dizzy and off-balance and had a headache.  She was also very shaky during that time.  All the symptoms lasted about an hour.  Since then she has had dizziness and headaches every time she gets up. ? ?No fever, vomiting, or diarrhea in the last month. ? ?Last period just ended ? ?She states she had a history of anxiety in panic attacks when she was in high school.  At one point she had a positive ANA, but then was negative after that. ? ?Her mom brought her an antianxiety pill when she had that attack and it did seem to make her feel better and calm her down. ? ?She does not have a PCP ? ?Her periods are heavy every time she has 1 ? ?No upper respiratory symptoms. ? ?Past Medical History:  ?Diagnosis Date  ? Anemia   ? Chlamydia   ? Headache   ? Infection   ? UTI  ? Ovarian cyst   ? Preterm labor   ? ? ?Patient Active Problem List  ? Diagnosis Date Noted  ? Hx successful VBAC (vaginal birth after cesarean), currently pregnant 01/09/2016  ? Hx of fetal anomaly in prior pregnancy, currently pregnant 01/09/2016  ? ? ?Past Surgical History:  ?Procedure Laterality Date  ? CESAREAN SECTION    ? CESAREAN SECTION MULTI-GESTATIONAL  06/14/2016  ? Procedure: CESAREAN SECTION MULTI-GESTATIONAL;  Surgeon: Catalina AntiguaPeggy Constant, MD;  Location: [facility];  Service: Obstetrics;;  ? INDUCED ABORTION    ? ? ?OB History   ? ? Gravida  ?7  ? Para  ?4  ? Term  ?2  ? Preterm  ?2  ? AB  ?3  ? Living  ?5  ?   ? ? SAB  ?1  ? IAB  ?2  ? Ectopic  ?0  ? Multiple  ?1  ? Live Births  ?5  ?   ?  ? Obstetric Comments  ?2017 del c/s for twins  ?  ? ?  ? ? ? ?Home Medications   ? ?Prior to Admission medications   ?Medication Sig Start Date End Date Taking? Authorizing Provider  ?busPIRone (BUSPAR) 5 MG tablet Take 1-2 tablets (5-10 mg total) by mouth daily as needed (anxiety). 12/11/21  Yes Zenia ResidesBanister, Mirren Gest K, MD  ? ? ?Family History ?Family History  ?Problem Relation Age of Onset  ? Healthy Mother   ? Asthma Brother   ? Asthma Daughter   ? Asthma Son   ? ? ?Social History ?Social History  ? ?Tobacco Use  ? Smoking status: Never  ? Smokeless tobacco: Never  ?Vaping Use  ? Vaping Use: Never used  ?Substance Use Topics  ? Alcohol use: No  ? Drug use: No  ? ? ? ?Allergies   ?Patient has no known allergies. ? ? ?Review of Systems ?Review of Systems  ?Neurological:  Positive for dizziness  and headaches.  ? ? ?Physical Exam ?Triage Vital Signs ?ED Triage Vitals [12/11/21 1151]  ?Enc Vitals Group  ?   BP (!) 132/91  ?   Pulse Rate 75  ?   Resp 16  ?   Temp 98 ?F (36.7 ?C)  ?   Temp Source Oral  ?   SpO2 95 %  ?   Weight 174 lb 9.7 oz (79.2 kg)  ?   Height 5\' 3"  (1.6 m)  ?   Head Circumference   ?   Peak Flow   ?   Pain Score 0  ?   Pain Loc   ?   Pain Edu?   ?   Excl. in GC?   ? ?No data found. ? ?Updated Vital Signs ?BP (!) 132/91 (BP Location: Right Arm)   Pulse 75   Temp 98 ?F (36.7 ?C) (Oral)   Resp 16   Ht 5\' 3"  (1.6 m)   Wt 79.2 kg   SpO2 95%   BMI 30.93 kg/m?  ? ?Visual Acuity ?Right Eye Distance:   ?Left Eye Distance:   ?Bilateral Distance:   ? ?Right Eye Near:   ?Left Eye Near:    ?Bilateral Near:    ? ?Physical Exam ?Vitals reviewed.  ?Constitutional:   ?   General: She is not in acute distress. ?   Appearance: She is not toxic-appearing.  ?HENT:  ?   Right Ear: Tympanic membrane and ear canal normal.  ?   Left Ear: Tympanic membrane and ear canal normal.  ?   Nose: Nose normal.  ?   Mouth/Throat:  ?   Mouth: Mucous  membranes are moist.  ?   Pharynx: No oropharyngeal exudate or posterior oropharyngeal erythema.  ?Eyes:  ?   Extraocular Movements: Extraocular movements intact.  ?   Conjunctiva/sclera: Conjunctivae normal.  ?   Pupils: Pupils are equal, round, and reactive to light.  ?Cardiovascular:  ?   Rate and Rhythm: Normal rate and regular rhythm.  ?   Heart sounds: No murmur heard. ?Pulmonary:  ?   Effort: Pulmonary effort is normal. No respiratory distress.  ?   Breath sounds: No wheezing, rhonchi or rales.  ?Musculoskeletal:  ?   Cervical back: Neck supple.  ?Lymphadenopathy:  ?   Cervical: No cervical adenopathy.  ?Skin: ?   Capillary Refill: Capillary refill takes less than 2 seconds.  ?   Coloration: Skin is not jaundiced or pale.  ?Neurological:  ?   General: No focal deficit present.  ?   Mental Status: She is alert and oriented to person, place, and time.  ?Psychiatric:     ?   Behavior: Behavior normal.  ? ? ? ?UC Treatments / Results  ?Labs ?(all labs ordered are listed, but only abnormal results are displayed) ?Labs Reviewed  ?BASIC METABOLIC PANEL  ?CBC  ? ? ?EKG ? ? ?Radiology ?No results found. ? ?Procedures ?Procedures (including critical care time) ? ?Medications Ordered in UC ?Medications - No data to display ? ?Initial Impression / Assessment and Plan / UC Course  ?I have reviewed the triage vital signs and the nursing notes. ? ?Pertinent labs & imaging results that were available during my care of the patient were reviewed by me and considered in my medical decision making (see chart for details). ? ?  ? ?We will repeat lab work today to look for electrolyte abnormalities or worsening anemia.  Request made to help her find a PCP ?Buspar for  as needed for anxiety, if recurs ?Final Clinical Impressions(s) / UC Diagnoses  ? ?Final diagnoses:  ?Dizziness  ?Anxiety  ? ? ? ?Discharge Instructions   ? ?  ?We have drawn a blood count and an electrolyte panel today.  Staff will call you if there is any  significant abnormality.  Your hemoglobin was just a bit low in March ? ?Take buspirone 5 mg--1 to 2 tablets daily as needed for anxiety or panic ? ? ? ? ?ED Prescriptions   ? ? Medication Sig Dispense Auth. Provider  ? busPIRone (BUSPAR) 5 MG tablet Take 1-2 tablets (5-10 mg total) by mouth daily as needed (anxiety). 20 tablet Zenia Resides, MD  ? ?  ? ?I have reviewed the PDMP during this encounter. ?  ?Zenia Resides, MD ?12/11/21 1235 ? ?

## 2021-12-11 NOTE — ED Triage Notes (Signed)
Pt reports dizziness and headaches for the past couple mornings. States she had an anxiety attack recently and felt as if she was having a seizure inside her body. States the dizziness and headaches began after the anxiety episode. Denies current dizziness or headache at this time.  ?

## 2021-12-11 NOTE — Discharge Instructions (Signed)
We have drawn a blood count and an electrolyte panel today.  Staff will call you if there is any significant abnormality.  Your hemoglobin was just a bit low in March ? ?Take buspirone 5 mg--1 to 2 tablets daily as needed for anxiety or panic ?

## 2021-12-12 ENCOUNTER — Encounter (HOSPITAL_COMMUNITY): Payer: Self-pay

## 2022-02-12 ENCOUNTER — Encounter (HOSPITAL_COMMUNITY): Payer: Self-pay

## 2022-02-12 ENCOUNTER — Ambulatory Visit (HOSPITAL_COMMUNITY)
Admission: EM | Admit: 2022-02-12 | Discharge: 2022-02-12 | Disposition: A | Discharge status: Home / Self Care | Payer: Self-pay | Attending: Family Medicine | Admitting: Family Medicine

## 2022-02-12 DIAGNOSIS — N3090 Cystitis, unspecified without hematuria: Secondary | ICD-10-CM | POA: Insufficient documentation

## 2022-02-12 LAB — POCT URINALYSIS DIPSTICK, ED / UC
Bilirubin Urine: NEGATIVE
Glucose, UA: NEGATIVE mg/dL
Hgb urine dipstick: NEGATIVE
Ketones, ur: NEGATIVE mg/dL
Nitrite: NEGATIVE
Protein, ur: NEGATIVE mg/dL
Specific Gravity, Urine: 1.02 (ref 1.005–1.030)
Urobilinogen, UA: 0.2 mg/dL (ref 0.0–1.0)
pH: 5.5 (ref 5.0–8.0)

## 2022-02-12 MED ORDER — CEPHALEXIN 500 MG PO CAPS: 500.0000 mg | ORAL_CAPSULE | Freq: Two times a day (BID) | ORAL | 0 refills | Status: DC

## 2022-02-12 MED ORDER — FLUCONAZOLE 150 MG PO TABS: ORAL_TABLET | ORAL | 0 refills | Status: DC

## 2022-02-13 LAB — URINE CULTURE

## 2022-05-16 ENCOUNTER — Ambulatory Visit (HOSPITAL_COMMUNITY)
Admission: EM | Admit: 2022-05-16 | Discharge: 2022-05-16 | Disposition: A | Discharge status: Home / Self Care | Payer: Self-pay | Attending: Sports Medicine | Admitting: Sports Medicine

## 2022-05-16 ENCOUNTER — Encounter (HOSPITAL_COMMUNITY): Payer: Self-pay

## 2022-05-16 DIAGNOSIS — Z113 Encounter for screening for infections with a predominantly sexual mode of transmission: Secondary | ICD-10-CM | POA: Insufficient documentation

## 2022-05-16 DIAGNOSIS — F419 Anxiety disorder, unspecified: Secondary | ICD-10-CM | POA: Insufficient documentation

## 2022-05-16 MED ORDER — BUSPIRONE HCL 5 MG PO TABS: 5.0000 mg | ORAL_TABLET | Freq: Every day | ORAL | 0 refills | Status: DC | PRN

## 2022-05-16 NOTE — ED Triage Notes (Signed)
Pt reports knot on her chest for several weeks.  Pt reports the knot is recurring. Pt is requesting a refill on her anxiety medication. Pt would like to be retested for her STI today.

## 2022-05-16 NOTE — Discharge Instructions (Addendum)
I have refilled your BuSpar 5 mg for your anxiety to be used daily.  You will need to schedule an appointment with your primary care provider for further management. We collected testing for STDs today.  If there is anything abnormal urgent care staff will call you with results and appropriate treatment.  Recommend condoms for safe sexual practices.

## 2022-05-16 NOTE — ED Provider Notes (Signed)
[facility]    CSN: [id number] Arrival date & time: 05/16/22  1028      History   Chief Complaint Chief Complaint  Patient presents with   Exposure to STD   Anxiety   Cyst    HPI Maria Olson is a 31 y.o. female.   She presents today for STD screening and refill of her anxiety medicines.  She is requesting testing as she has a new partner.  She denies any dysuria, vaginal discharge or odor.  In the setting of her anxiety.  She states she is recently been having more anxiety and is using her medication as needed so it has lasted her longer than a month supply.  She is reapplying for new insurance coverage starting in November.  After that she plans to establish with primary care provider for further management of her anxiety.  She describes her anxiety has thoughts racing in her head and jumping around in her head.   Exposure to STD  Anxiety    Past Medical History:  Diagnosis Date   Anemia    Chlamydia    Headache    Infection    UTI   Ovarian cyst    Preterm labor     Patient Active Problem List   Diagnosis Date Noted   Hx successful VBAC (vaginal birth after cesarean), currently pregnant 01/09/2016   Hx of fetal anomaly in prior pregnancy, currently pregnant 01/09/2016    Past Surgical History:  Procedure Laterality Date   CESAREAN SECTION     CESAREAN SECTION MULTI-GESTATIONAL  06/14/2016   Procedure: CESAREAN SECTION MULTI-GESTATIONAL;  Surgeon: Catalina Antigua, MD;  Location: [facility];  Service: Obstetrics;;   INDUCED ABORTION      OB History     Gravida  7   Para  4   Term  2   Preterm  2   AB  3   Living  5      SAB  1   IAB  2   Ectopic  0   Multiple  1   Live Births  5        Obstetric Comments  2017 del c/s for twins          Home Medications    Prior to Admission medications   Medication Sig Start Date End Date Taking? Authorizing Provider  busPIRone (BUSPAR) 5 MG tablet Take 1 tablet (5  mg total) by mouth daily as needed (anxiety). 05/16/22   Claudie Leach, DO  cephALEXin (KEFLEX) 500 MG capsule Take 1 capsule (500 mg total) by mouth 2 (two) times daily. 02/12/22   Mardella Layman, MD  fluconazole (DIFLUCAN) 150 MG tablet Take one tablet by mouth as a single dose. May repeat in 3 days if symptoms persist. 02/12/22   Mardella Layman, MD    Family History Family History  Problem Relation Age of Onset   Healthy Mother    Asthma Brother    Asthma Daughter    Asthma Son     Social History Social History   Tobacco Use   Smoking status: Never   Smokeless tobacco: Never  Vaping Use   Vaping Use: Never used  Substance Use Topics   Alcohol use: No   Drug use: No     Allergies   Patient has no known allergies.   Review of Systems Review of Systems as listed above in HPI   Physical Exam Triage Vital Signs ED Triage Vitals [05/16/22 1104]  Enc Vitals Group     BP 137/82     Pulse Rate 76     Resp 16     Temp 97.8 F (36.6 C)     Temp Source Oral     SpO2 100 %     Weight      Height      Head Circumference      Peak Flow      Pain Score      Pain Loc      Pain Edu?      Excl. in GC?    No data found.  Updated Vital Signs BP 137/82 (BP Location: Left Arm)   Pulse 76   Temp 97.8 F (36.6 C) (Oral)   Resp 16   SpO2 100%     Physical Exam Vitals reviewed.  Constitutional:      General: She is not in acute distress.    Appearance: Normal appearance. She is not ill-appearing, toxic-appearing or diaphoretic.  HENT:     Head: Normocephalic.  Eyes:     Conjunctiva/sclera: Conjunctivae normal.  Cardiovascular:     Rate and Rhythm: Normal rate.  Pulmonary:     Effort: Pulmonary effort is normal.  Neurological:     Mental Status: She is alert and oriented to person, place, and time.  Psychiatric:        Mood and Affect: Mood normal.        Behavior: Behavior normal.        Thought Content: Thought content normal.        Judgment: Judgment  normal.      UC Treatments / Results  Labs (all labs ordered are listed, but only abnormal results are displayed) Labs Reviewed  RPR  HIV ANTIBODY (ROUTINE TESTING W REFLEX)  HEPATITIS C ANTIBODY  CERVICOVAGINAL ANCILLARY ONLY    EKG   Radiology No results found.  Procedures Procedures (including critical care time)  Medications Ordered in UC Medications - No data to display  Initial Impression / Assessment and Plan / UC Course  I have reviewed the triage vital signs and the nursing notes.  Pertinent labs & imaging results that were available during my care of the patient were reviewed by me and considered in my medical decision making (see chart for details).     Patient is here today for refill of her anxiety medication.  She is currently taking BuSpar 5 mg as needed.  I discussed with her using this medication daily.  She will also need to establish with a primary care once she has her insurance coverage for further management of this.  I did recommend an SSRI be used as first-line treatment.  She verbalized understanding.  I did provide her a refill today of BuSpar 5 mg.  She is also requesting STD testing as she has a new sexual partner.  I did discuss with her safe sexual practice including condom use to prevent STDs.  Vaginal swab collected along with blood work.  Urgent care staff will call with abnormal results and appropriate treatment as needed. Final Clinical Impressions(s) / UC Diagnoses   Final diagnoses:  Anxiety  Screening examination for STD (sexually transmitted disease)     Discharge Instructions      I have refilled your BuSpar 5 mg for your anxiety to be used daily.  You will need to schedule an appointment with your primary care provider for further management. We collected testing for STDs today.  If there is anything  abnormal urgent care staff will call you with results and appropriate treatment.  Recommend condoms for safe sexual  practices.     ED Prescriptions     Medication Sig Dispense Auth. Provider   busPIRone (BUSPAR) 5 MG tablet Take 1 tablet (5 mg total) by mouth daily as needed (anxiety). 60 tablet Rodena Goldmann A, DO      PDMP not reviewed this encounter.   Rodena Goldmann A, DO 05/16/22 1142

## 2022-05-17 ENCOUNTER — Telehealth (HOSPITAL_COMMUNITY): Payer: Self-pay | Admitting: Emergency Medicine

## 2022-05-17 LAB — HEPATITIS C ANTIBODY: HCV Ab: NONREACTIVE — AB

## 2022-05-17 LAB — CERVICOVAGINAL ANCILLARY ONLY
Bacterial Vaginitis (gardnerella): POSITIVE — AB
Candida Glabrata: NEGATIVE
Candida Vaginitis: NEGATIVE
Chlamydia: NEGATIVE
Comment: NEGATIVE
Comment: NEGATIVE
Comment: NEGATIVE
Comment: NEGATIVE
Comment: NEGATIVE
Comment: NORMAL
Neisseria Gonorrhea: NEGATIVE
Trichomonas: POSITIVE — AB

## 2022-05-17 LAB — HIV ANTIBODY (ROUTINE TESTING W REFLEX): HIV Screen 4th Generation wRfx: NONREACTIVE

## 2022-05-17 LAB — RPR: RPR Ser Ql: NONREACTIVE

## 2022-05-17 MED ORDER — METRONIDAZOLE 500 MG PO TABS: 500.0000 mg | ORAL_TABLET | Freq: Two times a day (BID) | ORAL | 0 refills | Status: DC

## 2022-06-13 ENCOUNTER — Ambulatory Visit
Admission: RE | Admit: 2022-06-13 | Discharge: 2022-06-13 | Disposition: A | Discharge status: Home / Self Care | Payer: Self-pay | Source: Ambulatory Visit | Attending: Internal Medicine | Admitting: Internal Medicine

## 2022-06-13 VITALS — BP 122/84 | HR 69 | Temp 97.9°F | Resp 18

## 2022-06-13 DIAGNOSIS — R109 Unspecified abdominal pain: Secondary | ICD-10-CM | POA: Insufficient documentation

## 2022-06-13 DIAGNOSIS — Z113 Encounter for screening for infections with a predominantly sexual mode of transmission: Secondary | ICD-10-CM | POA: Insufficient documentation

## 2022-06-13 DIAGNOSIS — Z3202 Encounter for pregnancy test, result negative: Secondary | ICD-10-CM | POA: Insufficient documentation

## 2022-06-13 LAB — POCT URINALYSIS DIP (MANUAL ENTRY)
Bilirubin, UA: NEGATIVE
Blood, UA: NEGATIVE
Glucose, UA: NEGATIVE mg/dL
Ketones, POC UA: NEGATIVE mg/dL
Leukocytes, UA: NEGATIVE
Nitrite, UA: NEGATIVE
Protein Ur, POC: NEGATIVE mg/dL
Spec Grav, UA: >=1.03 — AB (ref 1.010–1.025)
Urobilinogen, UA: 0.2 E.U./dL
pH, UA: 6 (ref 5.0–8.0)

## 2022-06-13 LAB — POCT URINE PREGNANCY: Preg Test, Ur: NEGATIVE

## 2022-06-13 MED ORDER — METRONIDAZOLE 500 MG PO TABS
500.0000 mg | ORAL_TABLET | Freq: Two times a day (BID) | ORAL | 0 refills | Status: DC
Start: 2022-06-13 — End: 2023-01-02

## 2022-06-13 NOTE — ED Provider Notes (Signed)
Maria Olson    CSN: [id number] Arrival date & time: 06/13/22  0849      History   Chief Complaint Chief Complaint  Patient presents with   Abdominal Pain   SEXUALLY TRANSMITTED DISEASE    HPI Maria Olson is a 31 y.o. female.   Patient presents with some intermittent lower mild abdominal cramping that started yesterday.  Patient denies any abnormal vaginal discharge, abnormal vaginal bleeding, dysuria, urinary frequency, hematuria, back pain, fever.  Denies nausea, vomiting, diarrhea, constipation.  Patient having normal bowel movements and denies blood in stool.  Patient reports that she tested positive for trichomoniasis approximately 1 month ago.  She was treated with metronidazole with resolution of symptoms, and this is a new symptom.  She states that that sexual partner was tested for STDs with a urine test but was negative.  She had another sexual encounter recently with the same sexual partner but reports that they simply did "foreplay", and no penetrative unprotected sexual intercourse occurred.  Last menstrual cycle was approximately 1 to 2 weeks ago.  She has been having normal menstrual cycles.  She reports history of tubal ligation.  Patient would like STD testing but does not want testing for HIV or syphilis.   Abdominal Pain   Past Medical History:  Diagnosis Date   Anemia    Chlamydia    Headache    Infection    UTI   Ovarian cyst    Preterm labor     Patient Active Problem List   Diagnosis Date Noted   Hx successful VBAC (vaginal birth after cesarean), currently pregnant 01/09/2016   Hx of fetal anomaly in prior pregnancy, currently pregnant 01/09/2016    Past Surgical History:  Procedure Laterality Date   CESAREAN SECTION     CESAREAN SECTION MULTI-GESTATIONAL  06/14/2016   Procedure: CESAREAN SECTION MULTI-GESTATIONAL;  Surgeon: Mora Bellman, MD;  Location: [place];  Service: Obstetrics;;   INDUCED ABORTION      OB  History     Gravida  7   Para  4   Term  2   Preterm  2   AB  3   Living  5      SAB  1   IAB  2   Ectopic  0   Multiple  1   Live Births  5        Obstetric Comments  2017 del c/s for twins          Home Medications    Prior to Admission medications   Medication Sig Start Date End Date Taking? Authorizing Provider  metroNIDAZOLE (FLAGYL) 500 MG tablet Take 1 tablet (500 mg total) by mouth 2 (two) times daily. 06/13/22  Yes Kyla Duffy, Hildred Alamin E, FNP  busPIRone (BUSPAR) 5 MG tablet Take 1 tablet (5 mg total) by mouth daily as needed (anxiety). 05/16/22   Elmore Guise, DO  cephALEXin (KEFLEX) 500 MG capsule Take 1 capsule (500 mg total) by mouth 2 (two) times daily. 02/12/22   Vanessa Kick, MD  fluconazole (DIFLUCAN) 150 MG tablet Take one tablet by mouth as a single dose. May repeat in 3 days if symptoms persist. 02/12/22   Vanessa Kick, MD    Family History Family History  Problem Relation Age of Onset   Healthy Mother    Asthma Brother    Asthma Daughter    Asthma Son     Social History Social History   Tobacco Use   Smoking  status: Never   Smokeless tobacco: Never  Vaping Use   Vaping Use: Never used  Substance Use Topics   Alcohol use: No   Drug use: No     Allergies   Patient has no known allergies.   Review of Systems Review of Systems Per HPI  Physical Exam Triage Vital Signs ED Triage Vitals [06/13/22 0917]  Enc Vitals Group     BP 122/84     Pulse Rate 69     Resp 18     Temp 97.9 F (36.6 C)     Temp src      SpO2 97 %     Weight      Height      Head Circumference      Peak Flow      Pain Score 2     Pain Loc      Pain Edu?      Excl. in GC?    No data found.  Updated Vital Signs BP 122/84   Pulse 69   Temp 97.9 F (36.6 C)   Resp 18   LMP 06/03/2022 (Approximate)   SpO2 97%   Breastfeeding No   Visual Acuity Right Eye Distance:   Left Eye Distance:   Bilateral Distance:    Right Eye Near:    Left Eye Near:    Bilateral Near:     Physical Exam Constitutional:      General: She is not in acute distress.    Appearance: Normal appearance. She is not toxic-appearing or diaphoretic.  HENT:     Head: Normocephalic and atraumatic.  Eyes:     Extraocular Movements: Extraocular movements intact.     Conjunctiva/sclera: Conjunctivae normal.  Cardiovascular:     Rate and Rhythm: Normal rate and regular rhythm.     Pulses: Normal pulses.     Heart sounds: Normal heart sounds.  Pulmonary:     Effort: Pulmonary effort is normal. No respiratory distress.     Breath sounds: Normal breath sounds.  Abdominal:     General: Bowel sounds are normal. There is no distension.     Palpations: Abdomen is soft.     Tenderness: There is no abdominal tenderness.  Genitourinary:    Comments: Deferred with shared decision-making.  Self swab performed. Neurological:     General: No focal deficit present.     Mental Status: She is alert and oriented to person, place, and time. Mental status is at baseline.  Psychiatric:        Mood and Affect: Mood normal.        Behavior: Behavior normal.        Thought Content: Thought content normal.        Judgment: Judgment normal.      UC Treatments / Results  Labs (all labs ordered are listed, but only abnormal results are displayed) Labs Reviewed  POCT URINALYSIS DIP (MANUAL ENTRY) - Abnormal; Notable for the following components:      Result Value   Clarity, UA cloudy (*)    Spec Grav, UA >=1.030 (*)    All other components within normal limits  POCT URINE PREGNANCY  CERVICOVAGINAL ANCILLARY ONLY    EKG   Radiology No results found.  Procedures Procedures (including critical Olson time)  Medications Ordered in UC Medications - No data to display  Initial Impression / Assessment and Plan / UC Course  I have reviewed the triage vital signs and the nursing notes.  Pertinent labs &  imaging results that were available during my Olson  of the patient were reviewed by me and considered in my medical decision making (see chart for details).     Urine pregnancy test completed to ensure there is no failure of tubal ligation.  It was negative.  Urinalysis unremarkable.  Cervicovaginal swab pending.  Highly suspicious that patient could have been reinfected with trichomoniasis so metronidazole was sent for patient again.  Advised patient to ensure sexual partner is tested again especially if test results for her are positive.  Will await results for any further treatment.  No concern for PID at this time.  Patient was advised to follow-up if symptoms persist or worsen.  Also advised patient to refrain from sexual activity until test results and treatment are complete.  Patient verbalized understanding and was agreeable with plan. Final Clinical Impressions(s) / UC Diagnoses   Final diagnoses:  Abdominal cramping  Screening examination for venereal disease  Urine pregnancy test negative     Discharge Instructions      Your pregnancy test and urine tests were both negative.  Your STD test is pending.  I have prescribed you metronidazole again in case you have been reexposed to trichomoniasis.  Please follow-up if any symptoms persist or worsen.  We will call if any test results are positive.     ED Prescriptions     Medication Sig Dispense Auth. Provider   metroNIDAZOLE (FLAGYL) 500 MG tablet Take 1 tablet (500 mg total) by mouth 2 (two) times daily. 14 tablet [place], Acie Fredrickson, [place]      PDMP not reviewed this encounter.   Gustavus Bryant, [place] 06/13/22 [phone]

## 2022-06-13 NOTE — ED Triage Notes (Addendum)
Pt is present today with lower abdominal pain.  Pt states she would like STD testing done.  Pt states she would also like blood work done today

## 2022-06-13 NOTE — Discharge Instructions (Signed)
Your pregnancy test and urine tests were both negative.  Your STD test is pending.  I have prescribed you metronidazole again in case you have been reexposed to trichomoniasis.  Please follow-up if any symptoms persist or worsen.  We will call if any test results are positive.

## 2022-06-14 LAB — CERVICOVAGINAL ANCILLARY ONLY
Bacterial Vaginitis (gardnerella): POSITIVE — AB
Candida Glabrata: NEGATIVE
Candida Vaginitis: NEGATIVE
Chlamydia: NEGATIVE
Comment: NEGATIVE
Comment: NEGATIVE
Comment: NEGATIVE
Comment: NEGATIVE
Comment: NEGATIVE
Comment: NORMAL
Neisseria Gonorrhea: NEGATIVE
Trichomonas: NEGATIVE

## 2022-09-24 ENCOUNTER — Other Ambulatory Visit: Payer: Self-pay

## 2022-09-24 ENCOUNTER — Emergency Department (HOSPITAL_COMMUNITY)
Admission: EM | Admit: 2022-09-24 | Discharge: 2022-09-25 | Discharge status: Against Medical Advice | Payer: Self-pay | Attending: Student | Admitting: Student

## 2022-09-24 DIAGNOSIS — R202 Paresthesia of skin: Secondary | ICD-10-CM | POA: Insufficient documentation

## 2022-09-24 DIAGNOSIS — R519 Headache, unspecified: Secondary | ICD-10-CM | POA: Insufficient documentation

## 2022-09-24 DIAGNOSIS — Z5321 Procedure and treatment not carried out due to patient leaving prior to being seen by health care provider: Secondary | ICD-10-CM | POA: Insufficient documentation

## 2022-09-24 DIAGNOSIS — M436 Torticollis: Secondary | ICD-10-CM | POA: Insufficient documentation

## 2022-09-24 LAB — BASIC METABOLIC PANEL
Anion gap: 5 (ref 5–15)
BUN: 9 mg/dL (ref 6–20)
CO2: 24 mmol/L (ref 22–32)
Calcium: 9.5 mg/dL (ref 8.9–10.3)
Chloride: 108 mmol/L (ref 98–111)
Creatinine, Ser: 0.79 mg/dL (ref 0.44–1.00)
GFR, Estimated: >60 mL/min (ref >60)
Glucose, Bld: 95 mg/dL (ref 70–99)
Potassium: 3.6 mmol/L (ref 3.5–5.1)
Sodium: 137 mmol/L (ref 135–145)

## 2022-09-24 LAB — CBC WITH DIFFERENTIAL/PLATELET
Abs Immature Granulocytes: 0.01 10*3/uL (ref 0.00–0.07)
Basophils Absolute: 0 10*3/uL (ref 0.0–0.1)
Basophils Relative: 1 %
Eosinophils Absolute: 0.1 10*3/uL (ref 0.0–0.5)
Eosinophils Relative: 1 %
HCT: 34.4 % — ABNORMAL LOW (ref 36.0–46.0)
Hemoglobin: 11.7 g/dL — ABNORMAL LOW (ref 12.0–15.0)
Immature Granulocytes: 0 %
Lymphocytes Relative: 42 %
Lymphs Abs: 1.8 10*3/uL (ref 0.7–4.0)
MCH: 29.8 pg (ref 26.0–34.0)
MCHC: 34 g/dL (ref 30.0–36.0)
MCV: 87.8 fL (ref 80.0–100.0)
Monocytes Absolute: 0.5 10*3/uL (ref 0.1–1.0)
Monocytes Relative: 11 %
Neutro Abs: 1.9 10*3/uL (ref 1.7–7.7)
Neutrophils Relative %: 45 %
Platelets: 373 10*3/uL (ref 150–400)
RBC: 3.92 MIL/uL (ref 3.87–5.11)
RDW: 12.8 % (ref 11.5–15.5)
WBC: 4.3 10*3/uL (ref 4.0–10.5)
nRBC: 0 % (ref 0.0–0.2)

## 2022-09-24 LAB — I-STAT BETA HCG BLOOD, ED (MC, WL, AP ONLY): I-stat hCG, quantitative: <5 m[IU]/mL (ref <5)

## 2022-09-24 MED ORDER — IBUPROFEN 400 MG PO TABS
600.0000 mg | ORAL_TABLET | Freq: Once | ORAL | Status: AC
Start: 2022-09-24 — End: 2022-09-24
  Administered 2022-09-24: 600 mg via ORAL
  Filled 2022-09-24: qty 1

## 2022-09-24 MED ORDER — METOCLOPRAMIDE HCL 10 MG PO TABS
10.0000 mg | ORAL_TABLET | Freq: Once | ORAL | Status: AC
Start: 2022-09-24 — End: 2022-09-24
  Administered 2022-09-24: 10 mg via ORAL
  Filled 2022-09-24: qty 1

## 2022-09-24 NOTE — ED Provider Triage Note (Signed)
Emergency Medicine Provider Triage Evaluation Note  Maria Olson , a 32 y.o. female  was evaluated in triage.  Pt complains of headache x 2 days in the temples, also having some neck tightness on the left side with tingling sensation shooting down the left arm occasionally it started in the same timeframe.  No blurry double vision.  No history of migraines.  Patient with concern for a large bruise on the right leg which she states did not have preceding injury.  Reports bruising easily but denies bleeding from the gums.  Review of Systems  Positive: As above Negative: Fevers, chills, nausea, vomiting, blurry or double vision  Physical Exam  BP (!) 142/86   Pulse 75   Temp 99.3 F (37.4 C) (Oral)   Resp 17   SpO2 94%  Gen:   Awake, no distress   Resp:  Normal effort  MSK:   Moves extremities without difficulty  Other:  RRR no setters is G.  Lungs CTAB symmetric strength and sensation upper extremities bilaterally.  Has been tenderness palpation cervical paraspinous musculature on the left as well as left trapezius.  Medical Decision Making  Medically screening exam initiated at 10:35 PM.  Appropriate orders placed.  Su Hilt Capelle was informed that the remainder of the evaluation will be completed by another provider, this initial triage assessment does not replace that evaluation, and the importance of remaining in the ED until their evaluation is complete.  This chart was dictated using voice recognition software, Dragon. Despite the best efforts of this provider to proofread and correct errors, errors may still occur which can change documentation meaning.    Emeline Darling, PA-C 09/24/22 2241

## 2022-09-24 NOTE — ED Triage Notes (Signed)
Patient reports migraine headache for several days with nausea.

## 2022-09-25 NOTE — ED Notes (Signed)
Pt called for repeat VS x3, no response.

## 2023-01-01 ENCOUNTER — Ambulatory Visit (HOSPITAL_COMMUNITY)
Admission: EM | Admit: 2023-01-01 | Discharge: 2023-01-01 | Disposition: A | Discharge status: Home / Self Care | Payer: 59

## 2023-01-01 NOTE — ED Notes (Signed)
No answer from lobby  

## 2023-01-02 ENCOUNTER — Encounter (HOSPITAL_COMMUNITY): Payer: Self-pay | Admitting: Emergency Medicine

## 2023-01-02 ENCOUNTER — Ambulatory Visit (HOSPITAL_COMMUNITY)
Admission: EM | Admit: 2023-01-02 | Discharge: 2023-01-02 | Disposition: A | Discharge status: Home / Self Care | Payer: 59 | Attending: Urgent Care | Admitting: Urgent Care

## 2023-01-02 DIAGNOSIS — Z113 Encounter for screening for infections with a predominantly sexual mode of transmission: Secondary | ICD-10-CM | POA: Diagnosis not present

## 2023-01-02 DIAGNOSIS — N926 Irregular menstruation, unspecified: Secondary | ICD-10-CM | POA: Insufficient documentation

## 2023-01-02 DIAGNOSIS — R0789 Other chest pain: Secondary | ICD-10-CM | POA: Insufficient documentation

## 2023-01-02 DIAGNOSIS — M62830 Muscle spasm of back: Secondary | ICD-10-CM | POA: Insufficient documentation

## 2023-01-02 HISTORY — DX: Anxiety disorder, unspecified: F41.9

## 2023-01-02 LAB — POCT URINE PREGNANCY: Preg Test, Ur: NEGATIVE

## 2023-01-02 MED ORDER — METHOCARBAMOL 500 MG PO TABS: 500.0000 mg | ORAL_TABLET | Freq: Three times a day (TID) | ORAL | 0 refills | Status: DC | PRN

## 2023-01-02 NOTE — Discharge Instructions (Addendum)
Your EKG is normal.  Your blood pressure is perfect. Your symptoms sound like muscle spasms.  This is likely secondary to dehydration.  Please cut back on sodas and juices, and increase your water intake. I have given you a muscle relaxer to take on an as-needed basis.  You may take this up to 3 times daily.  Use with caution however as it may make you tired or drowsy.  Your pregnancy test is negative.  Please follow-up with your gynecologist if your menstrual periods continue to be erratic.  We will call with results of your vaginal swab if any results are positive.  He will also be able to review them on MyChart.

## 2023-01-02 NOTE — ED Triage Notes (Addendum)
Pt wanting pregnancy test and STD testing. Denies exposure or s/s.  LMP was 4/5. Pt reports that she has her tubes tied but still concerned for pregnancy

## 2023-01-02 NOTE — ED Provider Notes (Signed)
[facility]    CSN: [id number] Arrival date & time: 01/02/23  0818      History   Chief Complaint Chief Complaint  Patient presents with   Amenorrhea   SEXUALLY TRANSMITTED DISEASE    HPI Maria Olson is a 32 y.o. female.   32yo female presents today for several different concerns. She is requesting a pregnancy test due to lack of menses. Last menstrual period was 4/5. Pt states her menses usually lasts 3 days only. She does have her tubes tied but would like to verify. Additionally, pt would like to be re-tested for STDs. She and partner had trichomonas roughly 8 months ago. She does not believe they waited the whole week prior to resuming intercourse and wants to make sure it hasn't come back. She denies any current symptoms. She denies pelvic pain, vaginal itching or urinary symptoms. Lastly, pt reports some discomfort to her chest. States it has been occurring intermittently. Has had four episodes in the past two weeks and "feels something coming on now." States it feels muscular, and raising her L arm helps relieve the discomfort. Some radiation to shoulder but no jaw pain. Pt was seen a while back for muscle spasms in her back, and she states this continues as well. She never filled the muscle relaxer previously called in. She admits to drinking primarily sodas and some juice, no water. She denies palpitations. States when the pain starts, it resolves in a few minutes spontaneously, with no particular tx tried. No SOB or cough.      Past Medical History:  Diagnosis Date   Anemia    Anxiety    Chlamydia    Headache    Infection    UTI   Ovarian cyst    Preterm labor     Patient Active Problem List   Diagnosis Date Noted   Hx successful VBAC (vaginal birth after cesarean), currently pregnant 01/09/2016   Hx of fetal anomaly in prior pregnancy, currently pregnant 01/09/2016    Past Surgical History:  Procedure Laterality Date   CESAREAN SECTION      CESAREAN SECTION MULTI-GESTATIONAL  06/14/2016   Procedure: CESAREAN SECTION MULTI-GESTATIONAL;  Surgeon: Catalina Antigua, MD;  Location: [facility];  Service: Obstetrics;;   INDUCED ABORTION      OB History     Gravida  7   Para  4   Term  2   Preterm  2   AB  3   Living  5      SAB  1   IAB  2   Ectopic  0   Multiple  1   Live Births  5        Obstetric Comments  2017 del c/s for twins          Home Medications    Prior to Admission medications   Medication Sig Start Date End Date Taking? Authorizing Provider  methocarbamol (ROBAXIN) 500 MG tablet Take 1 tablet (500 mg total) by mouth every 8 (eight) hours as needed for muscle spasms. 01/02/23  Yes Mukesh Kornegay L, PA  busPIRone (BUSPAR) 5 MG tablet Take 1 tablet (5 mg total) by mouth daily as needed (anxiety). 05/16/22   Claudie Leach, DO    Family History Family History  Problem Relation Age of Onset   Healthy Mother    Asthma Brother    Asthma Daughter    Asthma Son     Social History Social History  Tobacco Use   Smoking status: Never   Smokeless tobacco: Never  Vaping Use   Vaping Use: Never used  Substance Use Topics   Alcohol use: No   Drug use: No     Allergies   Patient has no known allergies.   Review of Systems Review of Systems As per HPI  Physical Exam Triage Vital Signs ED Triage Vitals  Enc Vitals Group     BP 01/02/23 0901 119/74     Pulse Rate 01/02/23 0901 64     Resp 01/02/23 0901 16     Temp 01/02/23 0901 98.4 F (36.9 C)     Temp Source 01/02/23 0901 Oral     SpO2 01/02/23 0901 97 %     Weight --      Height --      Head Circumference --      Peak Flow --      Pain Score 01/02/23 0900 0     Pain Loc --      Pain Edu? --      Excl. in GC? --    No data found.  Updated Vital Signs BP 119/74 (BP Location: Right Arm)   Pulse 64   Temp 98.4 F (36.9 C) (Oral)   Resp 16   LMP 11/23/2022   SpO2 97%   Visual Acuity Right Eye  Distance:   Left Eye Distance:   Bilateral Distance:    Right Eye Near:   Left Eye Near:    Bilateral Near:     Physical Exam Vitals and nursing note reviewed.  Constitutional:      General: She is not in acute distress.    Appearance: Normal appearance. She is well-developed. She is not ill-appearing, toxic-appearing or diaphoretic.  HENT:     Head: Normocephalic and atraumatic.     Right Ear: External ear normal.     Left Ear: External ear normal.     Mouth/Throat:     Mouth: Mucous membranes are moist.  Eyes:     General: No scleral icterus.       Right eye: No discharge.        Left eye: No discharge.     Extraocular Movements: Extraocular movements intact.     Pupils: Pupils are equal, round, and reactive to light.  Cardiovascular:     Rate and Rhythm: Normal rate and regular rhythm.     Pulses: Normal pulses.     Heart sounds: Normal heart sounds. No murmur heard.    No friction rub. No gallop.  Pulmonary:     Effort: Pulmonary effort is normal. No respiratory distress.     Breath sounds: Normal breath sounds. No stridor. No wheezing, rhonchi or rales.  Chest:     Chest wall: No tenderness.  Abdominal:     Palpations: Abdomen is soft.     Tenderness: There is no abdominal tenderness.  Musculoskeletal:        General: No swelling.     Cervical back: Neck supple.  Skin:    General: Skin is warm and dry.     Capillary Refill: Capillary refill takes less than 2 seconds.     Coloration: Skin is not jaundiced.     Findings: No bruising, erythema or rash.  Neurological:     General: No focal deficit present.     Mental Status: She is alert and oriented to person, place, and time.  Psychiatric:        Mood and Affect:  Mood normal.      UC Treatments / Results  Labs (all labs ordered are listed, but only abnormal results are displayed) Labs Reviewed  POCT URINE PREGNANCY  CERVICOVAGINAL ANCILLARY ONLY    EKG   Radiology No results  found.  Procedures ED EKG  Date/Time: 01/02/2023 9:59 AM  Performed by: Maretta Bees, PA Authorized by: Maretta Bees, PA   Previous ECG:    Previous ECG:  Compared to current   Similarity:  No change Interpretation:    Interpretation: normal   Rate:    ECG rate assessment: normal   Rhythm:    Rhythm: sinus rhythm   Ectopy:    Ectopy: none   QRS:    QRS axis:  Normal   QRS intervals:  Normal   QRS conduction: normal   ST segments:    ST segments:  Normal T waves:    T waves: normal    (including critical care time)  Medications Ordered in UC Medications - No data to display  Initial Impression / Assessment and Plan / UC Course  I have reviewed the triage vital signs and the nursing notes.  Pertinent labs & imaging results that were available during my care of the patient were reviewed by me and considered in my medical decision making (see chart for details).     Screen for STD - Aptima swab collected.  Abnormal menses - pregnancy test is negative. Will refer to gyn if sx persist/ Other chest pain - VSS, EKG unremarkable. This appears to be muscular in nature. Consider referral to PT if sx persist. For now, trial of muscle relaxers, moist heat.  Muscle spasms - methocarbamol called in. Increase water intake, cut back on sodas.   Final Clinical Impressions(s) / UC Diagnoses   Final diagnoses:  Screen for STD (sexually transmitted disease)  Abnormal menses  Other chest pain  Muscle spasm of back     Discharge Instructions      Your EKG is normal.  Your blood pressure is perfect. Your symptoms sound like muscle spasms.  This is likely secondary to dehydration.  Please cut back on sodas and juices, and increase your water intake. I have given you a muscle relaxer to take on an as-needed basis.  You may take this up to 3 times daily.  Use with caution however as it may make you tired or drowsy.  Your pregnancy test is negative.  Please follow-up with  your gynecologist if your menstrual periods continue to be erratic.  We will call with results of your vaginal swab if any results are positive.  He will also be able to review them on MyChart.     ED Prescriptions     Medication Sig Dispense Auth. Provider   methocarbamol (ROBAXIN) 500 MG tablet Take 1 tablet (500 mg total) by mouth every 8 (eight) hours as needed for muscle spasms. 20 tablet Judas Mohammad L, [place]      PDMP not reviewed this encounter.   Maretta Bees, [place] 01/02/23 1106

## 2023-01-02 NOTE — ED Notes (Signed)
Pt adds that for several months had intermittent chest pains.

## 2023-01-03 LAB — CERVICOVAGINAL ANCILLARY ONLY
Bacterial Vaginitis (gardnerella): POSITIVE — AB
Candida Glabrata: NEGATIVE
Candida Vaginitis: NEGATIVE
Chlamydia: NEGATIVE
Comment: NEGATIVE
Comment: NEGATIVE
Comment: NEGATIVE
Comment: NEGATIVE
Comment: NEGATIVE
Comment: NORMAL
Neisseria Gonorrhea: NEGATIVE
Trichomonas: NEGATIVE

## 2023-01-06 ENCOUNTER — Telehealth: Payer: Self-pay

## 2023-01-06 MED ORDER — METRONIDAZOLE 500 MG PO TABS: 500.0000 mg | ORAL_TABLET | Freq: Two times a day (BID) | ORAL | 0 refills | Status: DC

## 2023-01-06 MED ORDER — FLUCONAZOLE 150 MG PO TABS: 150.0000 mg | ORAL_TABLET | Freq: Every day | ORAL | 0 refills | Status: DC

## 2023-01-06 NOTE — Telephone Encounter (Signed)
Per Guy Sandifer APP Rx Diflucan 150mg  sent to pharmacy on file per pt request.

## 2023-01-06 NOTE — Progress Notes (Signed)
TC to pt, ID verified. Advised pt per protocol Rx Metronidazole has been sent to pharmacy on file. Advised to not consume alcohol for duration of taking this medication, verbalizes understanding. Pt request Rx Diflucan to pharmacy on file - message sent to Guy Sandifer, APP for review.

## 2023-06-25 ENCOUNTER — Ambulatory Visit (HOSPITAL_COMMUNITY)
Admission: EM | Admit: 2023-06-25 | Discharge: 2023-06-25 | Disposition: A | Discharge status: Home / Self Care | Payer: Managed Care, Other (non HMO)

## 2023-06-25 NOTE — ED Notes (Signed)
Called patient from the lobby x 2. No answer or voice mail to leave a message.

## 2023-06-25 NOTE — ED Notes (Signed)
Called pt twice via phone and once from the waiting room. No answer

## 2023-07-06 ENCOUNTER — Ambulatory Visit (HOSPITAL_COMMUNITY)
Admission: EM | Admit: 2023-07-06 | Discharge: 2023-07-06 | Disposition: A | Discharge status: Home / Self Care | Payer: Managed Care, Other (non HMO) | Attending: Emergency Medicine | Admitting: Emergency Medicine

## 2023-07-06 ENCOUNTER — Encounter (HOSPITAL_COMMUNITY): Payer: Self-pay | Admitting: Emergency Medicine

## 2023-07-06 DIAGNOSIS — N939 Abnormal uterine and vaginal bleeding, unspecified: Secondary | ICD-10-CM | POA: Insufficient documentation

## 2023-07-06 LAB — POCT URINE PREGNANCY: Preg Test, Ur: NEGATIVE

## 2023-07-06 NOTE — ED Notes (Signed)
Urine in lab 

## 2023-07-06 NOTE — ED Triage Notes (Addendum)
Pt c/o vaginal bleeding and abdominal cramping. Blood is bright red. Pt states she just came of menstrual 06/23/23 but symptoms feel same as normal menstrual

## 2023-07-06 NOTE — ED Provider Notes (Signed)
[facility]    CSN: [id number] Arrival date & time: 07/06/23  1226      History   Chief Complaint Chief Complaint  Patient presents with   Vaginal Bleeding    HPI Maria Olson is a 32 y.o. female.  Abdominal cramping with vaginal bleeding started this morning. Filled one pad over 5-6 hours. Feels like her menstrual cycle. However she finished cycle 1 week ago, but it was shorter than usual No nausea or vomiting  She has been going through a lot recently, both mentally and physically  Past Medical History:  Diagnosis Date   Anemia    Anxiety    Chlamydia    Headache    Infection    UTI   Ovarian cyst    Preterm labor     Patient Active Problem List   Diagnosis Date Noted   Hx successful VBAC (vaginal birth after cesarean), currently pregnant 01/09/2016   Hx of fetal anomaly in prior pregnancy, currently pregnant 01/09/2016    Past Surgical History:  Procedure Laterality Date   CESAREAN SECTION     CESAREAN SECTION MULTI-GESTATIONAL  06/14/2016   Procedure: CESAREAN SECTION MULTI-GESTATIONAL;  Surgeon: Catalina Antigua, MD;  Location: [facility];  Service: Obstetrics;;   INDUCED ABORTION      OB History     Gravida  7   Para  4   Term  2   Preterm  2   AB  3   Living  5      SAB  1   IAB  2   Ectopic  0   Multiple  1   Live Births  5        Obstetric Comments  2017 del c/s for twins          Home Medications    Prior to Admission medications   Not on File    Family History Family History  Problem Relation Age of Onset   Healthy Mother    Asthma Brother    Asthma Daughter    Asthma Son     Social History Social History   Tobacco Use   Smoking status: Never   Smokeless tobacco: Never  Vaping Use   Vaping status: Never Used  Substance Use Topics   Alcohol use: No   Drug use: No     Allergies   Patient has no known allergies.   Review of Systems Review of Systems  Genitourinary:   Positive for vaginal bleeding.   Per HPI  Physical Exam Triage Vital Signs ED Triage Vitals  Encounter Vitals Group     BP 07/06/23 1356 122/83     Systolic BP Percentile --      Diastolic BP Percentile --      Pulse Rate 07/06/23 1356 (!) 57     Resp 07/06/23 1356 16     Temp 07/06/23 1356 98.3 F (36.8 C)     Temp Source 07/06/23 1356 Oral     SpO2 07/06/23 1356 98 %     Weight --      Height --      Head Circumference --      Peak Flow --      Pain Score 07/06/23 1401 4     Pain Loc --      Pain Education --      Exclude from Growth Chart --    No data found.  Updated Vital Signs BP 122/83 (BP Location: Left Arm)  Pulse (!) 57   Temp 98.3 F (36.8 C) (Oral)   Resp 16   LMP 06/23/2023 (Exact Date)   SpO2 98%   Physical Exam Vitals and nursing note reviewed.  Constitutional:      Appearance: Normal appearance.  HENT:     Mouth/Throat:     Mouth: Mucous membranes are moist.     Pharynx: Oropharynx is clear.  Eyes:     Conjunctiva/sclera: Conjunctivae normal.  Cardiovascular:     Rate and Rhythm: Normal rate and regular rhythm.     Heart sounds: Normal heart sounds.  Pulmonary:     Effort: Pulmonary effort is normal.     Breath sounds: Normal breath sounds.  Abdominal:     General: Bowel sounds are normal.     Palpations: Abdomen is soft.     Tenderness: There is no abdominal tenderness. There is no right CVA tenderness, left CVA tenderness, guarding or rebound.  Musculoskeletal:        General: Normal range of motion.  Skin:    General: Skin is warm and dry.  Neurological:     Mental Status: She is alert and oriented to person, place, and time.     UC Treatments / Results  Labs (all labs ordered are listed, but only abnormal results are displayed) Labs Reviewed  POCT URINE PREGNANCY  CERVICOVAGINAL ANCILLARY ONLY    EKG  Radiology No results found.  Procedures Procedures   Medications Ordered in UC Medications - No data to  display  Initial Impression / Assessment and Plan / UC Course  I have reviewed the triage vital signs and the nursing notes.  Pertinent labs & imaging results that were available during my care of the patient were reviewed by me and considered in my medical decision making (see chart for details).   UPT negative Symptoms began this morning. Discussed increased stress and changes may have cause early cycle or may be finishing original cycle. At this time no red flags, recommend to monitor symptoms over the next few days. Call ob/gyn to schedule follow up. Discussed to return if symptoms are persisting and cannot get in with women's clinic Patient agrees to plan, no questions at this time  Final Clinical Impressions(s) / UC Diagnoses   Final diagnoses:  Abnormal uterine bleeding (AUB)     Discharge Instructions      Call ob/gyn clinic for follow up!  Please return if symptoms are not resolving in the next week     ED Prescriptions   None    PDMP not reviewed this encounter.   Marlow Baars, [place] 07/06/23 1610

## 2023-07-06 NOTE — Discharge Instructions (Addendum)
Call ob/gyn clinic for follow up!  Please return if symptoms are not resolving in the next week

## 2023-07-08 LAB — CERVICOVAGINAL ANCILLARY ONLY
Chlamydia: NEGATIVE
Comment: NEGATIVE
Comment: NEGATIVE
Comment: NORMAL
Neisseria Gonorrhea: NEGATIVE
Trichomonas: NEGATIVE

## 2024-03-24 ENCOUNTER — Telehealth (HOSPITAL_COMMUNITY): Payer: Self-pay

## 2024-03-24 ENCOUNTER — Encounter (HOSPITAL_COMMUNITY): Payer: Self-pay | Admitting: Emergency Medicine

## 2024-03-24 ENCOUNTER — Ambulatory Visit (HOSPITAL_COMMUNITY)
Admission: EM | Admit: 2024-03-24 | Discharge: 2024-03-24 | Disposition: A | Discharge status: Home / Self Care | Payer: Self-pay | Attending: Internal Medicine | Admitting: Internal Medicine

## 2024-03-24 DIAGNOSIS — N898 Other specified noninflammatory disorders of vagina: Secondary | ICD-10-CM | POA: Insufficient documentation

## 2024-03-24 DIAGNOSIS — N760 Acute vaginitis: Secondary | ICD-10-CM | POA: Insufficient documentation

## 2024-03-24 DIAGNOSIS — Z3202 Encounter for pregnancy test, result negative: Secondary | ICD-10-CM | POA: Insufficient documentation

## 2024-03-24 LAB — HIV ANTIBODY (ROUTINE TESTING W REFLEX): HIV Screen 4th Generation wRfx: NONREACTIVE

## 2024-03-24 LAB — POCT URINE PREGNANCY: Preg Test, Ur: NEGATIVE

## 2024-03-24 MED ORDER — FLUCONAZOLE 150 MG PO TABS: 150.0000 mg | ORAL_TABLET | Freq: Once | ORAL | 0 refills | Status: AC

## 2024-03-24 MED ORDER — METRONIDAZOLE 500 MG PO TABS: 500.0000 mg | ORAL_TABLET | Freq: Two times a day (BID) | ORAL | 0 refills | Status: DC

## 2024-03-24 NOTE — Telephone Encounter (Signed)
 Pt called requesting Dilfucan for potential abx-associated yeast infection.  Rx sent to pharmacy on file per protocol.

## 2024-03-24 NOTE — ED Provider Notes (Signed)
 [facility]    CSN: [id number] Arrival date & time: 03/24/24  9060      History   Chief Complaint Chief Complaint  Patient presents with   Vaginal Pain    HPI Maria Olson is a 33 y.o. female.   Maria Olson is a 33 y.o. female presenting for chief complaint of vaginal irritation that started approximately 3 days ago.  She feels vaginal irritation and burning sensation to the external vaginal region after urination.  Reports recent new sexual partner (unprotected) and concern for possible STD.  States symptoms feel very similar to the last time she had BV/trichomonas.  Denies dysuria, urinary frequency, urinary urgency, and flank pain.  No nausea, vomiting, pelvic pressure, dyspareunia, dizziness, fever, chills, vaginal discharge, vaginal odor, or vaginal itching.  Denies vaginal rash/lesions. She changed her soap 1 week ago and wonders if this could be causing irritation.  No known exposures to STDs.  No history of HSV-1 or 2.  LMP 02-2022-2025, history of tubal ligation.  Denies chance of pregnancy.  She has tried using nystatin cream to vaginal region with minimal relief.    Vaginal Pain    Past Medical History:  Diagnosis Date   Anemia    Anxiety    Chlamydia    Headache    Infection    UTI   Ovarian cyst    Preterm labor     Patient Active Problem List   Diagnosis Date Noted   Hx successful VBAC (vaginal birth after cesarean), currently pregnant 01/09/2016   Hx of fetal anomaly in prior pregnancy, currently pregnant 01/09/2016    Past Surgical History:  Procedure Laterality Date   CESAREAN SECTION     CESAREAN SECTION MULTI-GESTATIONAL  06/14/2016   Procedure: CESAREAN SECTION MULTI-GESTATIONAL;  Surgeon: Winton Felt, MD;  Location: [facility];  Service: Obstetrics;;   INDUCED ABORTION      OB History     Gravida  7   Para  4   Term  2   Preterm  2   AB  3   Living  5      SAB  1   IAB  2   Ectopic  0    Multiple  1   Live Births  5        Obstetric Comments  2017 del c/s for twins          Home Medications    Prior to Admission medications   Medication Sig Start Date End Date Taking? Authorizing Provider  metroNIDAZOLE  (FLAGYL ) 500 MG tablet Take 1 tablet (500 mg total) by mouth 2 (two) times daily. 03/24/24  Yes Danis Pembleton, Dorna HERO, FNP    Family History Family History  Problem Relation Age of Onset   Healthy Mother    Asthma Brother    Asthma Daughter    Asthma Son     Social History Social History   Tobacco Use   Smoking status: Never   Smokeless tobacco: Never  Vaping Use   Vaping status: Never Used  Substance Use Topics   Alcohol use: No   Drug use: No     Allergies   Patient has no known allergies.   Review of Systems Review of Systems  Genitourinary:  Positive for vaginal pain.  Per HPI   Physical Exam Triage Vital Signs ED Triage Vitals  Encounter Vitals Group     BP 03/24/24 1002 122/81     Girls Systolic BP Percentile --  Girls Diastolic BP Percentile --      Boys Systolic BP Percentile --      Boys Diastolic BP Percentile --      Pulse Rate 03/24/24 1002 62     Resp 03/24/24 1002 16     Temp 03/24/24 1002 98.3 F (36.8 C)     Temp Source 03/24/24 1002 Oral     SpO2 03/24/24 1002 98 %     Weight --      Height --      Head Circumference --      Peak Flow --      Pain Score 03/24/24 1001 0     Pain Loc --      Pain Education --      Exclude from Growth Chart --    No data found.  Updated Vital Signs BP 122/81 (BP Location: Right Arm)   Pulse 62   Temp 98.3 F (36.8 C) (Oral)   Resp 16   LMP 03/10/2024 (Approximate)   SpO2 98%   Visual Acuity Right Eye Distance:   Left Eye Distance:   Bilateral Distance:    Right Eye Near:   Left Eye Near:    Bilateral Near:     Physical Exam Vitals and nursing note reviewed. Exam conducted with a chaperone present Alston Gull, NP student present for GU exam).   Constitutional:      Appearance: She is not ill-appearing or toxic-appearing.  HENT:     Head: Normocephalic and atraumatic.     Right Ear: Hearing and external ear normal.     Left Ear: Hearing and external ear normal.     Nose: Nose normal.     Mouth/Throat:     Lips: Pink.  Eyes:     General: Lids are normal. Vision grossly intact. Gaze aligned appropriately.     Extraocular Movements: Extraocular movements intact.     Conjunctiva/sclera: Conjunctivae normal.  Pulmonary:     Effort: Pulmonary effort is normal.  Genitourinary:    Exam position: Knee-chest position.     Labia:        Right: Lesion present. No tenderness or injury.        Left: No rash, tenderness, lesion or injury.      Vagina: No vaginal discharge, erythema or tenderness.   Musculoskeletal:     Cervical back: Neck supple.  Skin:    General: Skin is warm and dry.     Capillary Refill: Capillary refill takes less than 2 seconds.     Findings: No rash.  Neurological:     General: No focal deficit present.     Mental Status: She is alert and oriented to person, place, and time. Mental status is at baseline.     Cranial Nerves: No dysarthria or facial asymmetry.  Psychiatric:        Mood and Affect: Mood normal.        Speech: Speech normal.        Behavior: Behavior normal.        Thought Content: Thought content normal.        Judgment: Judgment normal.      UC Treatments / Results  Labs (all labs ordered are listed, but only abnormal results are displayed) Labs Reviewed  HSV 1/2 PCR (SURFACE)  HIV ANTIBODY (ROUTINE TESTING W REFLEX)  RPR  POCT URINE PREGNANCY  CERVICOVAGINAL ANCILLARY ONLY    EKG   Radiology No results found.  Procedures Procedures (including critical care time)  Medications Ordered in UC Medications - No data to display  Initial Impression / Assessment and Plan / UC Course  I have reviewed the triage vital signs and the nursing notes.  Pertinent labs & imaging  results that were available during my care of the patient were reviewed by me and considered in my medical decision making (see chart for details).   1. Acute vaginitis, vaginal irritation, urine pregnancy test negative, vaginal lesion High clinical suspicion for bacterial vaginosis, therefore will treat with Flagyl  empirically.   Suspicious lesion to the right labia minora is swabbed.  There is no pain with palpation or with swab, I have very low suspicion for HSV, however I would like to swab the lesion to ensure that this is not herpetic. Staff will call if HSV swab is abnormal and treat accordingly.  Discussed risks of disulfiram reaction, advised cessation of alcohol use during and for 72 hours after use of flagyl .  Vaginal swab pending, will treat for other positive infections per protocol when labs result. HIV and syphilis testing are also pending. Discussed tips to prevent recurrent BV infections. Safe sex precautions discussed.    Counseled patient on potential for adverse effects with medications prescribed/recommended today, strict ER and return-to-clinic precautions discussed, patient verbalized understanding.    Final Clinical Impressions(s) / UC Diagnoses   Final diagnoses:  Vaginal irritation  Acute vaginitis  Urine pregnancy test negative  Vaginal lesion     Discharge Instructions      Your evaluation suggests that you likely have bacterial vaginitis.  HSV testing is pending and will come back in 2-3 days. Staff will call if abnormal. I have a feeling the testing will be normal.   Your vaginal testing is pending and will come back in the next 2-3 days. You will receive a phone call from staff if you test positive for any other infections. We will go ahead and start treatment for BV. Take antibiotic as prescribed to treat this. If you were given flagyl  pills, do not drink alcohol during or for 48 hours after finishing antibiotic course.  Follow-up with OB/GYN or  return to urgent care as needed.       ED Prescriptions     Medication Sig Dispense Auth. Provider   metroNIDAZOLE  (FLAGYL ) 500 MG tablet Take 1 tablet (500 mg total) by mouth 2 (two) times daily. 14 tablet Enedelia Dorna HERO, FNP      PDMP not reviewed this encounter.   Enedelia Dorna HERO, [place] 03/24/24 1230

## 2024-03-24 NOTE — Medical Student Note (Signed)
 [facility] Insurance account manager Note For educational purposes for Medical, PA and NP students only and not part of the legal medical record.   CSN: [id number] Arrival date & time: 03/24/24  9060      History   Chief Complaint Chief Complaint  Patient presents with   Vaginal Pain    HPI Maria Olson is a 33 y.o. female.  Patient presents today for vaginal irritation for 3 days. Patient reports skin irritation after urination.  Patient reports using a new soap and having recent unprotected sex with a new partner. Patient states she has had BV before and this feels similar. She did try using some nystatin cream which helped a little. Denies vaginal discharge, changes in urine color or odor, chest pain, shortness of breath, nausea, vomiting, diarrhea, cough, fever, chills, abdominal pain, back pain.     Vaginal Pain    Past Medical History:  Diagnosis Date   Anemia    Anxiety    Chlamydia    Headache    Infection    UTI   Ovarian cyst    Preterm labor     Patient Active Problem List   Diagnosis Date Noted   Hx successful VBAC (vaginal birth after cesarean), currently pregnant 01/09/2016   Hx of fetal anomaly in prior pregnancy, currently pregnant 01/09/2016    Past Surgical History:  Procedure Laterality Date   CESAREAN SECTION     CESAREAN SECTION MULTI-GESTATIONAL  06/14/2016   Procedure: CESAREAN SECTION MULTI-GESTATIONAL;  Surgeon: Winton Felt, MD;  [facility]: [facility];  Service: Obstetrics;;   INDUCED ABORTION      OB History     Gravida  7   Para  4   Term  2   Preterm  2   AB  3   Living  5      SAB  1   IAB  2   Ectopic  0   Multiple  1   Live Births  5        Obstetric Comments  2017 del c/s for twins          Home Medications    Prior to Admission medications   Not on File    Family History Family History  Problem Relation Age of Onset   Healthy Mother    Asthma Brother    Asthma  Daughter    Asthma Son     Social History Social History   Tobacco Use   Smoking status: Never   Smokeless tobacco: Never  Vaping Use   Vaping status: Never Used  Substance Use Topics   Alcohol use: No   Drug use: No     Allergies   Patient has no known allergies.   Review of Systems Review of Systems  Genitourinary:  Positive for vaginal pain.   PER HPI  Physical Exam Updated Vital Signs BP 122/81 (BP Location: Right Arm)   Pulse 62   Temp 98.3 F (36.8 C) (Oral)   Resp 16   LMP 03/10/2024 (Approximate)   SpO2 98%   Physical Exam Vitals and nursing note reviewed. Exam conducted with a chaperone present.  Constitutional:      General: She is not in acute distress.    Appearance: Normal appearance.  HENT:     Right Ear: Tympanic membrane normal.     Left Ear: Tympanic membrane normal.     Mouth/Throat:     Mouth: Mucous membranes are moist.  Eyes:  Pupils: Pupils are equal, round, and reactive to light.  Cardiovascular:     Rate and Rhythm: Normal rate and regular rhythm.     Pulses: Normal pulses.     Heart sounds: Normal heart sounds.  Pulmonary:     Effort: Pulmonary effort is normal.     Breath sounds: Normal breath sounds.  Abdominal:     Palpations: Abdomen is soft.     Tenderness: There is no abdominal tenderness.  Genitourinary:    Labia:        Right: No tenderness.        Left: No tenderness.      Vagina: No vaginal discharge.      Comments: Mild skin irritation noted to bilateral labia. Small nontender clustered lesion swabbed during exam (area noted above).  Lymphadenopathy:     Lower Body: No right inguinal adenopathy. No left inguinal adenopathy.  Skin:    General: Skin is warm and dry.  Neurological:     Mental Status: She is alert.  Psychiatric:        Behavior: Behavior normal.      ED Treatments / Results  Labs (all labs ordered are listed, but only abnormal results are displayed) Labs Reviewed - No data to  display  EKG  Radiology No results found.  Procedures Procedures (including critical care time)  Medications Ordered in ED Medications - No data to display   Initial Impression / Assessment and Plan / ED Course  I have reviewed the triage vital signs and the nursing notes.  Pertinent labs & imaging results that were available during my care of the patient were reviewed by me and considered in my medical decision making (see chart for details).   1. Acute Vaginitis Symptoms are suspicious for Bacterial Vaginosis. Not suspicious for pregnancy, UTI, or yeast infection.   Patient performed Cervicovaginal swab for STI testing.  Patient agreeable to HIV and syphilis testing.  HSV swab obtained during chaperoned physical exam.  Flagyl  500 mg BID for 7 days prescribed for BV symptoms.  Discussed how to see test results and that the clinic will call with abnormal results.    Final Clinical Impressions(s) / ED Diagnoses   Final diagnoses:  None    New Prescriptions New Prescriptions   No medications on file

## 2024-03-24 NOTE — ED Triage Notes (Signed)
 Pt reports that changed soap and haas a new sexual partner. Pt reports has irritation after urinates, denies issues when urinating. Reports skin is irritated and I go in and wash up really good. Denies vaginal discharge or odors. Reports symptoms feel similar to when had BV before.

## 2024-03-24 NOTE — Discharge Instructions (Addendum)
 Your evaluation suggests that you likely have bacterial vaginitis.  HSV testing is pending and will come back in 2-3 days. Staff will call if abnormal. I have a feeling the testing will be normal.   Your vaginal testing is pending and will come back in the next 2-3 days. You will receive a phone call from staff if you test positive for any other infections. We will go ahead and start treatment for BV. Take antibiotic as prescribed to treat this. If you were given flagyl  pills, do not drink alcohol during or for 48 hours after finishing antibiotic course.  Follow-up with OB/GYN or return to urgent care as needed.

## 2024-03-25 ENCOUNTER — Ambulatory Visit: Payer: Self-pay

## 2024-03-25 LAB — CERVICOVAGINAL ANCILLARY ONLY
Bacterial Vaginitis (gardnerella): POSITIVE — AB
Candida Glabrata: NEGATIVE
Candida Vaginitis: NEGATIVE
Chlamydia: NEGATIVE
Comment: NEGATIVE
Comment: NEGATIVE
Comment: NEGATIVE
Comment: NEGATIVE
Comment: NEGATIVE
Comment: NORMAL
Neisseria Gonorrhea: NEGATIVE
Trichomonas: NEGATIVE

## 2024-03-25 LAB — RPR: RPR Ser Ql: NONREACTIVE

## 2024-03-25 LAB — HSV 1/2 PCR (SURFACE)
HSV-1 DNA: NOT DETECTED
HSV-2 DNA: NOT DETECTED

## 2024-04-23 ENCOUNTER — Encounter (HOSPITAL_COMMUNITY): Payer: Self-pay

## 2024-04-23 ENCOUNTER — Ambulatory Visit (HOSPITAL_COMMUNITY)
Admission: EM | Admit: 2024-04-23 | Discharge: 2024-04-23 | Disposition: A | Discharge status: Home / Self Care | Payer: Self-pay | Attending: Emergency Medicine | Admitting: Emergency Medicine

## 2024-04-23 DIAGNOSIS — U071 COVID-19: Secondary | ICD-10-CM

## 2024-04-23 LAB — POC COVID19/FLU A&B COMBO
Covid Antigen, POC: POSITIVE — AB
Influenza A Antigen, POC: NEGATIVE
Influenza B Antigen, POC: NEGATIVE

## 2024-04-23 NOTE — Discharge Instructions (Signed)
 You can use either ibuprofen  or tylenol  (or both) for aches/pains/fever Make sure you are drinking lots of water! Salt water gargles, lozenges, or throat spray Once daily allergy medicine (zyrtec, allegra) for runny nose and post-nasal drip You can also try mucinex  for congestion (with lots of water) Allow up to a week for improvement If symptoms worsening after that time period please return

## 2024-04-23 NOTE — ED Triage Notes (Signed)
 Pt c/o cough, congestion, and sinus pressure x2 days. Took tylenol  with no relief.

## 2024-04-23 NOTE — ED Provider Notes (Signed)
 [facility]    CSN: [id number] Arrival date & time: 04/23/24  1940      History   Chief Complaint Chief Complaint  Patient presents with   Cough    HPI Maria Olson is a 33 y.o. female.  Last night started runny nose, nasal congestion, cough, postnasal drip.  Feeling hot but no fever measured.  No chills.  She denies abdominal pain, nausea vomiting diarrhea, rash.  Unknown sick contacts.  She has kids at home who attends school. No recent travel No interventions yet.  Does not like taking medications  Past Medical History:  Diagnosis Date   Anemia    Anxiety    Chlamydia    Headache    Infection    UTI   Ovarian cyst    Preterm labor     Patient Active Problem List   Diagnosis Date Noted   Hx successful VBAC (vaginal birth after cesarean), currently pregnant 01/09/2016   Hx of fetal anomaly in prior pregnancy, currently pregnant 01/09/2016    Past Surgical History:  Procedure Laterality Date   CESAREAN SECTION     CESAREAN SECTION MULTI-GESTATIONAL  06/14/2016   Procedure: CESAREAN SECTION MULTI-GESTATIONAL;  Surgeon: Winton Felt, MD;  Location: [facility];  Service: Obstetrics;;   INDUCED ABORTION      OB History     Gravida  7   Para  4   Term  2   Preterm  2   AB  3   Living  5      SAB  1   IAB  2   Ectopic  0   Multiple  1   Live Births  5        Obstetric Comments  2017 del c/s for twins          Home Medications    Prior to Admission medications   Not on File    Family History Family History  Problem Relation Age of Onset   Healthy Mother    Asthma Brother    Asthma Daughter    Asthma Son     Social History Social History   Tobacco Use   Smoking status: Never   Smokeless tobacco: Never  Vaping Use   Vaping status: Never Used  Substance Use Topics   Alcohol use: No   Drug use: No     Allergies   Patient has no known allergies.   Review of Systems Review of  Systems Per HPI  Physical Exam Triage Vital Signs ED Triage Vitals [04/23/24 2006]  Encounter Vitals Group     BP 123/83     Girls Systolic BP Percentile      Girls Diastolic BP Percentile      Boys Systolic BP Percentile      Boys Diastolic BP Percentile      Pulse Rate 84     Resp 18     Temp 99 F (37.2 C)     Temp Source Oral     SpO2 96 %     Weight      Height      Head Circumference      Peak Flow      Pain Score 0     Pain Loc      Pain Education      Exclude from Growth Chart    No data found.  Updated Vital Signs BP 123/83 (BP Location: Right Arm)   Pulse 84   Temp  99 F (37.2 C) (Oral)   Resp 18   LMP 04/15/2024 (Approximate)   SpO2 96%    Physical Exam Vitals and nursing note reviewed.  Constitutional:      Appearance: She is not ill-appearing.  HENT:     Right Ear: Tympanic membrane and ear canal normal.     Left Ear: Tympanic membrane and ear canal normal.     Nose: No rhinorrhea.     Mouth/Throat:     Mouth: Mucous membranes are moist.     Pharynx: Oropharynx is clear. No posterior oropharyngeal erythema.  Eyes:     Conjunctiva/sclera: Conjunctivae normal.  Cardiovascular:     Rate and Rhythm: Normal rate and regular rhythm.     Pulses: Normal pulses.     Heart sounds: Normal heart sounds.  Pulmonary:     Effort: Pulmonary effort is normal.     Breath sounds: Normal breath sounds.  Abdominal:     Palpations: Abdomen is soft.     Tenderness: There is no abdominal tenderness.  Musculoskeletal:     Cervical back: Normal range of motion.  Lymphadenopathy:     Cervical: No cervical adenopathy.  Skin:    General: Skin is warm and dry.  Neurological:     Mental Status: She is alert and oriented to person, place, and time.      UC Treatments / Results  Labs (all labs ordered are listed, but only abnormal results are displayed) Labs Reviewed  POC COVID19/FLU A&B COMBO - Abnormal; Notable for the following components:      Result  Value   Covid Antigen, POC Positive (*)    All other components within normal limits    EKG   Radiology No results found.  Procedures Procedures (including critical care time)  Medications Ordered in UC Medications - No data to display  Initial Impression / Assessment and Plan / UC Course  I have reviewed the triage vital signs and the nursing notes.  Pertinent labs & imaging results that were available during my care of the patient were reviewed by me and considered in my medical decision making (see chart for details).  Afebrile in clinic, well-appearing Rapid COVID positive.  Rapid flu negative.  I discussed with patient supportive care at home, options are given for OTC medications.  Discussed likely prognosis.  Agrees to plan, no questions  Final Clinical Impressions(s) / UC Diagnoses   Final diagnoses:  Acute cough  COVID-19     Discharge Instructions      You can use either ibuprofen  or tylenol  (or both) for aches/pains/fever Make sure you are drinking lots of water! Salt water gargles, lozenges, or throat spray Once daily allergy medicine (zyrtec, allegra) for runny nose and post-nasal drip You can also try mucinex  for congestion (with lots of water) Allow up to a week for improvement If symptoms worsening after that time period please return     ED Prescriptions   None    PDMP not reviewed this encounter.   Amaro Mangold, PA-C 04/23/24 2052
